# Patient Record
Sex: Female | Born: 1990 | Race: White | Hispanic: No | Marital: Single | Smoking: Former smoker
Health system: Southern US, Community
[De-identification: ages and names within clinical notes are randomized; demographics above are authoritative.]

## PROBLEM LIST (undated history)

## (undated) ENCOUNTER — Emergency Department (HOSPITAL_COMMUNITY): Admission: EM | Payer: Self-pay | Source: Home / Self Care

## (undated) DIAGNOSIS — N898 Other specified noninflammatory disorders of vagina: Principal | ICD-10-CM

## (undated) DIAGNOSIS — O26891 Other specified pregnancy related conditions, first trimester: Principal | ICD-10-CM

## (undated) DIAGNOSIS — Z309 Encounter for contraceptive management, unspecified: Secondary | ICD-10-CM

## (undated) DIAGNOSIS — N760 Acute vaginitis: Secondary | ICD-10-CM

## (undated) DIAGNOSIS — B9689 Other specified bacterial agents as the cause of diseases classified elsewhere: Secondary | ICD-10-CM

## (undated) DIAGNOSIS — R87629 Unspecified abnormal cytological findings in specimens from vagina: Secondary | ICD-10-CM

## (undated) DIAGNOSIS — O219 Vomiting of pregnancy, unspecified: Secondary | ICD-10-CM

## (undated) DIAGNOSIS — A749 Chlamydial infection, unspecified: Secondary | ICD-10-CM

## (undated) DIAGNOSIS — Z3490 Encounter for supervision of normal pregnancy, unspecified, unspecified trimester: Secondary | ICD-10-CM

## (undated) HISTORY — PX: NO PAST SURGERIES: SHX2092

## (undated) HISTORY — PX: CERVICAL BIOPSY  W/ LOOP ELECTRODE EXCISION: SUR135

## (undated) HISTORY — DX: Encounter for supervision of normal pregnancy, unspecified, unspecified trimester: Z34.90

## (undated) HISTORY — DX: Unspecified abnormal cytological findings in specimens from vagina: R87.629

## (undated) HISTORY — DX: Encounter for contraceptive management, unspecified: Z30.9

## (undated) HISTORY — DX: Other specified pregnancy related conditions, first trimester: O26.891

## (undated) HISTORY — DX: Other specified noninflammatory disorders of vagina: N89.8

## (undated) HISTORY — DX: Other specified bacterial agents as the cause of diseases classified elsewhere: N76.0

## (undated) HISTORY — DX: Vomiting of pregnancy, unspecified: O21.9

## (undated) HISTORY — DX: Other specified bacterial agents as the cause of diseases classified elsewhere: B96.89

---
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
## 2011-02-20 ENCOUNTER — Encounter (HOSPITAL_COMMUNITY): Payer: Self-pay | Admitting: *Deleted

## 2011-02-20 ENCOUNTER — Emergency Department (HOSPITAL_COMMUNITY)
Admission: EM | Admit: 2011-02-20 | Discharge: 2011-02-20 | Disposition: A | Discharge status: Home / Self Care | Payer: Medicaid Other | Attending: Emergency Medicine | Admitting: Emergency Medicine

## 2011-02-20 DIAGNOSIS — N760 Acute vaginitis: Secondary | ICD-10-CM | POA: Insufficient documentation

## 2011-02-20 DIAGNOSIS — A499 Bacterial infection, unspecified: Secondary | ICD-10-CM | POA: Insufficient documentation

## 2011-02-20 DIAGNOSIS — B9689 Other specified bacterial agents as the cause of diseases classified elsewhere: Secondary | ICD-10-CM | POA: Insufficient documentation

## 2011-02-20 DIAGNOSIS — J069 Acute upper respiratory infection, unspecified: Secondary | ICD-10-CM

## 2011-02-20 DIAGNOSIS — F17200 Nicotine dependence, unspecified, uncomplicated: Secondary | ICD-10-CM | POA: Insufficient documentation

## 2011-02-20 HISTORY — DX: Chlamydial infection, unspecified: A74.9

## 2011-02-20 LAB — POCT PREGNANCY, URINE: Preg Test, Ur: NEGATIVE

## 2011-02-20 LAB — URINALYSIS, ROUTINE W REFLEX MICROSCOPIC
Leukocytes, UA: NEGATIVE
Protein, ur: NEGATIVE mg/dL
Urobilinogen, UA: 0.2 mg/dL (ref 0.0–1.0)

## 2011-02-20 MED ORDER — GUAIFENESIN-CODEINE 100-10 MG/5ML PO SYRP: 10.0000 mL | ORAL_SOLUTION | Freq: Three times a day (TID) | ORAL | Status: AC | PRN

## 2011-02-20 MED ORDER — METRONIDAZOLE 500 MG PO TABS: 500.0000 mg | ORAL_TABLET | Freq: Two times a day (BID) | ORAL | Status: AC

## 2011-02-20 NOTE — ED Notes (Signed)
Pt states sore throat x 3 days. Migraine x 2 days, nausea at times but no vomiting. Bilateral ear pain. Pt states she has been treated for chlamydia recently and was unable to keep antibiotics down. Pt states she still has symptoms. NAD at this time.

## 2011-02-20 NOTE — ED Notes (Signed)
Discharge instructions given and reviewed with patient.  Prescriptions given for Flagyl and Robitussin AC; effects and use explained for each.  Patient verbalized understanding to complete all antibiotic and possible sedating effects of cough medication.  Patient ambulatory with steady gait at triage.

## 2011-02-20 NOTE — ED Provider Notes (Signed)
History     CSN: [id number] Arrival date & time: 02/20/2011  4:23 PM   First MD Initiated Contact with Patient 02/20/11 1658      Chief Complaint  Patient presents with  . Multiple complaints    (Consider location/radiation/quality/duration/timing/severity/associated sxs/prior treatment) HPI Comments: Patient has multiple complaints.  She c/o sore throat, intermittent headaches, and intermittent nausea for several days.  She also states that she thinks she has a persistent chlamydia infection.  Reports having a chronic vaginal discharge for "weeks".  States she was treated here several months ago for same but symptoms never improved.  She denies fever or vomiting or abd pain  Patient is a 20 y.o. female presenting with pharyngitis. The history is provided by the patient.  Sore Throat This is a new problem. The problem occurs 2 to 4 times per day. The problem has been unchanged. Associated symptoms include congestion, headaches, myalgias, nausea and a sore throat. Pertinent negatives include no abdominal pain, arthralgias, chest pain, chills, coughing, fatigue, fever, neck pain, numbness, rash, swollen glands, urinary symptoms, vomiting or weakness. The symptoms are aggravated by swallowing. She has tried nothing for the symptoms. The treatment provided no relief.    Past Medical History  Diagnosis Date  . Chlamydia     History reviewed. No pertinent past surgical history.  Family History  Problem Relation Age of Onset  . Hypertension Mother     History  Substance Use Topics  . Smoking status: Current Everyday Smoker  . Smokeless tobacco: Not on file  . Alcohol Use: Yes     Occ    OB History    Grav Para Term Preterm Abortions TAB SAB Ect Mult Living   1 1 1       1       Review of Systems  Constitutional: Negative for fever, chills, activity change, appetite change and fatigue.  HENT: Positive for ear pain, congestion and sore throat. Negative for trouble swallowing,  neck pain and neck stiffness.   Eyes: Negative for pain and visual disturbance.  Respiratory: Negative for cough, shortness of breath and wheezing.   Cardiovascular: Negative for chest pain and palpitations.  Gastrointestinal: Positive for nausea. Negative for vomiting, abdominal pain, diarrhea, constipation and blood in stool.  Genitourinary: Positive for vaginal discharge. Negative for dysuria, urgency, hematuria, flank pain, vaginal bleeding, difficulty urinating, genital sores and vaginal pain.  Musculoskeletal: Positive for myalgias. Negative for back pain and arthralgias.  Skin: Negative.  Negative for rash.  Neurological: Positive for headaches. Negative for dizziness, weakness and numbness.  Hematological: Negative for adenopathy. Does not bruise/bleed easily.  All other systems reviewed and are negative.    Allergies  Review of patient's allergies indicates no known allergies.  Home Medications   Current Outpatient Rx  Name Route Sig Dispense Refill  . IBUPROFEN 200 MG PO TABS Oral Take 200 mg by mouth every 6 (six) hours as needed. For pain       BP 104/63  Pulse 92  Temp(Src) 98.5 F (36.9 C) (Oral)  Resp 17  Ht 5\' 6"  (1.676 m)  Wt 120 lb (54.432 kg)  BMI 19.37 kg/m2  SpO2 100%  LMP 02/10/2011  Physical Exam  Nursing note and vitals reviewed. Constitutional: She is oriented to person, place, and time. She appears well-developed and well-nourished. No distress.  HENT:  Head: Normocephalic and atraumatic.  Mouth/Throat: Oropharynx is clear and moist.  Neck: Normal range of motion. Neck supple.  Cardiovascular: Normal rate, regular rhythm and  normal heart sounds.   Pulmonary/Chest: Effort normal and breath sounds normal. No respiratory distress. She exhibits no tenderness.  Abdominal: Soft. She exhibits no distension. There is no tenderness.  Genitourinary: Uterus normal. Cervix exhibits no motion tenderness, no discharge and no friability. Right adnexum displays  no mass and no tenderness. Left adnexum displays no mass and no tenderness. No erythema, tenderness or bleeding around the vagina. No foreign body around the vagina. Vaginal discharge found.       Cervical os is slosed.  No CMT, mild to moderate milky discharge in the vaginal vault.  No vaginal bleeding  Musculoskeletal: Normal range of motion. She exhibits no edema and no tenderness.  Lymphadenopathy:    She has no cervical adenopathy.  Neurological: She is alert and oriented to person, place, and time. No cranial nerve deficit. She exhibits normal muscle tone. Coordination normal.  Skin: Skin is warm and dry.  Psychiatric: She has a normal mood and affect.    ED Course  Procedures (including critical care time)   Results for orders placed during the hospital encounter of 02/20/11  URINALYSIS, ROUTINE W REFLEX MICROSCOPIC      Component Value Range   Color, Urine YELLOW  YELLOW    Appearance CLEAR  CLEAR    Specific Gravity, Urine >1.030 (*) 1.005 - 1.030    pH 6.0  5.0 - 8.0    Glucose, UA NEGATIVE  NEGATIVE (mg/dL)   Hgb urine dipstick NEGATIVE  NEGATIVE    Bilirubin Urine NEGATIVE  NEGATIVE    Ketones, ur NEGATIVE  NEGATIVE (mg/dL)   Protein, ur NEGATIVE  NEGATIVE (mg/dL)   Urobilinogen, UA 0.2  0.0 - 1.0 (mg/dL)   Nitrite NEGATIVE  NEGATIVE    Leukocytes, UA NEGATIVE  NEGATIVE   POCT PREGNANCY, URINE      Component Value Range   Preg Test, Ur NEGATIVE    WET PREP, GENITAL      Component Value Range   Yeast, Wet Prep NONE SEEN  NONE SEEN    Trich, Wet Prep NONE SEEN  NONE SEEN    Clue Cells, Wet Prep FEW (*) NONE SEEN    WBC, Wet Prep HPF POC FEW (*) NONE SEEN         MDM     7:10 PM patient is alert, non-toxic appearing.  Abd is soft, NT.  No peritoneal signs.  GC and chlamydia culture pending. I have reviewed patient's medical records and previous GC and chlaymdia report.  Report was negative.  She agrees to f/u with GYN.  I have advised her that she will be  contacted if today's culture reports are positive.  I will treat her for BV today.    Patient / Family / Caregiver understand and agree with initial ED impression and plan with expectations set for ED visit.   The patient appears reasonably screened and/or stabilized for discharge and I doubt any other medical condition or other [facility] requiring further screening, evaluation, or treatment in the ED at this time prior to discharge.         Cortnee Steinmiller L. [place], [place] 02/25/11 2130

## 2011-02-28 NOTE — ED Provider Notes (Signed)
Medical screening examination/treatment/procedure(s) were performed by non-physician practitioner and as supervising physician I was immediately available for consultation/collaboration.  Donnetta Hutching, MD 02/28/11 [phone]

## 2012-06-23 ENCOUNTER — Emergency Department (HOSPITAL_COMMUNITY)
Admission: EM | Admit: 2012-06-23 | Discharge: 2012-06-23 | Disposition: A | Discharge status: Home / Self Care | Payer: Self-pay | Attending: Emergency Medicine | Admitting: Emergency Medicine

## 2012-06-23 ENCOUNTER — Encounter (HOSPITAL_COMMUNITY): Payer: Self-pay | Admitting: Emergency Medicine

## 2012-06-23 DIAGNOSIS — F17200 Nicotine dependence, unspecified, uncomplicated: Secondary | ICD-10-CM | POA: Insufficient documentation

## 2012-06-23 DIAGNOSIS — S0181XA Laceration without foreign body of other part of head, initial encounter: Secondary | ICD-10-CM

## 2012-06-23 DIAGNOSIS — S0180XA Unspecified open wound of other part of head, initial encounter: Secondary | ICD-10-CM | POA: Insufficient documentation

## 2012-06-23 DIAGNOSIS — Z8619 Personal history of other infectious and parasitic diseases: Secondary | ICD-10-CM | POA: Insufficient documentation

## 2012-06-23 MED ORDER — LIDOCAINE-EPINEPHRINE-TETRACAINE (LET) SOLUTION
NASAL | Status: AC
  Administered 2012-06-23: 3 mL
  Filled 2012-06-23: qty 3

## 2012-06-23 NOTE — ED Notes (Signed)
dermabond to bedside for bryant pa to use on pt.
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
## 2012-06-23 NOTE — ED Notes (Signed)
Pt out to desk multiple times for discharge papers. Pa/md notified. Walked out un-aided after receiving d/c papers.

## 2012-06-25 NOTE — ED Provider Notes (Signed)
Medical screening examination/treatment/procedure(s) were conducted as a shared visit with non-physician practitioner(s) and myself.  I personally evaluated the patient during the encounter.  Laceration repair by physician's assistant.  Otherwise patient evaluated by self.  Donnetta Hutching, MD 06/25/12 [phone]

## 2012-11-29 ENCOUNTER — Other Ambulatory Visit: Payer: Self-pay | Admitting: Obstetrics & Gynecology

## 2012-11-29 DIAGNOSIS — O3680X0 Pregnancy with inconclusive fetal viability, not applicable or unspecified: Secondary | ICD-10-CM

## 2012-12-04 ENCOUNTER — Ambulatory Visit (INDEPENDENT_AMBULATORY_CARE_PROVIDER_SITE_OTHER): Payer: Medicaid Other | Admitting: Adult Health

## 2012-12-04 ENCOUNTER — Ambulatory Visit (INDEPENDENT_AMBULATORY_CARE_PROVIDER_SITE_OTHER): Payer: Medicaid Other

## 2012-12-04 ENCOUNTER — Encounter: Payer: Self-pay | Admitting: Adult Health

## 2012-12-04 VITALS — BP 110/60 | Ht 65.0 in | Wt 112.0 lb

## 2012-12-04 DIAGNOSIS — O26849 Uterine size-date discrepancy, unspecified trimester: Secondary | ICD-10-CM

## 2012-12-04 DIAGNOSIS — B9689 Other specified bacterial agents as the cause of diseases classified elsewhere: Secondary | ICD-10-CM

## 2012-12-04 DIAGNOSIS — Z3480 Encounter for supervision of other normal pregnancy, unspecified trimester: Secondary | ICD-10-CM | POA: Insufficient documentation

## 2012-12-04 DIAGNOSIS — O26891 Other specified pregnancy related conditions, first trimester: Secondary | ICD-10-CM | POA: Insufficient documentation

## 2012-12-04 DIAGNOSIS — N898 Other specified noninflammatory disorders of vagina: Secondary | ICD-10-CM | POA: Insufficient documentation

## 2012-12-04 DIAGNOSIS — O2390 Unspecified genitourinary tract infection in pregnancy, unspecified trimester: Secondary | ICD-10-CM

## 2012-12-04 DIAGNOSIS — Z3490 Encounter for supervision of normal pregnancy, unspecified, unspecified trimester: Secondary | ICD-10-CM

## 2012-12-04 DIAGNOSIS — O219 Vomiting of pregnancy, unspecified: Secondary | ICD-10-CM

## 2012-12-04 DIAGNOSIS — N760 Acute vaginitis: Secondary | ICD-10-CM

## 2012-12-04 DIAGNOSIS — O3680X0 Pregnancy with inconclusive fetal viability, not applicable or unspecified: Secondary | ICD-10-CM

## 2012-12-04 DIAGNOSIS — O210 Mild hyperemesis gravidarum: Secondary | ICD-10-CM

## 2012-12-04 HISTORY — DX: Vomiting of pregnancy, unspecified: O21.9

## 2012-12-04 LAB — POCT URINALYSIS DIPSTICK
Blood, UA: NEGATIVE
Ketones, UA: NEGATIVE
Protein, UA: NEGATIVE

## 2012-12-04 LAB — POCT WET PREP (WET MOUNT)
Trichomonas Wet Prep HPF POC: NEGATIVE
WBC, Wet Prep HPF POC: POSITIVE

## 2012-12-04 MED ORDER — PROMETHAZINE HCL 25 MG PO TABS: 25.0000 mg | ORAL_TABLET | Freq: Four times a day (QID) | ORAL | Status: DC | PRN

## 2012-12-04 NOTE — Progress Notes (Signed)
U/S(9+0wks)-single IUP with +FCA noted, CRL c/w 9+0 wks EDD 07/09/2013, cx long and closed, bilateral adnexa wnl

## 2012-12-04 NOTE — Patient Instructions (Addendum)
Nausea and Vomiting Nausea is a sick feeling that often comes before throwing up (vomiting). Vomiting is a reflex where stomach contents come out of your mouth. Vomiting can cause severe loss of body fluids (dehydration). Children and elderly adults can become dehydrated quickly, especially if they also have diarrhea. Nausea and vomiting are symptoms of a condition or disease. It is important to find the cause of your symptoms. CAUSES   Direct irritation of the stomach lining. This irritation can result from increased acid production (gastroesophageal reflux disease), infection, food poisoning, taking certain medicines (such as nonsteroidal anti-inflammatory drugs), alcohol use, or tobacco use.  Signals from the brain.These signals could be caused by a headache, heat exposure, an inner ear disturbance, increased pressure in the brain from injury, infection, a tumor, or a concussion, pain, emotional stimulus, or metabolic problems.  An obstruction in the gastrointestinal tract (bowel obstruction).  Illnesses such as diabetes, hepatitis, gallbladder problems, appendicitis, kidney problems, cancer, sepsis, atypical symptoms of a heart attack, or eating disorders.  Medical treatments such as chemotherapy and radiation.  Receiving medicine that makes you sleep (general anesthetic) during surgery. DIAGNOSIS Your caregiver may ask for tests to be done if the problems do not improve after a few days. Tests may also be done if symptoms are severe or if the reason for the nausea and vomiting is not clear. Tests may include:  Urine tests.  Blood tests.  Stool tests.  Cultures (to look for evidence of infection).  X-rays or other imaging studies. Test results can help your caregiver make decisions about treatment or the need for additional tests. TREATMENT You need to stay well hydrated. Drink frequently but in small amounts.You may wish to drink water, sports drinks, clear broth, or eat frozen  ice pops or gelatin dessert to help stay hydrated.When you eat, eating slowly may help prevent nausea.There are also some antinausea medicines that may help prevent nausea. HOME CARE INSTRUCTIONS   Take all medicine as directed by your caregiver.  If you do not have an appetite, do not force yourself to eat. However, you must continue to drink fluids.  If you have an appetite, eat a normal diet unless your caregiver tells you differently.  Eat a variety of complex carbohydrates (rice, wheat, potatoes, bread), lean meats, yogurt, fruits, and vegetables.  Avoid high-fat foods because they are more difficult to digest.  Drink enough water and fluids to keep your urine clear or pale yellow.  If you are dehydrated, ask your caregiver for specific rehydration instructions. Signs of dehydration may include:  Severe thirst.  Dry lips and mouth.  Dizziness.  Dark urine.  Decreasing urine frequency and amount.  Confusion.  Rapid breathing or pulse. SEEK IMMEDIATE MEDICAL CARE IF:   You have blood or brown flecks (like coffee grounds) in your vomit.  You have black or bloody stools.  You have a severe headache or stiff neck.  You are confused.  You have severe abdominal pain.  You have chest pain or trouble breathing.  You do not urinate at least once every 8 hours.  You develop cold or clammy skin.  You continue to vomit for longer than 24 to 48 hours.  You have a fever. MAKE SURE YOU:   Understand these instructions.  Will watch your condition.  Will get help right away if you are not doing well or get worse. Document Released: 04/17/2005 Document Revised: 07/10/2011 Document Reviewed: 09/14/2010 [facility] Patient Information 2014 [place], [place]. Bacterial Vaginosis Bacterial vaginosis (BV)  is a vaginal infection where the normal balance of bacteria in the vagina is disrupted. The normal balance is then replaced by an overgrowth of certain bacteria. There are  several different kinds of bacteria that can cause BV. BV is the most common vaginal infection in women of childbearing age. CAUSES   The cause of BV is not fully understood. BV develops when there is an increase or imbalance of harmful bacteria.  Some activities or behaviors can upset the normal balance of bacteria in the vagina and put women at increased risk including:  Having a new sex partner or multiple sex partners.  Douching.  Using an intrauterine device (IUD) for contraception.  It is not clear what role sexual activity plays in the development of BV. However, women that have never had sexual intercourse are rarely infected with BV. Women do not get BV from toilet seats, bedding, swimming pools or from touching objects around them.  SYMPTOMS   Grey vaginal discharge.  A fish-like odor with discharge, especially after sexual intercourse.  Itching or burning of the vagina and vulva.  Burning or pain with urination.  Some women have no signs or symptoms at all. DIAGNOSIS  Your caregiver must examine the vagina for signs of BV. Your caregiver will perform lab tests and look at the sample of vaginal fluid through a microscope. They will look for bacteria and abnormal cells (clue cells), a pH test higher than 4.5, and a positive amine test all associated with BV.  RISKS AND COMPLICATIONS   Pelvic inflammatory disease (PID).  Infections following gynecology surgery.  Developing HIV.  Developing herpes virus. TREATMENT  Sometimes BV will clear up without treatment. However, all women with symptoms of BV should be treated to avoid complications, especially if gynecology surgery is planned. Female partners generally do not need to be treated. However, BV may spread between female sex partners so treatment is helpful in preventing a recurrence of BV.   BV may be treated with antibiotics. The antibiotics come in either pill or vaginal cream forms. Either can be used with  nonpregnant or pregnant women, but the recommended dosages differ. These antibiotics are not harmful to the baby.  BV can recur after treatment. If this happens, a second round of antibiotics will often be prescribed.  Treatment is important for pregnant women. If not treated, BV can cause a premature delivery, especially for a pregnant woman who had a premature birth in the past. All pregnant women who have symptoms of BV should be checked and treated.  For chronic reoccurrence of BV, treatment with a type of prescribed gel vaginally twice a week is helpful. HOME CARE INSTRUCTIONS   Finish all medication as directed by your caregiver.  Do not have sex until treatment is completed.  Tell your sexual partner that you have a vaginal infection. They should see their caregiver and be treated if they have problems, such as a mild rash or itching.  Practice safe sex. Use condoms. Only have 1 sex partner. PREVENTION  Basic prevention steps can help reduce the risk of upsetting the natural balance of bacteria in the vagina and developing BV:  Do not have sexual intercourse (be abstinent).  Do not douche.  Use all of the medicine prescribed for treatment of BV, even if the signs and symptoms go away.  Tell your sex partner if you have BV. That way, they can be treated, if needed, to prevent reoccurrence. SEEK MEDICAL CARE IF:   Your symptoms are  not improving after 3 days of treatment.  You have increased discharge, pain, or fever. MAKE SURE YOU:   Understand these instructions.  Will watch your condition.  Will get help right away if you are not doing well or get worse. FOR MORE INFORMATION  Division of STD Prevention (DSTDP), Centers for Disease Control and Prevention: [employer] American Social Health Association (ASHA): www.ashastd.org  Document Released: 04/17/2005 Document Revised: 07/10/2011 Document Reviewed: 10/08/2008 [facility] Patient Information 2014 [place],  [place]. Pregnancy - First Trimester During sexual intercourse, millions of sperm go into the vagina. Only 1 sperm will penetrate and fertilize the female egg while it is in the Fallopian tube. One week later, the fertilized egg implants into the wall of the uterus. An embryo begins to develop into a baby. At 6 to 8 weeks, the eyes and face are formed and the heartbeat can be seen on ultrasound. At the end of 12 weeks (first trimester), all the baby's organs are formed. Now that you are pregnant, you will want to do everything you can to have a healthy baby. Two of the most important things are to get good prenatal care and follow your caregiver's instructions. Prenatal care is all the medical care you receive before the baby's birth. It is given to prevent, find, and treat problems during the pregnancy and childbirth. PRENATAL EXAMS  During prenatal visits, your weight, blood pressure, and urine are checked. This is done to make sure you are healthy and progressing normally during the pregnancy.  A pregnant woman should gain 25 to 35 pounds during the pregnancy. However, if you are overweight or underweight, your caregiver will advise you regarding your weight.  Your caregiver will ask and answer questions for you.  Blood work, cervical cultures, other necessary tests, and a Pap test are done during your prenatal exams. These tests are done to check on your health and the probable health of your baby. Tests are strongly recommended and done for HIV with your permission. This is the virus that causes AIDS. These tests are done because medicines can be given to help prevent your baby from being born with this infection should you have been infected without knowing it. Blood work is also used to find out your blood type, previous infections, and follow your blood levels (hemoglobin).  Low hemoglobin (anemia) is common during pregnancy. Iron and vitamins are given to help prevent this. Later in the pregnancy,  blood tests for diabetes will be done along with any other tests if any problems develop.  You may need other tests to make sure you and the baby are doing well. CHANGES DURING THE FIRST TRIMESTER  Your body goes through many changes during pregnancy. They vary from person to person. Talk to your caregiver about changes you notice and are concerned about. Changes can include:  Your menstrual period stops.  The egg and sperm carry the genes that determine what you look like. Genes from you and your partner are forming a baby. The female genes determine whether the baby is a boy or a girl.  Your body increases in girth and you may feel bloated.  Feeling sick to your stomach (nauseous) and throwing up (vomiting). If the vomiting is uncontrollable, call your caregiver.  Your breasts will begin to enlarge and become tender.  Your nipples may stick out more and become darker.  The need to urinate more. Painful urination may mean you have a bladder infection.  Tiring easily.  Loss of appetite.  Cravings  for certain kinds of food.  At first, you may gain or lose a couple of pounds.  You may have changes in your emotions from day to day (excited to be pregnant or concerned something may go wrong with the pregnancy and baby).  You may have more vivid and strange dreams. HOME CARE INSTRUCTIONS   It is very important to avoid all smoking, alcohol and non-prescribed drugs during your pregnancy. These affect the formation and growth of the baby. Avoid chemicals while pregnant to ensure the delivery of a healthy infant.  Start your prenatal visits by the 12th week of pregnancy. They are usually scheduled monthly at first, then more often in the last 2 months before delivery. Keep your caregiver's appointments. Follow your caregiver's instructions regarding medicine use, blood and lab tests, exercise, and diet.  During pregnancy, you are providing food for you and your baby. Eat regular,  well-balanced meals. Choose foods such as meat, fish, milk and other low fat dairy products, vegetables, fruits, and whole-grain breads and cereals. Your caregiver will tell you of the ideal weight gain.  You can help morning sickness by keeping soda crackers at the bedside. Eat a couple before arising in the morning. You may want to use the crackers without salt on them.  Eating 4 to 5 small meals rather than 3 large meals a day also may help the nausea and vomiting.  Drinking liquids between meals instead of during meals also seems to help nausea and vomiting.  A physical sexual relationship may be continued throughout pregnancy if there are no other problems. Problems may be early (premature) leaking of amniotic fluid from the membranes, vaginal bleeding, or belly (abdominal) pain.  Exercise regularly if there are no restrictions. Check with your caregiver or physical therapist if you are unsure of the safety of some of your exercises. Greater weight gain will occur in the last 2 trimesters of pregnancy. Exercising will help:  Control your weight.  Keep you in shape.  Prepare you for labor and delivery.  Help you lose your pregnancy weight after you deliver your baby.  Wear a good support or jogging bra for breast tenderness during pregnancy. This may help if worn during sleep too.  Ask when prenatal classes are available. Begin classes when they are offered.  Do not use hot tubs, steam rooms, or saunas.  Wear your seat belt when driving. This protects you and your baby if you are in an accident.  Avoid raw meat, uncooked cheese, cat litter boxes, and soil used by cats throughout the pregnancy. These carry germs that can cause birth defects in the baby.  The first trimester is a good time to visit your dentist for your dental health. Getting your teeth cleaned is okay. Use a softer toothbrush and brush gently during pregnancy.  Ask for help if you have financial, counseling, or  nutritional needs during pregnancy. Your caregiver will be able to offer counseling for these needs as well as refer you for other special needs.  Do not take any medicines or herbs unless told by your caregiver.  Inform your caregiver if there is any mental or physical domestic violence.  Make a list of emergency phone numbers of family, friends, hospital, and police and fire departments.  Write down your questions. Take them to your prenatal visit.  Do not douche.  Do not cross your legs.  If you have to stand for long periods of time, rotate you feet or take small steps in  a circle.  You may have more vaginal secretions that may require a sanitary pad. Do not use tampons or scented sanitary pads. MEDICINES AND DRUG USE IN PREGNANCY  Take prenatal vitamins as directed. The vitamin should contain 1 milligram of folic acid. Keep all vitamins out of reach of children. Only a couple vitamins or tablets containing iron may be fatal to a baby or young child when ingested.  Avoid use of all medicines, including herbs, over-the-counter medicines, not prescribed or suggested by your caregiver. Only take over-the-counter or prescription medicines for pain, discomfort, or fever as directed by your caregiver. Do not use aspirin, ibuprofen, or naproxen unless directed by your caregiver.  Let your caregiver also know about herbs you may be using.  Alcohol is related to a number of birth defects. This includes fetal alcohol syndrome. All alcohol, in any form, should be avoided completely. Smoking will cause low birth rate and premature babies.  Street or illegal drugs are very harmful to the baby. They are absolutely forbidden. A baby born to an addicted mother will be addicted at birth. The baby will go through the same withdrawal an adult does.  Let your caregiver know about any medicines that you have to take and for what reason you take them. SEEK MEDICAL CARE IF:  You have any concerns or  worries during your pregnancy. It is better to call with your questions if you feel they cannot wait, rather than worry about them. SEEK IMMEDIATE MEDICAL CARE IF:   An unexplained oral temperature above 102 F (38.9 C) develops, or as your caregiver suggests.  You have leaking of fluid from the vagina (birth canal). If leaking membranes are suspected, take your temperature and inform your caregiver of this when you call.  There is vaginal spotting or bleeding. Notify your caregiver of the amount and how many pads are used.  You develop a bad smelling vaginal discharge with a change in the color.  You continue to feel sick to your stomach (nauseated) and have no relief from remedies suggested. You vomit blood or coffee ground-like materials.  You lose more than 2 pounds of weight in 1 week.  You gain more than 2 pounds of weight in 1 week and you notice swelling of your face, hands, feet, or legs.  You gain 5 pounds or more in 1 week (even if you do not have swelling of your hands, face, legs, or feet).  You get exposed to [place] measles and have never had them.  You are exposed to fifth disease or chickenpox.  You develop belly (abdominal) pain. Round ligament discomfort is a common non-cancerous (benign) cause of abdominal pain in pregnancy. Your caregiver still must evaluate this.  You develop headache, fever, diarrhea, pain with urination, or shortness of breath.  You fall or are in a car accident or have any kind of trauma.  There is mental or physical violence in your home. Document Released: 04/11/2001 Document Revised: 01/10/2012 Document Reviewed: 10/13/2008 [facility] Patient Information 2014 [place], [place]. Eat often Try phenergan Return in 1 week for new oB

## 2012-12-04 NOTE — Progress Notes (Signed)
Subjective:     Patient ID: Jennifer [name], female   DOB: [birth year], 22 y.o.   MRN: [id number]  HPI Jennifer [name] is a 22 year old white female here for dating [place] and complains of vaginal discharge and N/V.She has a history of BV.The [place] showed her to be [redacted] weeks pregnant with EDD of 07/09/13.  Review of Systems Positives in HPI Reviewed past medical,surgical, social and family history. Reviewed medications and allergies.     Objective:   Physical Exam BP 110/60  Ht 5\' 5"  (1.651 m)  Wt 112 lb (50.803 kg)  BMI 18.64 kg/m2  LMP 10/20/2012   Urine dipstick negative, Skin warm and dry.Pelvic: external genitalia is normal in appearance, vagina: white frothy discharge with odor, cervix:smooth and bulbous, uterus: 9 week size,  non tender, adnexa: no masses or tenderness noted. Wet prep: + for clue cells and +WBCs. GC/CHL obtained.  Assessment:     Vaginal discharge in first trimester Nausea and vomiting  BV   Pregnant  Plan:      RX Phenergan 25 mg #30 1 every 6 hours prn N/V with 1 refill Return in 1 week for new ob visit Will treat BV after 12 weeks Review handouts on first trimester,BV and N/V

## 2012-12-16 ENCOUNTER — Encounter: Payer: Self-pay | Admitting: Adult Health

## 2012-12-16 ENCOUNTER — Ambulatory Visit (INDEPENDENT_AMBULATORY_CARE_PROVIDER_SITE_OTHER): Payer: Medicaid Other | Admitting: Adult Health

## 2012-12-16 VITALS — BP 96/60 | Wt 112.0 lb

## 2012-12-16 DIAGNOSIS — Z331 Pregnant state, incidental: Secondary | ICD-10-CM

## 2012-12-16 DIAGNOSIS — Z3481 Encounter for supervision of other normal pregnancy, first trimester: Secondary | ICD-10-CM

## 2012-12-16 DIAGNOSIS — Z3490 Encounter for supervision of normal pregnancy, unspecified, unspecified trimester: Secondary | ICD-10-CM

## 2012-12-16 DIAGNOSIS — Z3480 Encounter for supervision of other normal pregnancy, unspecified trimester: Secondary | ICD-10-CM

## 2012-12-16 DIAGNOSIS — Z1389 Encounter for screening for other disorder: Secondary | ICD-10-CM

## 2012-12-16 LAB — POCT URINALYSIS DIPSTICK
Blood, UA: NEGATIVE
Glucose, UA: NEGATIVE
Nitrite, UA: NEGATIVE

## 2012-12-16 LAB — CBC
MCHC: 34 g/dL (ref 30.0–36.0)
Platelets: 200 10*3/uL (ref 150–400)
RDW: 13.5 % (ref 11.5–15.5)

## 2012-12-16 NOTE — Progress Notes (Signed)
  Subjective:    Jennifer [name] is a 22 y.o. G34P1001 Caucasian female at [redacted]w[redacted]d by [place] being seen today for her first obstetrical visit.  Her obstetrical history is significant for BV. Will treat after 12 weeks. Pregnancy history fully reviewed.   Patient reports nausea.Has phenergan but using it less  Filed Vitals:   12/16/12 1438  BP: 96/60  Weight: 112 lb (50.803 kg)    HISTORY: OB History  Gravida Para Term Preterm AB SAB TAB Ectopic Multiple Living  2 1 1       1     # Outcome Date GA Lbr Len/2nd Weight Sex Delivery Anes PTL Lv  2 CUR           1 TRM 10/29/08 [redacted]w[redacted]d  8 lb 0.1 oz (3.632 kg) F SVD   Y     Past Medical History  Diagnosis Date  . Chlamydia   . BV (bacterial vaginosis)   . Nausea and vomiting in pregnancy prior to [redacted] weeks gestation 12/04/2012  . Vaginal discharge in pregnancy in first trimester 12/04/2012  . Pregnant 12/04/2012   History reviewed. No pertinent past surgical history. Family History  Problem Relation Age of Onset  . Hypertension Mother      Exam    Pelvic Exam:    Perineum: deferred   Vulva: deferred   Vagina:  deferred   Uterus   11 weeks     Cervix: normaldeferred   Adnexa: Not palpable   Urinary: deferred    System:     Skin: normal coloration and turgor, no rashes    Neurologic: oriented, normal mood   Extremities: normal strength, tone, and muscle mass   HEENT PERRLA   Mouth/Teeth mucous membranes moist   Cardiovascular: regular rate and rhythm   Respiratory:  appears well, vitals normal, no respiratory distress, acyanotic, normal RR   Abdomen: soft, non-tender       Assessment:    Pregnancy: G2P1001 Patient Active Problem List   Diagnosis Date Noted  . Nausea and vomiting in pregnancy prior to [redacted] weeks gestation 12/04/2012  . Vaginal discharge in pregnancy in first trimester 12/04/2012  . BV (bacterial vaginosis) 12/04/2012  . Pregnant 12/04/2012      [redacted]w[redacted]d G2P1001 New OB visit    Plan:     Initial labs  drawn Continue prenatal vitamins Problem list reviewed and updated Reviewed n/v relief measures and warning s/s to report Reviewed recommended weight gain based on pre-gravid BMI Encouraged well-balanced diet Genetic Screening discussed Integrated Screen: requested Cystic fibrosis screening discussed requested Ultrasound discussed; fetal survey: requested Follow up in 2 weeks fot IT/NT and see Kim and treat for BV then.  Jordanne Elsbury 12/16/2012 3:42 PM

## 2012-12-16 NOTE — Progress Notes (Signed)
Pt here today for New OB appointment. Pt states she is having pain in her lower left side/abdomin. Pt denies any other problems or concerns at this time. Pt given CCNC form and lab consents to read over and sign.

## 2012-12-16 NOTE — Patient Instructions (Addendum)
Pregnancy - Second Trimester The second trimester of pregnancy (3 to 6 months) is a period of rapid growth for you and your baby. At the end of the sixth month, your baby is about 9 inches long and weighs 1 1/2 pounds. You will begin to feel the baby move between 18 and 20 weeks of the pregnancy. This is called quickening. Weight gain is faster. A clear fluid (colostrum) may leak out of your breasts. You may feel small contractions of the womb (uterus). This is known as false labor or Braxton-Hicks contractions. This is like a practice for labor when the baby is ready to be born. Usually, the problems with morning sickness have usually passed by the end of your first trimester. Some women develop small dark blotches (called cholasma, mask of pregnancy) on their face that usually goes away after the baby is born. Exposure to the sun makes the blotches worse. Acne may also develop in some pregnant women and pregnant women who have acne, may find that it goes away. PRENATAL EXAMS  Blood work may continue to be done during prenatal exams. These tests are done to check on your health and the probable health of your baby. Blood work is used to follow your blood levels (hemoglobin). Anemia (low hemoglobin) is common during pregnancy. Iron and vitamins are given to help prevent this. You will also be checked for diabetes between 24 and 28 weeks of the pregnancy. Some of the previous blood tests may be repeated.  The size of the uterus is measured during each visit. This is to make sure that the baby is continuing to grow properly according to the dates of the pregnancy.  Your blood pressure is checked every prenatal visit. This is to make sure you are not getting toxemia.  Your urine is checked to make sure you do not have an infection, diabetes or protein in the urine.  Your weight is checked often to make sure gains are happening at the suggested rate. This is to ensure that both you and your baby are growing  normally.  Sometimes, an ultrasound is performed to confirm the proper growth and development of the baby. This is a test which bounces harmless sound waves off the baby so your caregiver can more accurately determine due dates. Sometimes, a test is done on the amniotic fluid surrounding the baby. This test is called an amniocentesis. The amniotic fluid is obtained by sticking a needle into the belly (abdomen). This is done to check the chromosomes in instances where there is a concern about possible genetic problems with the baby. It is also sometimes done near the end of pregnancy if an early delivery is required. In this case, it is done to help make sure the baby's lungs are mature enough for the baby to live outside of the womb. CHANGES OCCURING IN THE SECOND TRIMESTER OF PREGNANCY Your body goes through many changes during pregnancy. They vary from person to person. Talk to your caregiver about changes you notice that you are concerned about.  During the second trimester, you will likely have an increase in your appetite. It is normal to have cravings for certain foods. This varies from person to person and pregnancy to pregnancy.  Your lower abdomen will begin to bulge.  You may have to urinate more often because the uterus and baby are pressing on your bladder. It is also common to get more bladder infections during pregnancy. You can help this by drinking lots of fluids  and emptying your bladder before and after intercourse.  You may begin to get stretch marks on your hips, abdomen, and breasts. These are normal changes in the body during pregnancy. There are no exercises or medicines to take that prevent this change.  You may begin to develop swollen and bulging veins (varicose veins) in your legs. Wearing support hose, elevating your feet for 15 minutes, 3 to 4 times a day and limiting salt in your diet helps lessen the problem.  Heartburn may develop as the uterus grows and pushes up  against the stomach. Antacids recommended by your caregiver helps with this problem. Also, eating smaller meals 4 to 5 times a day helps.  Constipation can be treated with a stool softener or adding bulk to your diet. Drinking lots of fluids, and eating vegetables, fruits, and whole grains are helpful.  Exercising is also helpful. If you have been very active up until your pregnancy, most of these activities can be continued during your pregnancy. If you have been less active, it is helpful to start an exercise program such as walking.  Hemorrhoids may develop at the end of the second trimester. Warm sitz baths and hemorrhoid cream recommended by your caregiver helps hemorrhoid problems.  Backaches may develop during this time of your pregnancy. Avoid heavy lifting, wear low heal shoes, and practice good posture to help with backache problems.  Some pregnant women develop tingling and numbness of their hand and fingers because of swelling and tightening of ligaments in the wrist (carpel tunnel syndrome). This goes away after the baby is born.  As your breasts enlarge, you may have to get a bigger bra. Get a comfortable, cotton, support bra. Do not get a nursing bra until the last month of the pregnancy if you will be nursing the baby.  You may get a dark line from your belly button to the pubic area called the linea nigra.  You may develop rosy cheeks because of increase blood flow to the face.  You may develop spider looking lines of the face, neck, arms, and chest. These go away after the baby is born. HOME CARE INSTRUCTIONS   It is extremely important to avoid all smoking, herbs, alcohol, and unprescribed drugs during your pregnancy. These chemicals affect the formation and growth of the baby. Avoid these chemicals throughout the pregnancy to ensure the delivery of a healthy infant.  Most of your home care instructions are the same as suggested for the first trimester of your pregnancy.  Keep your caregiver's appointments. Follow your caregiver's instructions regarding medicine use, exercise, and diet.  During pregnancy, you are providing food for you and your baby. Continue to eat regular, well-balanced meals. Choose foods such as meat, fish, milk and other low fat dairy products, vegetables, fruits, and whole-grain breads and cereals. Your caregiver will tell you of the ideal weight gain.  A physical sexual relationship may be continued up until near the end of pregnancy if there are no other problems. Problems could include early (premature) leaking of amniotic fluid from the membranes, vaginal bleeding, abdominal pain, or other medical or pregnancy problems.  Exercise regularly if there are no restrictions. Check with your caregiver if you are unsure of the safety of some of your exercises. The greatest weight gain will occur in the last 2 trimesters of pregnancy. Exercise will help you:  Control your weight.  Get you in shape for labor and delivery.  Lose weight after you have the baby.  Wear  a good support or jogging bra for breast tenderness during pregnancy. This may help if worn during sleep. Pads or tissues may be used in the bra if you are leaking colostrum.  Do not use hot tubs, steam rooms or saunas throughout the pregnancy.  Wear your seat belt at all times when driving. This protects you and your baby if you are in an accident.  Avoid raw meat, uncooked cheese, cat litter boxes, and soil used by cats. These carry germs that can cause birth defects in the baby.  The second trimester is also a good time to visit your dentist for your dental health if this has not been done yet. Getting your teeth cleaned is okay. Use a soft toothbrush. Brush gently during pregnancy.  It is easier to leak urine during pregnancy. Tightening up and strengthening the pelvic muscles will help with this problem. Practice stopping your urination while you are going to the bathroom.  These are the same muscles you need to strengthen. It is also the muscles you would use as if you were trying to stop from passing gas. You can practice tightening these muscles up 10 times a set and repeating this about 3 times per day. Once you know what muscles to tighten up, do not perform these exercises during urination. It is more likely to contribute to an infection by backing up the urine.  Ask for help if you have financial, counseling, or nutritional needs during pregnancy. Your caregiver will be able to offer counseling for these needs as well as refer you for other special needs.  Your skin may become oily. If so, wash your face with mild soap, use non-greasy moisturizer and oil or cream based makeup. MEDICINES AND DRUG USE IN PREGNANCY  Take prenatal vitamins as directed. The vitamin should contain 1 milligram of folic acid. Keep all vitamins out of reach of children. Only a couple vitamins or tablets containing iron may be fatal to a baby or young child when ingested.  Avoid use of all medicines, including herbs, over-the-counter medicines, not prescribed or suggested by your caregiver. Only take over-the-counter or prescription medicines for pain, discomfort, or fever as directed by your caregiver. Do not use aspirin.  Let your caregiver also know about herbs you may be using.  Alcohol is related to a number of birth defects. This includes fetal alcohol syndrome. All alcohol, in any form, should be avoided completely. Smoking will cause low birth rate and premature babies.  Street or illegal drugs are very harmful to the baby. They are absolutely forbidden. A baby born to an addicted mother will be addicted at birth. The baby will go through the same withdrawal an adult does. SEEK MEDICAL CARE IF:  You have any concerns or worries during your pregnancy. It is better to call with your questions if you feel they cannot wait, rather than worry about them. SEEK IMMEDIATE MEDICAL CARE  IF:   An unexplained oral temperature above 102 F (38.9 C) develops, or as your caregiver suggests.  You have leaking of fluid from the vagina (birth canal). If leaking membranes are suspected, take your temperature and tell your caregiver of this when you call.  There is vaginal spotting, bleeding, or passing clots. Tell your caregiver of the amount and how many pads are used. Light spotting in pregnancy is common, especially following intercourse.  You develop a bad smelling vaginal discharge with a change in the color from clear to white.  You continue to feel  sick to your stomach (nauseated) and have no relief from remedies suggested. You vomit blood or coffee ground-like materials.  You lose more than 2 pounds of weight or gain more than 2 pounds of weight over 1 week, or as suggested by your caregiver.  You notice swelling of your face, hands, feet, or legs.  You get exposed to [place] measles and have never had them.  You are exposed to fifth disease or chickenpox.  You develop belly (abdominal) pain. Round ligament discomfort is a common non-cancerous (benign) cause of abdominal pain in pregnancy. Your caregiver still must evaluate you.  You develop a bad headache that does not go away.  You develop fever, diarrhea, pain with urination, or shortness of breath.  You develop visual problems, blurry, or double vision.  You fall or are in a car accident or any kind of trauma.  There is mental or physical violence at home. Document Released: 04/11/2001 Document Revised: 01/10/2012 Document Reviewed: 10/14/2008 [facility] Patient Information 2014 [place], [place]. Return in 2 weeks for IT/NT and see Selena Batten and will treat for BV then

## 2012-12-17 LAB — VARICELLA ZOSTER ANTIBODY, IGG: Varicella IgG: 1775 Index — ABNORMAL HIGH (ref <135.00)

## 2012-12-17 LAB — URINALYSIS
Glucose, UA: NEGATIVE mg/dL
Hgb urine dipstick: NEGATIVE
Leukocytes, UA: NEGATIVE
Nitrite: NEGATIVE
Protein, ur: NEGATIVE mg/dL
Urobilinogen, UA: 0.2 mg/dL (ref 0.0–1.0)

## 2012-12-17 LAB — GC/CHLAMYDIA PROBE AMP
CT Probe RNA: NEGATIVE
GC Probe RNA: NEGATIVE

## 2012-12-17 LAB — RUBELLA SCREEN: Rubella: 8.65 Index — ABNORMAL HIGH (ref <0.90)

## 2012-12-17 LAB — ANTIBODY SCREEN: Antibody Screen: NEGATIVE

## 2012-12-17 LAB — HIV ANTIBODY (ROUTINE TESTING W REFLEX): HIV: NONREACTIVE

## 2012-12-17 LAB — DRUG SCREEN, URINE, NO CONFIRMATION
Barbiturate Quant, Ur: NEGATIVE
Cocaine Metabolites: NEGATIVE
Creatinine,U: 131.9 mg/dL

## 2012-12-17 LAB — ABO AND RH: Rh Type: POSITIVE

## 2012-12-17 LAB — TSH: TSH: 0.775 u[IU]/mL (ref 0.350–4.500)

## 2012-12-18 LAB — CYSTIC FIBROSIS DIAGNOSTIC STUDY

## 2012-12-31 ENCOUNTER — Encounter: Payer: Medicaid Other | Admitting: Women's Health

## 2012-12-31 ENCOUNTER — Other Ambulatory Visit: Payer: Medicaid Other

## 2013-01-03 ENCOUNTER — Ambulatory Visit (INDEPENDENT_AMBULATORY_CARE_PROVIDER_SITE_OTHER): Payer: Medicaid Other

## 2013-01-03 ENCOUNTER — Ambulatory Visit (INDEPENDENT_AMBULATORY_CARE_PROVIDER_SITE_OTHER): Payer: Medicaid Other | Admitting: Women's Health

## 2013-01-03 ENCOUNTER — Encounter: Payer: Self-pay | Admitting: Women's Health

## 2013-01-03 ENCOUNTER — Other Ambulatory Visit: Payer: Self-pay | Admitting: Women's Health

## 2013-01-03 VITALS — BP 92/58 | Wt 113.0 lb

## 2013-01-03 DIAGNOSIS — Z3481 Encounter for supervision of other normal pregnancy, first trimester: Secondary | ICD-10-CM

## 2013-01-03 DIAGNOSIS — Z36 Encounter for antenatal screening of mother: Secondary | ICD-10-CM

## 2013-01-03 DIAGNOSIS — Z3490 Encounter for supervision of normal pregnancy, unspecified, unspecified trimester: Secondary | ICD-10-CM

## 2013-01-03 DIAGNOSIS — O2390 Unspecified genitourinary tract infection in pregnancy, unspecified trimester: Secondary | ICD-10-CM

## 2013-01-03 DIAGNOSIS — Z331 Pregnant state, incidental: Secondary | ICD-10-CM

## 2013-01-03 DIAGNOSIS — B9689 Other specified bacterial agents as the cause of diseases classified elsewhere: Secondary | ICD-10-CM

## 2013-01-03 DIAGNOSIS — Z1389 Encounter for screening for other disorder: Secondary | ICD-10-CM

## 2013-01-03 LAB — POCT URINALYSIS DIPSTICK: Blood, UA: NEGATIVE

## 2013-01-03 MED ORDER — METRONIDAZOLE 500 MG PO TABS: 500.0000 mg | ORAL_TABLET | Freq: Two times a day (BID) | ORAL | Status: DC

## 2013-01-03 NOTE — Patient Instructions (Signed)
Pregnancy - Second Trimester The second trimester of pregnancy (3 to 6 months) is a period of rapid growth for you and your baby. At the end of the sixth month, your baby is about 9 inches long and weighs 1 1/2 pounds. You will begin to feel the baby move between 18 and 20 weeks of the pregnancy. This is called quickening. Weight gain is faster. A clear fluid (colostrum) may leak out of your breasts. You may feel small contractions of the womb (uterus). This is known as false labor or Braxton-Hicks contractions. This is like a practice for labor when the baby is ready to be born. Usually, the problems with morning sickness have usually passed by the end of your first trimester. Some women develop small dark blotches (called cholasma, mask of pregnancy) on their face that usually goes away after the baby is born. Exposure to the sun makes the blotches worse. Acne may also develop in some pregnant women and pregnant women who have acne, may find that it goes away. PRENATAL EXAMS  Blood work may continue to be done during prenatal exams. These tests are done to check on your health and the probable health of your baby. Blood work is used to follow your blood levels (hemoglobin). Anemia (low hemoglobin) is common during pregnancy. Iron and vitamins are given to help prevent this. You will also be checked for diabetes between 24 and 28 weeks of the pregnancy. Some of the previous blood tests may be repeated.  The size of the uterus is measured during each visit. This is to make sure that the baby is continuing to grow properly according to the dates of the pregnancy.  Your blood pressure is checked every prenatal visit. This is to make sure you are not getting toxemia.  Your urine is checked to make sure you do not have an infection, diabetes or protein in the urine.  Your weight is checked often to make sure gains are happening at the suggested rate. This is to ensure that both you and your baby are  growing normally.  Sometimes, an ultrasound is performed to confirm the proper growth and development of the baby. This is a test which bounces harmless sound waves off the baby so your caregiver can more accurately determine due dates. Sometimes, a test is done on the amniotic fluid surrounding the baby. This test is called an amniocentesis. The amniotic fluid is obtained by sticking a needle into the belly (abdomen). This is done to check the chromosomes in instances where there is a concern about possible genetic problems with the baby. It is also sometimes done near the end of pregnancy if an early delivery is required. In this case, it is done to help make sure the baby's lungs are mature enough for the baby to live outside of the womb. CHANGES OCCURING IN THE SECOND TRIMESTER OF PREGNANCY Your body goes through many changes during pregnancy. They vary from person to person. Talk to your caregiver about changes you notice that you are concerned about.  During the second trimester, you will likely have an increase in your appetite. It is normal to have cravings for certain foods. This varies from person to person and pregnancy to pregnancy.  Your lower abdomen will begin to bulge.  You may have to urinate more often because the uterus and baby are pressing on your bladder. It is also common to get more bladder infections during pregnancy. You can help this by drinking lots of fluids   and emptying your bladder before and after intercourse.  You may begin to get stretch marks on your hips, abdomen, and breasts. These are normal changes in the body during pregnancy. There are no exercises or medicines to take that prevent this change.  You may begin to develop swollen and bulging veins (varicose veins) in your legs. Wearing support hose, elevating your feet for 15 minutes, 3 to 4 times a day and limiting salt in your diet helps lessen the problem.  Heartburn may develop as the uterus grows and  pushes up against the stomach. Antacids recommended by your caregiver helps with this problem. Also, eating smaller meals 4 to 5 times a day helps.  Constipation can be treated with a stool softener or adding bulk to your diet. Drinking lots of fluids, and eating vegetables, fruits, and whole grains are helpful.  Exercising is also helpful. If you have been very active up until your pregnancy, most of these activities can be continued during your pregnancy. If you have been less active, it is helpful to start an exercise program such as walking.  Hemorrhoids may develop at the end of the second trimester. Warm sitz baths and hemorrhoid cream recommended by your caregiver helps hemorrhoid problems.  Backaches may develop during this time of your pregnancy. Avoid heavy lifting, wear low heal shoes, and practice good posture to help with backache problems.  Some pregnant women develop tingling and numbness of their hand and fingers because of swelling and tightening of ligaments in the wrist (carpel tunnel syndrome). This goes away after the baby is born.  As your breasts enlarge, you may have to get a bigger bra. Get a comfortable, cotton, support bra. Do not get a nursing bra until the last month of the pregnancy if you will be nursing the baby.  You may get a dark line from your belly button to the pubic area called the linea nigra.  You may develop rosy cheeks because of increase blood flow to the face.  You may develop spider looking lines of the face, neck, arms, and chest. These go away after the baby is born. HOME CARE INSTRUCTIONS   It is extremely important to avoid all smoking, herbs, alcohol, and unprescribed drugs during your pregnancy. These chemicals affect the formation and growth of the baby. Avoid these chemicals throughout the pregnancy to ensure the delivery of a healthy infant.  Most of your home care instructions are the same as suggested for the first trimester of your  pregnancy. Keep your caregiver's appointments. Follow your caregiver's instructions regarding medicine use, exercise, and diet.  During pregnancy, you are providing food for you and your baby. Continue to eat regular, well-balanced meals. Choose foods such as meat, fish, milk and other low fat dairy products, vegetables, fruits, and whole-grain breads and cereals. Your caregiver will tell you of the ideal weight gain.  A physical sexual relationship may be continued up until near the end of pregnancy if there are no other problems. Problems could include early (premature) leaking of amniotic fluid from the membranes, vaginal bleeding, abdominal pain, or other medical or pregnancy problems.  Exercise regularly if there are no restrictions. Check with your caregiver if you are unsure of the safety of some of your exercises. The greatest weight gain will occur in the last 2 trimesters of pregnancy. Exercise will help you:  Control your weight.  Get you in shape for labor and delivery.  Lose weight after you have the baby.  Wear   a good support or jogging bra for breast tenderness during pregnancy. This may help if worn during sleep. Pads or tissues may be used in the bra if you are leaking colostrum.  Do not use hot tubs, steam rooms or saunas throughout the pregnancy.  Wear your seat belt at all times when driving. This protects you and your baby if you are in an accident.  Avoid raw meat, uncooked cheese, cat litter boxes, and soil used by cats. These carry germs that can cause birth defects in the baby.  The second trimester is also a good time to visit your dentist for your dental health if this has not been done yet. Getting your teeth cleaned is okay. Use a soft toothbrush. Brush gently during pregnancy.  It is easier to leak urine during pregnancy. Tightening up and strengthening the pelvic muscles will help with this problem. Practice stopping your urination while you are going to the  bathroom. These are the same muscles you need to strengthen. It is also the muscles you would use as if you were trying to stop from passing gas. You can practice tightening these muscles up 10 times a set and repeating this about 3 times per day. Once you know what muscles to tighten up, do not perform these exercises during urination. It is more likely to contribute to an infection by backing up the urine.  Ask for help if you have financial, counseling, or nutritional needs during pregnancy. Your caregiver will be able to offer counseling for these needs as well as refer you for other special needs.  Your skin may become oily. If so, wash your face with mild soap, use non-greasy moisturizer and oil or cream based makeup. MEDICINES AND DRUG USE IN PREGNANCY  Take prenatal vitamins as directed. The vitamin should contain 1 milligram of folic acid. Keep all vitamins out of reach of children. Only a couple vitamins or tablets containing iron may be fatal to a baby or young child when ingested.  Avoid use of all medicines, including herbs, over-the-counter medicines, not prescribed or suggested by your caregiver. Only take over-the-counter or prescription medicines for pain, discomfort, or fever as directed by your caregiver. Do not use aspirin.  Let your caregiver also know about herbs you may be using.  Alcohol is related to a number of birth defects. This includes fetal alcohol syndrome. All alcohol, in any form, should be avoided completely. Smoking will cause low birth rate and premature babies.  Street or illegal drugs are very harmful to the baby. They are absolutely forbidden. A baby born to an addicted mother will be addicted at birth. The baby will go through the same withdrawal an adult does. SEEK MEDICAL CARE IF:  You have any concerns or worries during your pregnancy. It is better to call with your questions if you feel they cannot wait, rather than worry about them. SEEK IMMEDIATE  MEDICAL CARE IF:   An unexplained oral temperature above 102 F (38.9 C) develops, or as your caregiver suggests.  You have leaking of fluid from the vagina (birth canal). If leaking membranes are suspected, take your temperature and tell your caregiver of this when you call.  There is vaginal spotting, bleeding, or passing clots. Tell your caregiver of the amount and how many pads are used. Light spotting in pregnancy is common, especially following intercourse.  You develop a bad smelling vaginal discharge with a change in the color from clear to white.  You continue to feel   sick to your stomach (nauseated) and have no relief from remedies suggested. You vomit blood or coffee ground-like materials.  You lose more than 2 pounds of weight or gain more than 2 pounds of weight over 1 week, or as suggested by your caregiver.  You notice swelling of your face, hands, feet, or legs.  You get exposed to [place] measles and have never had them.  You are exposed to fifth disease or chickenpox.  You develop belly (abdominal) pain. Round ligament discomfort is a common non-cancerous (benign) cause of abdominal pain in pregnancy. Your caregiver still must evaluate you.  You develop a bad headache that does not go away.  You develop fever, diarrhea, pain with urination, or shortness of breath.  You develop visual problems, blurry, or double vision.  You fall or are in a car accident or any kind of trauma.  There is mental or physical violence at home. Document Released: 04/11/2001 Document Revised: 01/10/2012 Document Reviewed: 10/14/2008 [employer] Patient Information 2014 [employer].  

## 2013-01-03 NOTE — Progress Notes (Signed)
Denies cramping, lof, vb, urinary frequency, urgency, hesitancy, or dysuria.  RLP, and still reports having BV and stated jennifer said we would treat her today. rx metronidazole. Reviewed warning s/s to report, u/s results. 1st NT/IT today.  All questions answered. F/U in 4wks for 2nd it and visit.

## 2013-01-03 NOTE — Progress Notes (Signed)
U/S(13+2wks)-active fetus, meas c/w dates, fluid wnl, NB present, NT=1.19mm, NB present, bilateral adnexa wnl, cx long and closed

## 2013-01-03 NOTE — Progress Notes (Signed)
Pain in left lower abd. 1st IT today.

## 2013-01-31 ENCOUNTER — Encounter: Payer: Medicaid Other | Admitting: Obstetrics & Gynecology

## 2013-02-03 ENCOUNTER — Encounter: Payer: Self-pay | Admitting: Obstetrics & Gynecology

## 2013-02-03 ENCOUNTER — Ambulatory Visit (INDEPENDENT_AMBULATORY_CARE_PROVIDER_SITE_OTHER): Payer: Medicaid Other | Admitting: Obstetrics & Gynecology

## 2013-02-03 ENCOUNTER — Other Ambulatory Visit: Payer: Self-pay | Admitting: Obstetrics & Gynecology

## 2013-02-03 VITALS — BP 80/60 | Wt 116.0 lb

## 2013-02-03 DIAGNOSIS — Z3480 Encounter for supervision of other normal pregnancy, unspecified trimester: Secondary | ICD-10-CM

## 2013-02-03 DIAGNOSIS — Z331 Pregnant state, incidental: Secondary | ICD-10-CM

## 2013-02-03 DIAGNOSIS — Z1389 Encounter for screening for other disorder: Secondary | ICD-10-CM

## 2013-02-03 DIAGNOSIS — Z3482 Encounter for supervision of other normal pregnancy, second trimester: Secondary | ICD-10-CM

## 2013-02-03 LAB — POCT URINALYSIS DIPSTICK
Blood, UA: NEGATIVE
Ketones, UA: NEGATIVE

## 2013-02-03 NOTE — Progress Notes (Signed)
BP weight and urine results all reviewed and noted. Patient reports good fetal movement, denies any bleeding and no rupture of membranes symptoms or regular contractions. Patient is without complaints. All questions were answered.  

## 2013-02-03 NOTE — Progress Notes (Signed)
FOR 2 IT TODAY.

## 2013-02-07 LAB — MATERNAL SCREEN, INTEGRATED #2
AFP, Serum: 38.5 ng/mL
Age risk Down Syndrome: 1:1100 {titer}
Calculated Gestational Age: 17.6
Estriol, Free: 1.29 ng/mL
Inhibin A MoM: 1.13
MSS Down Syndrome: <1:5000 {titer}
NT MoM: 1.01
Number of fetuses: 1
PAPP-A: 2350 ng/mL

## 2013-02-24 ENCOUNTER — Encounter: Payer: Self-pay | Admitting: Women's Health

## 2013-02-24 ENCOUNTER — Ambulatory Visit (INDEPENDENT_AMBULATORY_CARE_PROVIDER_SITE_OTHER): Payer: Medicaid Other

## 2013-02-24 ENCOUNTER — Ambulatory Visit (INDEPENDENT_AMBULATORY_CARE_PROVIDER_SITE_OTHER): Payer: Medicaid Other | Admitting: Women's Health

## 2013-02-24 VITALS — BP 102/60 | Wt 117.5 lb

## 2013-02-24 DIAGNOSIS — Z331 Pregnant state, incidental: Secondary | ICD-10-CM

## 2013-02-24 DIAGNOSIS — Z3482 Encounter for supervision of other normal pregnancy, second trimester: Secondary | ICD-10-CM

## 2013-02-24 DIAGNOSIS — Z1389 Encounter for screening for other disorder: Secondary | ICD-10-CM

## 2013-02-24 DIAGNOSIS — Z3480 Encounter for supervision of other normal pregnancy, unspecified trimester: Secondary | ICD-10-CM

## 2013-02-24 LAB — POCT URINALYSIS DIPSTICK
Ketones, UA: NEGATIVE
Leukocytes, UA: NEGATIVE
Protein, UA: NEGATIVE

## 2013-02-24 NOTE — Progress Notes (Signed)
Reports good fm. Denies uc's, lof, vb, urinary frequency, urgency, hesitancy, or dysuria.  Occ dizziness/lightheadedness, discussed likely causes of low bp of bs, and relief measures.  Reviewed today's u/s, ptl s/s, fm.  All questions answered. F/U in 4wks for visit.

## 2013-02-24 NOTE — Progress Notes (Signed)
U/S(20+5wks)-active fetus, meas c/w dates, fluid wnl, anterior Gr 0 plac, cx long and closed (3.6cm), bilateral adnexa wnl, no major abnl noted, female fetus

## 2013-03-24 ENCOUNTER — Encounter (INDEPENDENT_AMBULATORY_CARE_PROVIDER_SITE_OTHER): Payer: Self-pay

## 2013-03-24 ENCOUNTER — Ambulatory Visit (INDEPENDENT_AMBULATORY_CARE_PROVIDER_SITE_OTHER): Payer: Medicaid Other | Admitting: Obstetrics & Gynecology

## 2013-03-24 ENCOUNTER — Encounter: Payer: Self-pay | Admitting: Obstetrics & Gynecology

## 2013-03-24 VITALS — BP 100/60 | Wt 121.0 lb

## 2013-03-24 DIAGNOSIS — Z1389 Encounter for screening for other disorder: Secondary | ICD-10-CM

## 2013-03-24 DIAGNOSIS — Z3480 Encounter for supervision of other normal pregnancy, unspecified trimester: Secondary | ICD-10-CM

## 2013-03-24 DIAGNOSIS — Z331 Pregnant state, incidental: Secondary | ICD-10-CM

## 2013-03-24 LAB — POCT URINALYSIS DIPSTICK
Ketones, UA: NEGATIVE
Nitrite, UA: NEGATIVE

## 2013-03-24 MED ORDER — FLUCONAZOLE 150 MG PO TABS: 150.0000 mg | ORAL_TABLET | Freq: Once | ORAL | Status: DC

## 2013-03-24 NOTE — Progress Notes (Signed)
BP weight and urine results all reviewed and noted. Patient reports good fetal movement, denies any bleeding and no rupture of membranes symptoms or regular contractions. Patient is without complaints. All questions were answered.  

## 2013-04-21 ENCOUNTER — Other Ambulatory Visit: Payer: Medicaid Other

## 2013-04-21 ENCOUNTER — Encounter: Payer: Medicaid Other | Admitting: Adult Health

## 2013-04-28 ENCOUNTER — Other Ambulatory Visit: Payer: Medicaid Other

## 2013-04-28 ENCOUNTER — Encounter: Payer: Self-pay | Admitting: Adult Health

## 2013-04-28 ENCOUNTER — Ambulatory Visit (INDEPENDENT_AMBULATORY_CARE_PROVIDER_SITE_OTHER): Payer: Medicaid Other | Admitting: Adult Health

## 2013-04-28 VITALS — BP 110/66 | Wt 133.0 lb

## 2013-04-28 DIAGNOSIS — Z3490 Encounter for supervision of normal pregnancy, unspecified, unspecified trimester: Secondary | ICD-10-CM

## 2013-04-28 DIAGNOSIS — Z3483 Encounter for supervision of other normal pregnancy, third trimester: Secondary | ICD-10-CM

## 2013-04-28 DIAGNOSIS — O99019 Anemia complicating pregnancy, unspecified trimester: Secondary | ICD-10-CM

## 2013-04-28 DIAGNOSIS — Z3481 Encounter for supervision of other normal pregnancy, first trimester: Secondary | ICD-10-CM

## 2013-04-28 DIAGNOSIS — Z1389 Encounter for screening for other disorder: Secondary | ICD-10-CM

## 2013-04-28 DIAGNOSIS — Z331 Pregnant state, incidental: Secondary | ICD-10-CM

## 2013-04-28 DIAGNOSIS — O2390 Unspecified genitourinary tract infection in pregnancy, unspecified trimester: Secondary | ICD-10-CM

## 2013-04-28 DIAGNOSIS — N898 Other specified noninflammatory disorders of vagina: Secondary | ICD-10-CM

## 2013-04-28 LAB — POCT WET PREP (WET MOUNT): WBC, Wet Prep HPF POC: POSITIVE

## 2013-04-28 LAB — POCT URINALYSIS DIPSTICK
Blood, UA: NEGATIVE
Glucose, UA: NEGATIVE
Ketones, UA: NEGATIVE
Nitrite, UA: NEGATIVE

## 2013-04-28 LAB — CBC
HCT: 28.8 % — ABNORMAL LOW (ref 36.0–46.0)
Hemoglobin: 10.1 g/dL — ABNORMAL LOW (ref 12.0–15.0)
Platelets: 178 10*3/uL (ref 150–400)
RBC: 3.06 MIL/uL — ABNORMAL LOW (ref 3.87–5.11)
RDW: 13 % (ref 11.5–15.5)

## 2013-04-28 MED ORDER — PRENATAL 27-0.8 MG PO TABS: 1.0000 | ORAL_TABLET | Freq: Every day | ORAL | Status: DC

## 2013-04-28 NOTE — Progress Notes (Signed)
Has GFM FHR 147, has vaginal discharge,on exam has white discharge ,no odor, cervix closed,  on wet prep showed some WBCs no yeast or clue cells.NO cramping, here for PN2 today will follow up in 3 weeks or prn. Also says has milk coming from breast already.refilled prenatals.

## 2013-04-28 NOTE — Progress Notes (Signed)
Pt states that she is still having a discharge. Pt denies any other problems or concerns at this time.

## 2013-04-28 NOTE — Patient Instructions (Signed)
Third Trimester of Pregnancy The third trimester is from week 29 through week 42, months 7 through 9. The third trimester is a time when the fetus is growing rapidly. At the end of the ninth month, the fetus is about 20 inches in length and weighs 6 10 pounds.  BODY CHANGES Your body goes through many changes during pregnancy. The changes vary from woman to woman.   Your weight will continue to increase. You can expect to gain 25 35 pounds (11 16 kg) by the end of the pregnancy.  You may begin to get stretch marks on your hips, abdomen, and breasts.  You may urinate more often because the fetus is moving lower into your pelvis and pressing on your bladder.  You may develop or continue to have heartburn as a result of your pregnancy.  You may develop constipation because certain hormones are causing the muscles that push waste through your intestines to slow down.  You may develop hemorrhoids or swollen, bulging veins (varicose veins).  You may have pelvic pain because of the weight gain and pregnancy hormones relaxing your joints between the bones in your pelvis. Back aches may result from over exertion of the muscles supporting your posture.  Your breasts will continue to grow and be tender. A yellow discharge may leak from your breasts called colostrum.  Your belly button may stick out.  You may feel short of breath because of your expanding uterus.  You may notice the fetus "dropping," or moving lower in your abdomen.  You may have a bloody mucus discharge. This usually occurs a few days to a week before labor begins.  Your cervix becomes thin and soft (effaced) near your due date. WHAT TO EXPECT AT YOUR PRENATAL EXAMS  You will have prenatal exams every 2 weeks until week 36. Then, you will have weekly prenatal exams. During a routine prenatal visit:  You will be weighed to make sure you and the fetus are growing normally.  Your blood pressure is taken.  Your abdomen will be  measured to track your baby's growth.  The fetal heartbeat will be listened to.  Any test results from the previous visit will be discussed.  You may have a cervical check near your due date to see if you have effaced. At around 36 weeks, your caregiver will check your cervix. At the same time, your caregiver will also perform a test on the secretions of the vaginal tissue. This test is to determine if a type of bacteria, Group B streptococcus, is present. Your caregiver will explain this further. Your caregiver may ask you:  What your birth plan is.  How you are feeling.  If you are feeling the baby move.  If you have had any abnormal symptoms, such as leaking fluid, bleeding, severe headaches, or abdominal cramping.  If you have any questions. Other tests or screenings that may be performed during your third trimester include:  Blood tests that check for low iron levels (anemia).  Fetal testing to check the health, activity level, and growth of the fetus. Testing is done if you have certain medical conditions or if there are problems during the pregnancy. FALSE LABOR You may feel small, irregular contractions that eventually go away. These are called Braxton Hicks contractions, or false labor. Contractions may last for hours, days, or even weeks before true labor sets in. If contractions come at regular intervals, intensify, or become painful, it is best to be seen by your caregiver.  SIGNS OF LABOR   Menstrual-like cramps.  Contractions that are 5 minutes apart or less.  Contractions that start on the top of the uterus and spread down to the lower abdomen and back.  A sense of increased pelvic pressure or back pain.  A watery or bloody mucus discharge that comes from the vagina. If you have any of these signs before the 37th week of pregnancy, call your caregiver right away. You need to go to the hospital to get checked immediately. HOME CARE INSTRUCTIONS   Avoid all  smoking, herbs, alcohol, and unprescribed drugs. These chemicals affect the formation and growth of the baby.  Follow your caregiver's instructions regarding medicine use. There are medicines that are either safe or unsafe to take during pregnancy.  Exercise only as directed by your caregiver. Experiencing uterine cramps is a good sign to stop exercising.  Continue to eat regular, healthy meals.  Wear a good support bra for breast tenderness.  Do not use hot tubs, steam rooms, or saunas.  Wear your seat belt at all times when driving.  Avoid raw meat, uncooked cheese, cat litter boxes, and soil used by cats. These carry germs that can cause birth defects in the baby.  Take your prenatal vitamins.  Try taking a stool softener (if your caregiver approves) if you develop constipation. Eat more high-fiber foods, such as fresh vegetables or fruit and whole grains. Drink plenty of fluids to keep your urine clear or pale yellow.  Take warm sitz baths to soothe any pain or discomfort caused by hemorrhoids. Use hemorrhoid cream if your caregiver approves.  If you develop varicose veins, wear support hose. Elevate your feet for 15 minutes, 3 4 times a day. Limit salt in your diet.  Avoid heavy lifting, wear low heal shoes, and practice good posture.  Rest a lot with your legs elevated if you have leg cramps or low back pain.  Visit your dentist if you have not gone during your pregnancy. Use a soft toothbrush to brush your teeth and be gentle when you floss.  A sexual relationship may be continued unless your caregiver directs you otherwise.  Do not travel far distances unless it is absolutely necessary and only with the approval of your caregiver.  Take prenatal classes to understand, practice, and ask questions about the labor and delivery.  Make a trial run to the hospital.  Pack your hospital bag.  Prepare the baby's nursery.  Continue to go to all your prenatal visits as directed  by your caregiver. SEEK MEDICAL CARE IF:  You are unsure if you are in labor or if your water has broken.  You have dizziness.  You have mild pelvic cramps, pelvic pressure, or nagging pain in your abdominal area.  You have persistent nausea, vomiting, or diarrhea.  You have a bad smelling vaginal discharge.  You have pain with urination. SEEK IMMEDIATE MEDICAL CARE IF:   You have a fever.  You are leaking fluid from your vagina.  You have spotting or bleeding from your vagina.  You have severe abdominal cramping or pain.  You have rapid weight loss or gain.  You have shortness of breath with chest pain.  You notice sudden or extreme swelling of your face, hands, ankles, feet, or legs.  You have not felt your baby move in over an hour.  You have severe headaches that do not go away with medicine.  You have vision changes. Document Released: 04/11/2001 Document Revised: 12/18/2012 Document Reviewed:   06/18/2012 ExitCare Patient Information 2014 [place], [place]. Follow up in 3 weeks

## 2013-04-29 LAB — GLUCOSE TOLERANCE, 2 HOURS W/ 1HR: Glucose, Fasting: 76 mg/dL (ref 70–99)

## 2013-04-29 LAB — HIV ANTIBODY (ROUTINE TESTING W REFLEX): HIV: NONREACTIVE

## 2013-04-29 LAB — RPR

## 2013-05-01 NOTE — L&D Delivery Note (Signed)
Delivery Note At 7:28 AM a viable female was delivered via Vaginal, Spontaneous Delivery (Presentation: Middle Occiput Anterior).  APGAR: 9, 9; weight .   Placenta status: Intact, Spontaneous.  Cord: 3 vessels with the following complications: None.  Cord pH: Not sent Anesthesia: None  Episiotomy: None Lacerations: None Suture Repair: N/A Est. Blood Loss (mL): 500  Mom to postpartum.  Baby to Couplet care / Skin to Skin.  [name] presented with SOL and progressed to complete. She was actively pushing for almost 1 hour when Dr. Ike Benedom was able to reduce anterior cervical lip. Shortly after head presented. Loose nuchal cord reduced with SVD of crying female. Father cut cord. EBL at 500 mL, vigorous fundal massage with pitocin started after delivery and 800 mcg cytotec PR. Bleeding had ceased, uterus was firm and midline. No lacerations. Dr. Ike Benedom reduced cervical lip, massaged fundus, and supervised delivery. Counts correct x2. Continue routine post-partum care.  [name], [name] 07/16/2013, 7:57 AM   I was present for the entire delivery and agree with resident's note and plan of care.  Tawana ScaleMichael Ryan Markee Remlinger, MD OB Fellow 07/16/2013 1:24 PM

## 2013-05-19 ENCOUNTER — Encounter: Payer: Self-pay | Admitting: Obstetrics and Gynecology

## 2013-05-19 ENCOUNTER — Ambulatory Visit (INDEPENDENT_AMBULATORY_CARE_PROVIDER_SITE_OTHER): Payer: Medicaid Other | Admitting: Obstetrics and Gynecology

## 2013-05-19 VITALS — BP 100/52 | Wt 129.6 lb

## 2013-05-19 DIAGNOSIS — Z3480 Encounter for supervision of other normal pregnancy, unspecified trimester: Secondary | ICD-10-CM

## 2013-05-19 DIAGNOSIS — Z1389 Encounter for screening for other disorder: Secondary | ICD-10-CM

## 2013-05-19 DIAGNOSIS — Z331 Pregnant state, incidental: Secondary | ICD-10-CM

## 2013-05-19 DIAGNOSIS — O99019 Anemia complicating pregnancy, unspecified trimester: Secondary | ICD-10-CM

## 2013-05-19 LAB — POCT URINALYSIS DIPSTICK
GLUCOSE UA: NEGATIVE
Ketones, UA: NEGATIVE
NITRITE UA: NEGATIVE
PROTEIN UA: NEGATIVE

## 2013-05-19 MED ORDER — FUSION PLUS PO CAPS: 1.0000 | ORAL_CAPSULE | Freq: Every day | ORAL | Status: DC

## 2013-05-19 NOTE — Progress Notes (Signed)
Labs reviewed Hgb Hct 38->28, pt not on PNV or iron, will start. Gtt normal, 70's, Good fm, no concerns.

## 2013-05-19 NOTE — Patient Instructions (Addendum)
Fetal Movement Counts Patient Name: __________________________________________________ Patient Due Date: ____________________ Performing a fetal movement count is highly recommended in high-risk pregnancies, but it is good for every pregnant woman to do. Your caregiver may ask you to start counting fetal movements at 28 weeks of the pregnancy. Fetal movements often increase:  After eating a full meal.  After physical activity.  After eating or drinking something sweet or cold.  At rest. Pay attention to when you feel the baby is most active. This will help you notice a pattern of your baby's sleep and wake cycles and what factors contribute to an increase in fetal movement. It is important to perform a fetal movement count at the same time each day when your baby is normally most active.  HOW TO COUNT FETAL MOVEMENTS 1. Find a quiet and comfortable area to sit or lie down on your left side. Lying on your left side provides the best blood and oxygen circulation to your baby. 2. Write down the day and time on a sheet of paper or in a journal. 3. Start counting kicks, flutters, swishes, rolls, or jabs in a 2 hour period. You should feel at least 10 movements within 2 hours. 4. If you do not feel 10 movements in 2 hours, wait 2 3 hours and count again. Look for a change in the pattern or not enough counts in 2 hours. SEEK MEDICAL CARE IF:  You feel less than 10 counts in 2 hours, tried twice.  There is no movement in over an hour.  The pattern is changing or taking longer each day to reach 10 counts in 2 hours.  You feel the baby is not moving as he or she usually does. Date: ____________ Movements: ____________ Start time: ____________ Finish time: ____________  Date: ____________ Movements: ____________ Start time: ____________ Finish time: ____________ Date: ____________ Movements: ____________ Start time: ____________ Finish time: ____________ Date: ____________ Movements: ____________  Start time: ____________ Finish time: ____________ Date: ____________ Movements: ____________ Start time: ____________ Finish time: ____________ Date: ____________ Movements: ____________ Start time: ____________ Finish time: ____________ Date: ____________ Movements: ____________ Start time: ____________ Finish time: ____________ Date: ____________ Movements: ____________ Start time: ____________ Finish time: ____________  Date: ____________ Movements: ____________ Start time: ____________ Finish time: ____________ Date: ____________ Movements: ____________ Start time: ____________ Finish time: ____________ Date: ____________ Movements: ____________ Start time: ____________ Finish time: ____________ Date: ____________ Movements: ____________ Start time: ____________ Finish time: ____________ Date: ____________ Movements: ____________ Start time: ____________ Finish time: ____________ Date: ____________ Movements: ____________ Start time: ____________ Finish time: ____________ Date: ____________ Movements: ____________ Start time: ____________ Finish time: ____________  Date: ____________ Movements: ____________ Start time: ____________ Finish time: ____________ Date: ____________ Movements: ____________ Start time: ____________ Finish time: ____________ Date: ____________ Movements: ____________ Start time: ____________ Finish time: ____________ Date: ____________ Movements: ____________ Start time: ____________ Finish time: ____________ Date: ____________ Movements: ____________ Start time: ____________ Finish time: ____________ Date: ____________ Movements: ____________ Start time: ____________ Finish time: ____________ Date: ____________ Movements: ____________ Start time: ____________ Finish time: ____________  Date: ____________ Movements: ____________ Start time: ____________ Finish time: ____________ Date: ____________ Movements: ____________ Start time: ____________ Finish time:  ____________ Date: ____________ Movements: ____________ Start time: ____________ Finish time: ____________ Date: ____________ Movements: ____________ Start time: ____________ Finish time: ____________ Date: ____________ Movements: ____________ Start time: ____________ Finish time: ____________ Date: ____________ Movements: ____________ Start time: ____________ Finish time: ____________ Date: ____________ Movements: ____________ Start time: ____________ Finish time: ____________  Date: ____________ Movements: ____________ Start time: ____________ Finish   time: ____________ Date: ____________ Movements: ____________ Start time: ____________ Doreatha MartinFinish time: ____________ Date: ____________ Movements: ____________ Start time: ____________ Doreatha MartinFinish time: ____________ Date: ____________ Movements: ____________ Start time: ____________ Doreatha MartinFinish time: ____________ Date: ____________ Movements: ____________ Start time: ____________ Doreatha MartinFinish time: ____________ Date: ____________ Movements: ____________ Start time: ____________ Doreatha MartinFinish time: ____________ Date: ____________ Movements: ____________ Start time: ____________ Doreatha MartinFinish time: ____________  Date: ____________ Movements: ____________ Start time: ____________ Doreatha MartinFinish time: ____________ Date: ____________ Movements: ____________ Start time: ____________ Doreatha MartinFinish time: ____________ Date: ____________ Movements: ____________ Start time: ____________ Doreatha MartinFinish time: ____________ Date: ____________ Movements: ____________ Start time: ____________ Doreatha MartinFinish time: ____________ Date: ____________ Movements: ____________ Start time: ____________ Doreatha MartinFinish time: ____________ Date: ____________ Movements: ____________ Start time: ____________ Doreatha MartinFinish time: ____________ Date: ____________ Movements: ____________ Start time: ____________ Doreatha MartinFinish time: ____________  Date: ____________ Movements: ____________ Start time: ____________ Doreatha MartinFinish time: ____________ Date: ____________ Movements:  ____________ Start time: ____________ Doreatha MartinFinish time: ____________ Date: ____________ Movements: ____________ Start time: ____________ Doreatha MartinFinish time: ____________ Date: ____________ Movements: ____________ Start time: ____________ Doreatha MartinFinish time: ____________ Date: ____________ Movements: ____________ Start time: ____________ Doreatha MartinFinish time: ____________ Date: ____________ Movements: ____________ Start time: ____________ Doreatha MartinFinish time: ____________ Date: ____________ Movements: ____________ Start time: ____________ Doreatha MartinFinish time: ____________  Date: ____________ Movements: ____________ Start time: ____________ Doreatha MartinFinish time: ____________ Date: ____________ Movements: ____________ Start time: ____________ Doreatha MartinFinish time: ____________ Date: ____________ Movements: ____________ Start time: ____________ Doreatha MartinFinish time: ____________ Date: ____________ Movements: ____________ Start time: ____________ Doreatha MartinFinish time: ____________ Date: ____________ Movements: ____________ Start time: ____________ Doreatha MartinFinish time: ____________ Date: ____________ Movements: ____________ Start time: ____________ Doreatha MartinFinish time: ____________ Document Released: 05/17/2006 Document Revised: 04/03/2012 Document Reviewed: 02/12/2012 [employer] Patient Information 2014 [employer].  Pregnancy ++-most popular app currently Free up until 13 weeks. $2.99 for entire pregnancy. Has informative videos, pictures to track fetal development, pregnancy calender, pregnancy journal [facility] Pregnancy- Free app that allows you to ask questions, talk with other pregnant women, has informative videos and pictures of growing babies, due date calculator, pregnancy tracker [facility] Baby- Free app for after the birth, gives you a place to discuss products, take pictures of baby, allows for interactions with other moms and professionals.

## 2013-06-02 ENCOUNTER — Encounter: Payer: Self-pay | Admitting: Women's Health

## 2013-06-02 ENCOUNTER — Ambulatory Visit (INDEPENDENT_AMBULATORY_CARE_PROVIDER_SITE_OTHER): Payer: Medicaid Other | Admitting: Women's Health

## 2013-06-02 VITALS — BP 120/58 | Wt 135.0 lb

## 2013-06-02 DIAGNOSIS — O2390 Unspecified genitourinary tract infection in pregnancy, unspecified trimester: Secondary | ICD-10-CM

## 2013-06-02 DIAGNOSIS — Z3480 Encounter for supervision of other normal pregnancy, unspecified trimester: Secondary | ICD-10-CM

## 2013-06-02 DIAGNOSIS — O99019 Anemia complicating pregnancy, unspecified trimester: Secondary | ICD-10-CM

## 2013-06-02 DIAGNOSIS — Z331 Pregnant state, incidental: Secondary | ICD-10-CM

## 2013-06-02 DIAGNOSIS — O2340 Unspecified infection of urinary tract in pregnancy, unspecified trimester: Secondary | ICD-10-CM

## 2013-06-02 DIAGNOSIS — Z1389 Encounter for screening for other disorder: Secondary | ICD-10-CM

## 2013-06-02 DIAGNOSIS — R82998 Other abnormal findings in urine: Secondary | ICD-10-CM

## 2013-06-02 LAB — POCT URINALYSIS DIPSTICK
Glucose, UA: NEGATIVE
KETONES UA: NEGATIVE
Nitrite, UA: NEGATIVE

## 2013-06-02 MED ORDER — NITROFURANTOIN MONOHYD MACRO 100 MG PO CAPS: 100.0000 mg | ORAL_CAPSULE | Freq: Two times a day (BID) | ORAL | Status: DC

## 2013-06-02 NOTE — Patient Instructions (Signed)
Preterm Labor Information °Preterm labor is when labor starts at less than 37 weeks of pregnancy. The normal length of a pregnancy is 39 to 41 weeks. °CAUSES °Often, there is no identifiable underlying cause as to why a woman goes into preterm labor. One of the most common known causes of preterm labor is infection. Infections of the uterus, cervix, vagina, amniotic sac, bladder, kidney, or even the lungs (pneumonia) can cause labor to start. Other suspected causes of preterm labor include:  °· Urogenital infections, such as yeast infections and bacterial vaginosis.   °· Uterine abnormalities (uterine shape, uterine septum, fibroids, or bleeding from the placenta).   °· A cervix that has been operated on (it may fail to stay closed).   °· Malformations in the fetus.   °· Multiple gestations (twins, triplets, and so on).   °· Breakage of the amniotic sac.   °RISK FACTORS °· Having a previous history of preterm labor.   °· Having premature rupture of membranes (PROM).   °· Having a placenta that covers the opening of the cervix (placenta previa).   °· Having a placenta that separates from the uterus (placental abruption).   °· Having a cervix that is too weak to hold the fetus in the uterus (incompetent cervix).   °· Having too much fluid in the amniotic sac (polyhydramnios).   °· Taking illegal drugs or smoking while pregnant.   °· Not gaining enough weight while pregnant.   °· Being younger than 18 and older than 22 years old.   °· Having a low socioeconomic status.   °· Being African American. °SYMPTOMS °Signs and symptoms of preterm labor include:  °· Menstrual-like cramps, abdominal pain, or back pain. °· Uterine contractions that are regular, as frequent as six in an hour, regardless of their intensity (may be mild or painful). °· Contractions that start on the top of the uterus and spread down to the lower abdomen and back.   °· A sense of increased pelvic pressure.   °· A watery or bloody mucus discharge that  comes from the vagina.   °TREATMENT °Depending on the length of the pregnancy and other circumstances, your health care provider may suggest bed rest. If necessary, there are medicines that can be given to stop contractions and to mature the fetal lungs. If labor happens before 34 weeks of pregnancy, a prolonged hospital stay may be recommended. Treatment depends on the condition of both you and the fetus.  °WHAT SHOULD YOU DO IF YOU THINK YOU ARE IN PRETERM LABOR? °Call your health care provider right away. You will need to go to the hospital to get checked immediately. °HOW CAN YOU PREVENT PRETERM LABOR IN FUTURE PREGNANCIES? °You should:  °· Stop smoking if you smoke.  °· Maintain healthy weight gain and avoid chemicals and drugs that are not necessary. °· Be watchful for any type of infection. °· Inform your health care provider if you have a known history of preterm labor. °Document Released: 07/08/2003 Document Revised: 12/18/2012 Document Reviewed: 05/20/2012 °ExitCare® Patient Information ©2014 [employer]. ° °Pregnancy and Urinary Tract Infection °A urinary tract infection (UTI) is a bacterial infection of the urinary tract. Infection of the urinary tract can include the ureters, kidneys (pyelonephritis), bladder (cystitis), and urethra (urethritis). All pregnant women should be screened for bacteria in the urinary tract. Identifying and treating a UTI will decrease the risk of preterm labor and developing more serious infections in both the mother and baby. °CAUSES °Bacteria germs cause almost all UTIs.  °RISK FACTORS °Many factors can increase your chances of getting a UTI during pregnancy. These include: °·   include:  Having a short urethra.  Poor toilet and hygiene habits.  Sexual intercourse.  Blockage of urine along the urinary tract.  Problems with the pelvic muscles or nerves.  Diabetes.  Obesity.  Bladder problems after having several children.  Previous history of UTI. SIGNS AND SYMPTOMS    Pain, burning, or a stinging feeling when urinating.  Suddenly feeling the need to urinate right away (urgency).  Loss of bladder control (urinary incontinence).  Frequent urination, more than is common with pregnancy.  Lower abdominal or back discomfort.  Cloudy urine.  Blood in the urine (hematuria).  Fever. When the kidneys are infected, the symptoms may be:  Back pain.  Flank pain on the right side more so than the left.  Fever.  Chills.  Nausea.  Vomiting. DIAGNOSIS  A urinary tract infection is usually diagnosed through urine tests. Additional tests and procedures are sometimes done. These may include:  Ultrasound exam of the kidneys, ureters, bladder, and urethra.  Looking in the bladder with a lighted tube (cystoscopy). TREATMENT Typically, UTIs can be treated with antibiotic medicines.  HOME CARE INSTRUCTIONS   Only take over-the-counter or prescription medicines as directed by your health care provider. If you were prescribed antibiotics, take them as directed. Finish them even if you start to feel better.  Drink enough fluids to keep your urine clear or pale yellow.  Do not have sexual intercourse until the infection is gone and your health care provider says it is okay.  Make sure you are tested for UTIs throughout your pregnancy. These infections often come back. Preventing a UTI in the Future  Practice good toilet habits. Always wipe from front to back. Use the tissue only once.  Do not hold your urine. Empty your bladder as soon as possible when the urge comes.  Do not douche or use deodorant sprays.  Wash with soap and warm water around the genital area and the anus.  Empty your bladder before and after sexual intercourse.  Wear underwear with a cotton crotch.  Avoid caffeine and carbonated drinks. They can irritate the bladder.  Drink cranberry juice or take cranberry pills. This may decrease the risk of getting a UTI.  Do not  drink alcohol.  Keep all your appointments and tests as scheduled. SEEK MEDICAL CARE IF:   Your symptoms get worse.  You are still having fevers 2 or more days after treatment begins.  You have a rash.  You feel that you are having problems with medicines prescribed.  You have abnormal vaginal discharge. SEEK IMMEDIATE MEDICAL CARE IF:   You have back or flank pain.  You have chills.  You have blood in your urine.  You have nausea and vomiting.  You have contractions of your uterus.  You have a gush of fluid from the vagina. MAKE SURE YOU:  Understand these instructions.   Will watch your condition.   Will get help right away if you are not doing well or get worse.  Document Released: 08/12/2010 Document Revised: 02/05/2013 Document Reviewed: 11/14/2012 [facility] Patient Information 2014 [place].

## 2013-06-02 NOTE — Progress Notes (Signed)
Reports good fm. Denies uc's, lof, vb. Urinary frequency x 1 month, dysuria. Strong odor to urine x .  Will treat for uti and send cx. Rx Macrobid.  Reviewed ptl s/s, fkc.  All questions answered. F/U in 2wks for visit.

## 2013-06-04 LAB — URINE CULTURE
Colony Count: NO GROWTH
Organism ID, Bacteria: NO GROWTH

## 2013-06-16 ENCOUNTER — Encounter: Payer: Self-pay | Admitting: Women's Health

## 2013-06-16 ENCOUNTER — Ambulatory Visit (INDEPENDENT_AMBULATORY_CARE_PROVIDER_SITE_OTHER): Payer: Medicaid Other | Admitting: Women's Health

## 2013-06-16 VITALS — BP 100/52 | Wt 139.0 lb

## 2013-06-16 DIAGNOSIS — O99019 Anemia complicating pregnancy, unspecified trimester: Secondary | ICD-10-CM

## 2013-06-16 DIAGNOSIS — Z3480 Encounter for supervision of other normal pregnancy, unspecified trimester: Secondary | ICD-10-CM

## 2013-06-16 DIAGNOSIS — Z331 Pregnant state, incidental: Secondary | ICD-10-CM

## 2013-06-16 DIAGNOSIS — Z1389 Encounter for screening for other disorder: Secondary | ICD-10-CM

## 2013-06-16 LAB — OB RESULTS CONSOLE GC/CHLAMYDIA
CHLAMYDIA, DNA PROBE: NEGATIVE
GC PROBE AMP, GENITAL: NEGATIVE

## 2013-06-16 LAB — POCT URINALYSIS DIPSTICK
Glucose, UA: NEGATIVE
Ketones, UA: NEGATIVE
Leukocytes, UA: NEGATIVE
Nitrite, UA: NEGATIVE
Protein, UA: NEGATIVE

## 2013-06-16 NOTE — Progress Notes (Signed)
Reports good fm. Denies regular/painful uc's, lof, vb, uti s/s.  No complaints.  Requests SVE. Reviewed ptl s/s, fkc.  All questions answered. F/U in 1wk for visit. GBS today.

## 2013-06-16 NOTE — Patient Instructions (Signed)
Call the office or go to [facility] if:  You begin to have strong, frequent contractions  Your water breaks.  Sometimes it is a big gush of fluid, sometimes it is just a trickle that keeps getting your panties wet or running down your legs  You have vaginal bleeding.  It is normal to have a small amount of spotting if your cervix was checked.   You don't feel your baby moving like normal.  If you don't, get you something to eat and drink and lay down and focus on feeling your baby move.  You should feel at least 10 movements in 2 hours.  If you don't, you should call the office or go to [facility].   

## 2013-06-17 LAB — GC/CHLAMYDIA PROBE AMP
CT PROBE, AMP APTIMA: NEGATIVE
GC PROBE AMP APTIMA: NEGATIVE

## 2013-06-18 ENCOUNTER — Encounter: Payer: Self-pay | Admitting: Women's Health

## 2013-06-18 LAB — STREP B DNA PROBE: GBSP: NEGATIVE

## 2013-06-24 ENCOUNTER — Encounter: Payer: Medicaid Other | Admitting: Advanced Practice Midwife

## 2013-06-25 ENCOUNTER — Encounter: Payer: Self-pay | Admitting: Women's Health

## 2013-06-25 ENCOUNTER — Ambulatory Visit (INDEPENDENT_AMBULATORY_CARE_PROVIDER_SITE_OTHER): Payer: Medicaid Other | Admitting: Women's Health

## 2013-06-25 VITALS — BP 120/62 | Wt 140.5 lb

## 2013-06-25 DIAGNOSIS — O26849 Uterine size-date discrepancy, unspecified trimester: Secondary | ICD-10-CM

## 2013-06-25 DIAGNOSIS — Z1389 Encounter for screening for other disorder: Secondary | ICD-10-CM

## 2013-06-25 DIAGNOSIS — O99019 Anemia complicating pregnancy, unspecified trimester: Secondary | ICD-10-CM

## 2013-06-25 DIAGNOSIS — Z3480 Encounter for supervision of other normal pregnancy, unspecified trimester: Secondary | ICD-10-CM

## 2013-06-25 DIAGNOSIS — Z331 Pregnant state, incidental: Secondary | ICD-10-CM

## 2013-06-25 LAB — POCT URINALYSIS DIPSTICK
GLUCOSE UA: NEGATIVE
Ketones, UA: NEGATIVE
NITRITE UA: NEGATIVE
Protein, UA: NEGATIVE

## 2013-06-25 NOTE — Patient Instructions (Signed)
Call the office or go to [facility] if:  You begin to have strong, frequent contractions  Your water breaks.  Sometimes it is a big gush of fluid, sometimes it is just a trickle that keeps getting your panties wet or running down your legs  You have vaginal bleeding.  It is normal to have a small amount of spotting if your cervix was checked.   You don't feel your baby moving like normal.  If you don't, get you something to eat and drink and lay down and focus on feeling your baby move.  You should feel at least 10 movements in 2 hours.  If you don't, you should call the office or go to [facility].   Braxton Hicks Contractions Pregnancy is commonly associated with contractions of the uterus throughout the pregnancy. Towards the end of pregnancy (32 to 34 weeks), these contractions (Braxton Hicks) can develop more often and may become more forceful. This is not true labor because these contractions do not result in opening (dilatation) and thinning of the cervix. They are sometimes difficult to tell apart from true labor because these contractions can be forceful and people have different pain tolerances. You should not feel embarrassed if you go to the hospital with false labor. Sometimes, the only way to tell if you are in true labor is for your caregiver to follow the changes in the cervix. How to tell the difference between true and false labor:  False labor.  The contractions of false labor are usually shorter, irregular and not as hard as those of true labor.  They are often felt in the front of the lower abdomen and in the groin.  They may leave with walking around or changing positions while lying down.  They get weaker and are shorter lasting as time goes on.  These contractions are usually irregular.  They do not usually become progressively stronger, regular and closer together as with true labor.  True labor.  Contractions in true labor last 30 to 70 seconds,  become very regular, usually become more intense, and increase in frequency.  They do not go away with walking.  The discomfort is usually felt in the top of the uterus and spreads to the lower abdomen and low back.  True labor can be determined by your caregiver with an exam. This will show that the cervix is dilating and getting thinner. If there are no prenatal problems or other health problems associated with the pregnancy, it is completely safe to be sent home with false labor and await the onset of true labor. HOME CARE INSTRUCTIONS   Keep up with your usual exercises and instructions.  Take medications as directed.  Keep your regular prenatal appointment.  Eat and drink lightly if you think you are going into labor.  If BH contractions are making you uncomfortable:  Change your activity position from lying down or resting to walking/walking to resting.  Sit and rest in a tub of warm water.  Drink 2 to 3 glasses of water. Dehydration may cause B-H contractions.  Do slow and deep breathing several times an hour. SEEK IMMEDIATE MEDICAL CARE IF:   Your contractions continue to become stronger, more regular, and closer together.  You have a gushing, burst or leaking of fluid from the vagina.  An oral temperature above 102 F (38.9 C) develops.  You have passage of blood-tinged mucus.  You develop vaginal bleeding.  You develop continuous belly (abdominal) pain.  You have   low back pain that you never had before.  You feel the baby's head pushing down causing pelvic pressure.  The baby is not moving as much as it used to. Document Released: 04/17/2005 Document Revised: 07/10/2011 Document Reviewed: 01/27/2013 [employer] Patient Information 2014 [employer].  

## 2013-06-25 NOTE — Progress Notes (Signed)
FH 33cm. 1st baby weighed 8lbs. Not sure if baby has dropped. Reports good fm. Denies uc's, lof, vb, uti s/s.  No complaints.  Requests SVE. Reviewed labor s/s, fkc, gbs neg.  All questions answered. F/U asap for growth/afi u/s for s<d, 1 wk for visit.

## 2013-06-27 ENCOUNTER — Encounter: Payer: Self-pay | Admitting: Obstetrics and Gynecology

## 2013-06-27 NOTE — Progress Notes (Unsigned)
Patient ID: Jennifer [name], female   DOB: 31-Jan-1991, 23 y.o.   MRN: [id number] PT CALLED STATING SHE IS [redacted] WEEKS PREGNANT, HAVING SYMPTOMS OF FLU, VOMITING, DIARRHEA, FEVER, DECREASED FETAL MOVEMENT.  SPOKE WITH RN CHRYSTAL IN OUR OFFICE AND SHE ADVISED FOR ME TO TELL PT TO GO TO [facility] TO BE CHECKED TO MAYBE GET FLUIDS AND CHECK DECREASED FETAL MOVEMENT.  PATIENT WAS ADVISED AND IS GOING TO [facility]  Jennifer [name]

## 2013-07-02 ENCOUNTER — Ambulatory Visit (INDEPENDENT_AMBULATORY_CARE_PROVIDER_SITE_OTHER): Payer: Medicaid Other

## 2013-07-02 ENCOUNTER — Encounter: Payer: Self-pay | Admitting: Obstetrics & Gynecology

## 2013-07-02 ENCOUNTER — Ambulatory Visit (INDEPENDENT_AMBULATORY_CARE_PROVIDER_SITE_OTHER): Payer: Medicaid Other | Admitting: Obstetrics & Gynecology

## 2013-07-02 VITALS — BP 120/70 | Wt 138.0 lb

## 2013-07-02 DIAGNOSIS — O99019 Anemia complicating pregnancy, unspecified trimester: Secondary | ICD-10-CM

## 2013-07-02 DIAGNOSIS — O26849 Uterine size-date discrepancy, unspecified trimester: Secondary | ICD-10-CM

## 2013-07-02 DIAGNOSIS — Z331 Pregnant state, incidental: Secondary | ICD-10-CM

## 2013-07-02 DIAGNOSIS — Z1389 Encounter for screening for other disorder: Secondary | ICD-10-CM

## 2013-07-02 DIAGNOSIS — Z3480 Encounter for supervision of other normal pregnancy, unspecified trimester: Secondary | ICD-10-CM

## 2013-07-02 LAB — POCT URINALYSIS DIPSTICK
Blood, UA: NEGATIVE
GLUCOSE UA: NEGATIVE
Ketones, UA: NEGATIVE
LEUKOCYTES UA: NEGATIVE
Nitrite, UA: NEGATIVE

## 2013-07-02 NOTE — Addendum Note (Signed)
Addended by: Colen DarlingYOUNG, JANET S on: 07/02/2013 02:56 PM   Modules accepted: Orders

## 2013-07-02 NOTE — Progress Notes (Signed)
sono good growth  BP weight and urine results all reviewed and noted. Patient reports good fetal movement, denies any bleeding and no rupture of membranes symptoms or regular contractions. Patient is without complaints. All questions were answered.

## 2013-07-02 NOTE — Progress Notes (Signed)
U/S(39+0wks)-vtx active fetus, EFW 7 lb 12 oz (62nd%tile), fluid wnl SDP-4.8cm, FHR-148 bpm, female fetus, anterior Gr 2 placenta

## 2013-07-05 ENCOUNTER — Inpatient Hospital Stay (HOSPITAL_COMMUNITY)
Admission: AD | Admit: 2013-07-05 | Discharge: 2013-07-06 | Disposition: A | Discharge status: Home / Self Care | Payer: Medicaid Other | Attending: Obstetrics & Gynecology | Admitting: Obstetrics & Gynecology

## 2013-07-05 ENCOUNTER — Encounter (HOSPITAL_COMMUNITY): Payer: Self-pay | Admitting: Emergency Medicine

## 2013-07-05 DIAGNOSIS — O479 False labor, unspecified: Secondary | ICD-10-CM

## 2013-07-05 DIAGNOSIS — Z3490 Encounter for supervision of normal pregnancy, unspecified, unspecified trimester: Secondary | ICD-10-CM

## 2013-07-05 DIAGNOSIS — Z87891 Personal history of nicotine dependence: Secondary | ICD-10-CM | POA: Insufficient documentation

## 2013-07-05 LAB — CBC WITH DIFFERENTIAL/PLATELET
BASOS ABS: 0 10*3/uL (ref 0.0–0.1)
BASOS PCT: 0 % (ref 0–1)
EOS PCT: 1 % (ref 0–5)
Eosinophils Absolute: 0.1 10*3/uL (ref 0.0–0.7)
HEMATOCRIT: 27.4 % — AB (ref 36.0–46.0)
Hemoglobin: 9.5 g/dL — ABNORMAL LOW (ref 12.0–15.0)
Lymphocytes Relative: 21 % (ref 12–46)
Lymphs Abs: 1.9 10*3/uL (ref 0.7–4.0)
MCH: 32.6 pg (ref 26.0–34.0)
MCHC: 34.7 g/dL (ref 30.0–36.0)
MCV: 94.2 fL (ref 78.0–100.0)
MONO ABS: 0.9 10*3/uL (ref 0.1–1.0)
Monocytes Relative: 10 % (ref 3–12)
Neutro Abs: 6.3 10*3/uL (ref 1.7–7.7)
Neutrophils Relative %: 69 % (ref 43–77)
Platelets: 182 10*3/uL (ref 150–400)
RBC: 2.91 MIL/uL — ABNORMAL LOW (ref 3.87–5.11)
RDW: 13.3 % (ref 11.5–15.5)
WBC: 9.2 10*3/uL (ref 4.0–10.5)

## 2013-07-05 LAB — COMPREHENSIVE METABOLIC PANEL
ALBUMIN: 2.7 g/dL — AB (ref 3.5–5.2)
ALT: 16 U/L (ref 0–35)
AST: 30 U/L (ref 0–37)
Alkaline Phosphatase: 385 U/L — ABNORMAL HIGH (ref 39–117)
BUN: 9 mg/dL (ref 6–23)
CALCIUM: 8.7 mg/dL (ref 8.4–10.5)
CO2: 25 mEq/L (ref 19–32)
CREATININE: 0.7 mg/dL (ref 0.50–1.10)
Chloride: 102 mEq/L (ref 96–112)
GFR calc Af Amer: >90 mL/min (ref >90)
Glucose, Bld: 81 mg/dL (ref 70–99)
Potassium: 3.6 mEq/L — ABNORMAL LOW (ref 3.7–5.3)
Sodium: 138 mEq/L (ref 137–147)
Total Bilirubin: 0.2 mg/dL — ABNORMAL LOW (ref 0.3–1.2)
Total Protein: 6.5 g/dL (ref 6.0–8.3)

## 2013-07-05 MED ORDER — SODIUM CHLORIDE 0.9 % IV SOLN
INTRAVENOUS | Status: DC
  Administered 2013-07-05: 21:00:00 via INTRAVENOUS

## 2013-07-05 NOTE — ED Provider Notes (Signed)
CSN: [id number]     Arrival date & time 07/05/13  2038 History  This chart was scribed for Jennifer HornJohn M Taygen Newsome, MD by Dorothey Basemania Sutton, ED Scribe. This patient was seen in room APA01/[facility]01 and the patient's care was started at 9:10 PM.   Chief Complaint  Patient presents with  . possible labor    . Labor Eval   The history is provided by the patient. No language interpreter was used.   HPI Comments: Jennifer [name] is a 23 y.o. Female who is currently about [redacted] weeks pregnant (due date in 4 days), G#2P#1A#0  who presents to the Emergency Department complaining of possible, suprapubic contractions onset around 9 hours ago that have been progressively increasing in frequency over the last hour from about every 20 minutes to every 5-6 minutes. She reports some associated, intermittent, mild pain to the lower back. Patient denies any gush of fluid or water-breaking. She also reports some associated swelling to the left labia majora with a mild amount of vaginal discharge onset a few days ago. Patient states that she has been applying an anti-fungal cream to the area at home with mild, temporary relief of these symptoms. Patient reports that she last followed up with her OB (Dr. Emelda FearFerguson) 4 days ago and showed 2 cm dilation without effacement, but denies any problems with her current or last pregnancy. She denies fever, confusion, headache, emesis, shortness of breath. She reports that her blood type os O positive. Patient has no other pertinent medical history.   Past Medical History  Diagnosis Date  . Chlamydia   . BV (bacterial vaginosis)   . Nausea and vomiting in pregnancy prior to [redacted] weeks gestation 12/04/2012  . Vaginal discharge in pregnancy in first trimester 12/04/2012  . Pregnant 12/04/2012   History reviewed. No pertinent past surgical history. Family History  Problem Relation Age of Onset  . Hypertension Mother    History  Substance Use Topics  . Smoking status: Former Smoker    Types: Cigarettes   . Smokeless tobacco: Never Used  . Alcohol Use: No   OB History   Grav Para Term Preterm Abortions TAB SAB Ect Mult Living   2 1 1       1      Review of Systems  A complete 10 system review of systems was obtained and all systems are negative except as noted in the HPI and PMH.    Allergies  Review of patient's allergies indicates no known allergies.  Home Medications   Current Outpatient Rx  Name  Route  Sig  Dispense  Refill  . Iron-FA-B Cmp-C-Biot-Probiotic (FUSION PLUS) CAPS   Oral   Take 1 capsule by mouth daily.   1 capsule   prn   . Prenatal Vit-Fe Fumarate-FA (MULTIVITAMIN-PRENATAL) 27-0.8 MG TABS tablet   Oral   Take 1 tablet by mouth daily at 12 noon.   30 each   6    Triage Vitals: BP 120/66  Pulse 102  Temp(Src) 98 F (36.7 C) (Oral)  Resp 18  Ht 5\' 6"  (1.676 m)  Wt 140 lb (63.504 kg)  BMI 22.61 kg/m2  SpO2 96%  LMP 10/20/2012  Physical Exam  Nursing note and vitals reviewed. Constitutional:  Awake, alert, nontoxic appearance.  HENT:  Head: Atraumatic.  Eyes: Right eye exhibits no discharge. Left eye exhibits no discharge.  Neck: Neck supple.  Cardiovascular: Normal rate, regular rhythm and normal heart sounds.   Pulmonary/Chest: Effort normal and breath sounds normal.  She exhibits no tenderness.  Abdominal: Soft. There is no tenderness. There is no rebound.  Abdomen is gravid, soft, and non-tender.   Genitourinary:  1-2 cm dilated cervix with 50% effacement. Fetal heart tone is 120.   Exam was performed with pt's permission. Chaperone (scribe) was present. The exam was performed with no discomfort or complications.    Musculoskeletal: She exhibits edema. She exhibits no tenderness.  Baseline ROM, no obvious new focal weakness. Trace edema to bilateral lower extremities. No edema to the face or arms.   Neurological:  Mental status and motor strength appears baseline for patient and situation.  Skin: No rash noted.  Psychiatric: She has a  normal mood and affect.    ED Course  Procedures (including critical care time) Accepted for transfer at [facility] 2130 DIAGNOSTIC STUDIES: Oxygen Saturation is 96% on room air, normal by my interpretation.    COORDINATION OF CARE: 9:16 PM- Will consult with [facility] to have the [facility]. Discussed treatment plan with patient at bedside and patient verbalized agreement.     Labs Review Labs Reviewed  CBC WITH DIFFERENTIAL - Abnormal; Notable for the following:    RBC 2.91 (*)    Hemoglobin 9.5 (*)    HCT 27.4 (*)    All other components within normal limits  COMPREHENSIVE METABOLIC PANEL - Abnormal; Notable for the following:    Potassium 3.6 (*)    Albumin 2.7 (*)    Alkaline Phosphatase 385 (*)    Total Bilirubin 0.2 (*)    All other components within normal limits     MDM   Final diagnoses:  Threatened labor  Pregnancy    The patient appears reasonably stabilized for transfer considering the current resources, flow, and capabilities available in the ED at this time, and I doubt any other [facility] requiring further screening and/or treatment in the ED prior to transfer.  I personally performed the services described in this documentation, which was scribed in my presence. The recorded information has been reviewed and is accurate.     Jennifer Horn, MD 07/06/13 [phone]

## 2013-07-05 NOTE — MAU Note (Signed)
Patient transferred from [facility] for labor eval. Patient states she started contracting around 12 this afternoon. Contractions are now every 5-6 minutes. Denies leaking of fluid and vaginal bleeding. Reports good fetal movement.

## 2013-07-05 NOTE — Progress Notes (Signed)
[facility] RN told [facility] that sve=1-2/50/..says pt seems comfortable...ED physician will call OB attending.  [facility] gave [facility] RN the number to get in touch with attending.

## 2013-07-05 NOTE — ED Notes (Addendum)
Began having tightness, ? Contractions at 1200 today.  Has had consistent 20 min. Contractions over last hour.  Last OB visit on Wednesday showed 2 cm dilated w/out effacement.

## 2013-07-05 NOTE — Progress Notes (Signed)
This note also relates to the following rows which could not be included: Pulse Rate - Cannot attach notes to unvalidated device data SpO2 - Cannot attach notes to unvalidated device data   [facility] RN called [facility] and told of pt in to ER with c/o contractions; no vaginal bleeding or leaking of fluid, rn is starting iv, RROB asked for pt's cervix to be checked ASAP and to please call me back with results

## 2013-07-05 NOTE — Progress Notes (Signed)
ED physician spoke with Dr Debroah LoopArnold, plan to transfer pt to [facility] for labor evaluation. RROB spoke with Dr Debroah LoopArnold about plan of care and gave ED RN mau number to give report

## 2013-07-06 NOTE — Discharge Instructions (Signed)

## 2013-07-09 ENCOUNTER — Encounter: Payer: Self-pay | Admitting: Women's Health

## 2013-07-09 ENCOUNTER — Ambulatory Visit (INDEPENDENT_AMBULATORY_CARE_PROVIDER_SITE_OTHER): Payer: Medicaid Other | Admitting: Women's Health

## 2013-07-09 VITALS — BP 128/72 | Wt 145.0 lb

## 2013-07-09 DIAGNOSIS — Z1389 Encounter for screening for other disorder: Secondary | ICD-10-CM

## 2013-07-09 DIAGNOSIS — Z331 Pregnant state, incidental: Secondary | ICD-10-CM

## 2013-07-09 DIAGNOSIS — O26849 Uterine size-date discrepancy, unspecified trimester: Secondary | ICD-10-CM

## 2013-07-09 DIAGNOSIS — O99019 Anemia complicating pregnancy, unspecified trimester: Secondary | ICD-10-CM

## 2013-07-09 DIAGNOSIS — Z3480 Encounter for supervision of other normal pregnancy, unspecified trimester: Secondary | ICD-10-CM

## 2013-07-09 LAB — POCT URINALYSIS DIPSTICK
Glucose, UA: NEGATIVE
Ketones, UA: NEGATIVE
LEUKOCYTES UA: NEGATIVE
NITRITE UA: NEGATIVE
Protein, UA: NEGATIVE

## 2013-07-09 NOTE — Progress Notes (Signed)
Reports good fm. Denies lof, vb, uti s/s.  Regular uc's x 1hr, ~ 10-5120mins. Requests SVE: tight 3/50/-2, vtx, soft. Declines membrane sweeping. Discussed recommendation for IOL at 41wks d/t increased r/f IUFD @42wks , states she does not want to be induced and would rather wait until much closer to 42wks. S<D. FH 36cm today, had growth/afi scan 3/4 which revealed normal fluid- SDP 4.8 and efw 64%.  Reviewed labor s/s, fkc.  All questions answered. Will check fluid again/bpp on Friday.

## 2013-07-09 NOTE — Patient Instructions (Signed)
Call the office or go to [facility] if:  You begin to have strong, frequent contractions  Your water breaks.  Sometimes it is a big gush of fluid, sometimes it is just a trickle that keeps getting your panties wet or running down your legs  You have vaginal bleeding.  It is normal to have a small amount of spotting if your cervix was checked.   You don't feel your baby moving like normal.  If you don't, get you something to eat and drink and lay down and focus on feeling your baby move.  You should feel at least 10 movements in 2 hours.  If you don't, you should call the office or go to [facility].   Braxton Hicks Contractions Pregnancy is commonly associated with contractions of the uterus throughout the pregnancy. Towards the end of pregnancy (32 to 34 weeks), these contractions (Braxton Hicks) can develop more often and may become more forceful. This is not true labor because these contractions do not result in opening (dilatation) and thinning of the cervix. They are sometimes difficult to tell apart from true labor because these contractions can be forceful and people have different pain tolerances. You should not feel embarrassed if you go to the hospital with false labor. Sometimes, the only way to tell if you are in true labor is for your caregiver to follow the changes in the cervix. How to tell the difference between true and false labor:  False labor.  The contractions of false labor are usually shorter, irregular and not as hard as those of true labor.  They are often felt in the front of the lower abdomen and in the groin.  They may leave with walking around or changing positions while lying down.  They get weaker and are shorter lasting as time goes on.  These contractions are usually irregular.  They do not usually become progressively stronger, regular and closer together as with true labor.  True labor.  Contractions in true labor last 30 to 70 seconds,  become very regular, usually become more intense, and increase in frequency.  They do not go away with walking.  The discomfort is usually felt in the top of the uterus and spreads to the lower abdomen and low back.  True labor can be determined by your caregiver with an exam. This will show that the cervix is dilating and getting thinner. If there are no prenatal problems or other health problems associated with the pregnancy, it is completely safe to be sent home with false labor and await the onset of true labor. HOME CARE INSTRUCTIONS   Keep up with your usual exercises and instructions.  Take medications as directed.  Keep your regular prenatal appointment.  Eat and drink lightly if you think you are going into labor.  If BH contractions are making you uncomfortable:  Change your activity position from lying down or resting to walking/walking to resting.  Sit and rest in a tub of warm water.  Drink 2 to 3 glasses of water. Dehydration may cause B-H contractions.  Do slow and deep breathing several times an hour. SEEK IMMEDIATE MEDICAL CARE IF:   Your contractions continue to become stronger, more regular, and closer together.  You have a gushing, burst or leaking of fluid from the vagina.  An oral temperature above 102 F (38.9 C) develops.  You have passage of blood-tinged mucus.  You develop vaginal bleeding.  You develop continuous belly (abdominal) pain.  You have   low back pain that you never had before.  You feel the baby's head pushing down causing pelvic pressure.  The baby is not moving as much as it used to. Document Released: 04/17/2005 Document Revised: 07/10/2011 Document Reviewed: 01/27/2013 [employer] Patient Information 2014 [employer].  

## 2013-07-11 ENCOUNTER — Ambulatory Visit (INDEPENDENT_AMBULATORY_CARE_PROVIDER_SITE_OTHER): Payer: Medicaid Other | Admitting: Obstetrics & Gynecology

## 2013-07-11 ENCOUNTER — Other Ambulatory Visit: Payer: Self-pay | Admitting: Obstetrics & Gynecology

## 2013-07-11 ENCOUNTER — Ambulatory Visit (INDEPENDENT_AMBULATORY_CARE_PROVIDER_SITE_OTHER): Payer: Medicaid Other

## 2013-07-11 ENCOUNTER — Encounter: Payer: Self-pay | Admitting: Obstetrics & Gynecology

## 2013-07-11 VITALS — BP 108/64 | Wt 146.0 lb

## 2013-07-11 DIAGNOSIS — O480 Post-term pregnancy: Secondary | ICD-10-CM

## 2013-07-11 DIAGNOSIS — O99019 Anemia complicating pregnancy, unspecified trimester: Secondary | ICD-10-CM

## 2013-07-11 DIAGNOSIS — O368190 Decreased fetal movements, unspecified trimester, not applicable or unspecified: Secondary | ICD-10-CM

## 2013-07-11 DIAGNOSIS — Z3480 Encounter for supervision of other normal pregnancy, unspecified trimester: Secondary | ICD-10-CM

## 2013-07-11 NOTE — Progress Notes (Signed)
U/S(40+2wks)-vtx active fetus, BPP 8/8, fluid wnl AFI-10.6cm (single Deepest Pocket-7.0cm), FHR- 121 bpm, Female fetus, EFW 8 lb 4 oz (70th%tile), UA Doppler RI-0.60 & 0.50, anterior Gr 2 placenta

## 2013-07-11 NOTE — Progress Notes (Signed)
Sonogram is reviewed, report done. Fetal status is reassuring  BP weight and urine results all reviewed and noted. Patient reports good fetal movement, denies any bleeding and no rupture of membranes symptoms or regular contractions. Patient is without complaints. All questions were answered.  Pt considered stripping membranes today but really her cervix is too thick so I withdrew the offer  NST Tuesday for post dates

## 2013-07-12 ENCOUNTER — Encounter: Payer: Self-pay | Admitting: Women's Health

## 2013-07-15 ENCOUNTER — Ambulatory Visit (INDEPENDENT_AMBULATORY_CARE_PROVIDER_SITE_OTHER): Payer: Medicaid Other | Admitting: Advanced Practice Midwife

## 2013-07-15 ENCOUNTER — Telehealth (HOSPITAL_COMMUNITY): Payer: Self-pay | Admitting: *Deleted

## 2013-07-15 ENCOUNTER — Encounter (HOSPITAL_COMMUNITY): Payer: Self-pay | Admitting: *Deleted

## 2013-07-15 ENCOUNTER — Inpatient Hospital Stay (HOSPITAL_COMMUNITY)
Admission: AD | Admit: 2013-07-15 | Discharge: 2013-07-17 | DRG: 775 | Disposition: A | Discharge status: Home / Self Care | Payer: Medicaid Other | Source: Ambulatory Visit | Attending: Family Medicine | Admitting: Family Medicine

## 2013-07-15 ENCOUNTER — Encounter: Payer: Self-pay | Admitting: Advanced Practice Midwife

## 2013-07-15 VITALS — BP 120/56 | Wt 147.0 lb

## 2013-07-15 DIAGNOSIS — O480 Post-term pregnancy: Secondary | ICD-10-CM

## 2013-07-15 DIAGNOSIS — Z1389 Encounter for screening for other disorder: Secondary | ICD-10-CM

## 2013-07-15 DIAGNOSIS — Z87891 Personal history of nicotine dependence: Secondary | ICD-10-CM

## 2013-07-15 DIAGNOSIS — Z331 Pregnant state, incidental: Secondary | ICD-10-CM

## 2013-07-15 DIAGNOSIS — Z3480 Encounter for supervision of other normal pregnancy, unspecified trimester: Secondary | ICD-10-CM

## 2013-07-15 DIAGNOSIS — O99019 Anemia complicating pregnancy, unspecified trimester: Secondary | ICD-10-CM

## 2013-07-15 DIAGNOSIS — O2390 Unspecified genitourinary tract infection in pregnancy, unspecified trimester: Secondary | ICD-10-CM

## 2013-07-15 DIAGNOSIS — O26849 Uterine size-date discrepancy, unspecified trimester: Secondary | ICD-10-CM

## 2013-07-15 LAB — POCT URINALYSIS DIPSTICK
GLUCOSE UA: NEGATIVE
Ketones, UA: NEGATIVE
NITRITE UA: NEGATIVE
Protein, UA: NEGATIVE
RBC UA: NEGATIVE

## 2013-07-15 MED ORDER — METRONIDAZOLE 500 MG PO TABS: 500.0000 mg | ORAL_TABLET | Freq: Two times a day (BID) | ORAL | Status: DC

## 2013-07-15 NOTE — Progress Notes (Signed)
NST for postdates reactive.  C/O vaginal discharge with odor.  SSE: yellow frothy, wet prep + clue, negative trich, wbc, yeast.   IOL for 3/21 0700.  Will f/u Friday for AFI/BPP and Monday for NST/repeat GBS Monday (need 2 days for culture).

## 2013-07-15 NOTE — Telephone Encounter (Signed)
Preadmission screen  

## 2013-07-15 NOTE — Patient Instructions (Signed)
If you are still pregnant on 3/21 at 7 am, come to [facility] Admissions Unit ([address] [place], [place]) to start your induction!  Eat a light meal before you come.  Daisey MustGood Luck!!

## 2013-07-15 NOTE — MAU Note (Signed)
Pt presents by EMS with contractions. Pt denies LOF

## 2013-07-16 ENCOUNTER — Encounter (HOSPITAL_COMMUNITY): Payer: Self-pay | Admitting: *Deleted

## 2013-07-16 DIAGNOSIS — Z87891 Personal history of nicotine dependence: Secondary | ICD-10-CM

## 2013-07-16 LAB — CBC
HCT: 29.8 % — ABNORMAL LOW (ref 36.0–46.0)
Hemoglobin: 10.1 g/dL — ABNORMAL LOW (ref 12.0–15.0)
MCH: 31.5 pg (ref 26.0–34.0)
MCHC: 33.9 g/dL (ref 30.0–36.0)
MCV: 92.8 fL (ref 78.0–100.0)
PLATELETS: 180 10*3/uL (ref 150–400)
RBC: 3.21 MIL/uL — AB (ref 3.87–5.11)
RDW: 13.2 % (ref 11.5–15.5)
WBC: 15.3 10*3/uL — ABNORMAL HIGH (ref 4.0–10.5)

## 2013-07-16 LAB — RPR: RPR: NONREACTIVE

## 2013-07-16 MED ORDER — OXYCODONE-ACETAMINOPHEN 5-325 MG PO TABS: 1.0000 | ORAL_TABLET | ORAL | Status: DC | PRN

## 2013-07-16 MED ORDER — TETANUS-DIPHTH-ACELL PERTUSSIS 5-2.5-18.5 LF-MCG/0.5 IM SUSP: 0.5000 mL | Freq: Once | INTRAMUSCULAR | Status: DC

## 2013-07-16 MED ORDER — ONDANSETRON HCL 4 MG/2ML IJ SOLN: 4.0000 mg | Freq: Four times a day (QID) | INTRAMUSCULAR | Status: DC | PRN

## 2013-07-16 MED ORDER — MISOPROSTOL 200 MCG PO TABS
800.0000 ug | ORAL_TABLET | Freq: Once | ORAL | Status: AC
  Administered 2013-07-16: 800 ug via VAGINAL

## 2013-07-16 MED ORDER — METRONIDAZOLE 500 MG PO TABS
500.0000 mg | ORAL_TABLET | Freq: Two times a day (BID) | ORAL | Status: DC
  Filled 2013-07-16 (×2): qty 1

## 2013-07-16 MED ORDER — WITCH HAZEL-GLYCERIN EX PADS: 1.0000 "application " | MEDICATED_PAD | CUTANEOUS | Status: DC | PRN

## 2013-07-16 MED ORDER — BENZOCAINE-MENTHOL 20-0.5 % EX AERO
1.0000 "application " | INHALATION_SPRAY | CUTANEOUS | Status: DC | PRN
  Administered 2013-07-16: 1 via TOPICAL
  Filled 2013-07-16: qty 56

## 2013-07-16 MED ORDER — PRENATAL MULTIVITAMIN CH
1.0000 | ORAL_TABLET | Freq: Every day | ORAL | Status: DC
  Administered 2013-07-17: 1 via ORAL
  Filled 2013-07-16: qty 1

## 2013-07-16 MED ORDER — LANOLIN HYDROUS EX OINT: TOPICAL_OINTMENT | CUTANEOUS | Status: DC | PRN

## 2013-07-16 MED ORDER — MISOPROSTOL 200 MCG PO TABS
ORAL_TABLET | ORAL | Status: AC
  Filled 2013-07-16: qty 4

## 2013-07-16 MED ORDER — IBUPROFEN 600 MG PO TABS
600.0000 mg | ORAL_TABLET | Freq: Four times a day (QID) | ORAL | Status: DC
  Administered 2013-07-16 – 2013-07-17 (×4): 600 mg via ORAL
  Filled 2013-07-16 (×4): qty 1

## 2013-07-16 MED ORDER — ONDANSETRON HCL 4 MG/2ML IJ SOLN
4.0000 mg | INTRAMUSCULAR | Status: DC | PRN
Start: 2013-07-16 — End: 2013-07-17

## 2013-07-16 MED ORDER — OXYTOCIN BOLUS FROM INFUSION
500.0000 mL | INTRAVENOUS | Status: DC
  Administered 2013-07-16: 500 mL via INTRAVENOUS

## 2013-07-16 MED ORDER — MISOPROSTOL 200 MCG PO TABS: 800.0000 ug | ORAL_TABLET | Freq: Once | ORAL | Status: DC

## 2013-07-16 MED ORDER — IBUPROFEN 600 MG PO TABS
600.0000 mg | ORAL_TABLET | Freq: Four times a day (QID) | ORAL | Status: DC | PRN
  Administered 2013-07-16: 600 mg via ORAL
  Filled 2013-07-16: qty 1

## 2013-07-16 MED ORDER — LIDOCAINE HCL (PF) 1 % IJ SOLN
30.0000 mL | INTRAMUSCULAR | Status: DC | PRN
  Filled 2013-07-16: qty 30

## 2013-07-16 MED ORDER — SIMETHICONE 80 MG PO CHEW: 80.0000 mg | CHEWABLE_TABLET | ORAL | Status: DC | PRN

## 2013-07-16 MED ORDER — LACTATED RINGERS IV SOLN: 500.0000 mL | INTRAVENOUS | Status: DC | PRN

## 2013-07-16 MED ORDER — LACTATED RINGERS IV SOLN
INTRAVENOUS | Status: DC
  Administered 2013-07-16: via INTRAVENOUS

## 2013-07-16 MED ORDER — PRENATAL 27-0.8 MG PO TABS
1.0000 | ORAL_TABLET | Freq: Every day | ORAL | Status: DC
  Filled 2013-07-16: qty 1

## 2013-07-16 MED ORDER — DIBUCAINE 1 % RE OINT: 1.0000 "application " | TOPICAL_OINTMENT | RECTAL | Status: DC | PRN

## 2013-07-16 MED ORDER — ONDANSETRON HCL 4 MG PO TABS: 4.0000 mg | ORAL_TABLET | ORAL | Status: DC | PRN

## 2013-07-16 MED ORDER — OXYTOCIN 40 UNITS IN LACTATED RINGERS INFUSION - SIMPLE MED
62.5000 mL/h | INTRAVENOUS | Status: DC
  Filled 2013-07-16: qty 1000

## 2013-07-16 MED ORDER — SENNOSIDES-DOCUSATE SODIUM 8.6-50 MG PO TABS
2.0000 | ORAL_TABLET | ORAL | Status: DC
  Administered 2013-07-16: 2 via ORAL
  Filled 2013-07-16: qty 2

## 2013-07-16 MED ORDER — ACETAMINOPHEN 325 MG PO TABS: 650.0000 mg | ORAL_TABLET | ORAL | Status: DC | PRN

## 2013-07-16 MED ORDER — CITRIC ACID-SODIUM CITRATE 334-500 MG/5ML PO SOLN: 30.0000 mL | ORAL | Status: DC | PRN

## 2013-07-16 MED ORDER — METHYLERGONOVINE MALEATE 0.2 MG/ML IJ SOLN
INTRAMUSCULAR | Status: AC
  Filled 2013-07-16: qty 1

## 2013-07-16 MED ORDER — PRENATAL MULTIVITAMIN CH: 1.0000 | ORAL_TABLET | Freq: Every day | ORAL | Status: DC

## 2013-07-16 MED ORDER — DIPHENHYDRAMINE HCL 25 MG PO CAPS: 25.0000 mg | ORAL_CAPSULE | Freq: Four times a day (QID) | ORAL | Status: DC | PRN

## 2013-07-16 MED ORDER — ZOLPIDEM TARTRATE 5 MG PO TABS: 5.0000 mg | ORAL_TABLET | Freq: Every evening | ORAL | Status: DC | PRN

## 2013-07-16 NOTE — H&P (Signed)
Attestation of Attending Supervision of Advanced Practitioner (PA/CNM/NP): Evaluation and management procedures were performed by the Advanced Practitioner under my supervision and collaboration.  I have reviewed the Advanced Practitioner's note and chart, and I agree with the management and plan.  Ahmet Schank S, MD [facility] Attending 07/16/2013 6:49 AM   

## 2013-07-16 NOTE — Progress Notes (Signed)
Jennifer [name] is a 23 y.o. G2P1001 at 382w0d admitted for active labor  Subjective: Pt breathing with contractions, coping well.    Objective: BP 153/61  Pulse 108  Temp(Src) 97.4 F (36.3 C) (Oral)  Resp 18  Ht 5\' 6"  (1.676 m)  Wt 66.679 kg (147 lb)  BMI 23.74 kg/m2  LMP 10/20/2012     Blood pressure retake 130s/60s per RN--will enter in Epic  FHT:  FHR: 135 bpm, variability: moderate,  accelerations:  Present,  decelerations:  Absent UC:   regular, every 2-3 minutes SVE:   Dilation: 7.5 Effacement (%): 100 Station: +1 Exam by:: Lucas MallowKlashley, RN MHR tracing while pt sitting up, pulse ox applied, EFM readjusted for improved FHR tracing  Labs: Lab Results  Component Value Date   WBC 15.3* 07/16/2013   HGB 10.1* 07/16/2013   HCT 29.8* 07/16/2013   MCV 92.8 07/16/2013   PLT 180 07/16/2013    Assessment / Plan: Spontaneous labor, progressing normally  Labor: Progressing normally Preeclampsia:  n/a Fetal Wellbeing:  Category I Pain Control:  Labor support without medications I/D:  n/a Anticipated MOD:  NSVD  Jennifer [name], Jennifer [name] 07/16/2013, 4:06 AM

## 2013-07-16 NOTE — H&P (Signed)
Jennifer [name] is a 23 y.o. female G2P1001 @ [redacted]w[redacted]d of FT presenting for spontaneous onset of labor. She began having contractions at 8PM. Cervix measured 3 cm in MAU at arrival and 4 cm 1 hour later. No VB, LOF. +FM. No other concerns. Had been prescribed Flagyl for BV today, but had not started taking.   PNC at FT, routine except 1st trimester n/v. Prior SVD at term 8lb 1oz w/o complications. EFW 8.4oz. EDD - 3.11.15  History OB History   Grav Para Term Preterm Abortions TAB SAB Ect Mult Living   2 1 1       1      Past Medical History  Diagnosis Date  . Chlamydia   . BV (bacterial vaginosis)   . Nausea and vomiting in pregnancy prior to [redacted] weeks gestation 12/04/2012  . Vaginal discharge in pregnancy in first trimester 12/04/2012  . Pregnant 12/04/2012   Past Surgical History  Procedure Laterality Date  . No past surgeries     Family History: family history includes Hypertension in her mother. Social History:  reports that she quit smoking about 3 years ago. Her smoking use included Cigarettes. She smoked 0.00 packs per day. She has never used smokeless tobacco. She reports that she does not drink alcohol or use illicit drugs.   Prenatal Transfer Tool  Maternal Diabetes: No Genetic Screening: Normal Maternal Ultrasounds/Referrals: Normal Fetal Ultrasounds or other Referrals:  None Maternal Substance Abuse:  No Significant Maternal Medications:  None Significant Maternal Lab Results:  Lab values include: Group B Strep negative Other Comments:  None  Review of Systems  Constitutional: Negative for fever and chills.  Respiratory: Negative for shortness of breath.   Cardiovascular: Negative for chest pain.  Neurological: Negative for headaches.    Dilation: 4 Effacement (%): 80 Station: -2 Exam by:: B Mosca Blood pressure 118/75, pulse 114, temperature 98.6 F (37 C), temperature source Oral, resp. rate 18, last menstrual period 10/20/2012. Maternal Exam:  Uterine  Assessment: Contraction strength is moderate.  Contraction duration is 90 seconds. Contraction frequency is regular.   Cervix: Cervix evaluated by digital exam.     Fetal Exam Fetal Monitor Review: Mode: ultrasound.   Baseline rate: 140.  Variability: moderate (6-25 bpm).   Pattern: accelerations present and no decelerations.    Fetal State Assessment: Category I - tracings are normal.     Physical Exam  Constitutional: She is oriented to person, place, and time. She appears well-developed and well-nourished. No distress.  HENT:  Head: Normocephalic and atraumatic.  Cardiovascular: Normal rate and regular rhythm.   Respiratory: Effort normal and breath sounds normal. No respiratory distress.  Neurological: She is alert and oriented to person, place, and time.  Psychiatric: She has a normal mood and affect. Her behavior is normal.    Prenatal labs: ABO, Rh: O/POS/-- (08/18 1545) Antibody: NEG (12/29 0910) Rubella: 8.65 (08/18 1545) RPR: NON REAC (12/29 0910)  HBsAg: NEGATIVE (08/18 1545)  HIV: NON REACTIVE (12/29 0910)  GBS: NEGATIVE (02/16 1033)   Assessment/Plan: 23 y.o. G2P1001 @ [redacted]w[redacted]d presents for active labor.   #Labor: 1st stage active phase. Regular contractions. Expectant management.  Expect SVD of EFW 8lb4oz.  #Pain: No meds #FWB: Category 1 reassuring #ID:  GBS neg #Feeding: Breast #MOC: OCP, discussed LARC, not interested # Support in room, Tevin (FOB) and Aggie Cosier (Mother-in-law)      Michaelene Song 07/16/2013, 12:04 AM  I have seen this patient and agree with the above resident's note.  LEFTWICH-KIRBY, Rahkim Rabalais Certified Nurse-Midwife

## 2013-07-16 NOTE — Progress Notes (Signed)
Jennifer [name] is a 23 y.o. G2P1001 at 8721w0d admitted for active labor  Subjective: Pt breathing and moving with contractions, which are regular  Objective: BP 136/62  Pulse 108  Temp(Src) 97.9 F (36.6 C) (Oral)  Resp 20  Ht 5\' 6"  (1.676 m)  Wt 66.679 kg (147 lb)  BMI 23.74 kg/m2  LMP 10/20/2012     FHT:  FHR: 125 bpm, variability: moderate,  accelerations:  Present,  decelerations:  Present early UC:   regular, every 2 minutes SVE:   Dilation: 8 Effacement (%): 100 Station: +2 Exam by:: Dr. Hazle CocaHall/ Lisa cmn  MHR tracing while pt sitting up, pulse ox applied, EFM readjusted for improved FHR tracing  Labs: Lab Results  Component Value Date   WBC 15.3* 07/16/2013   HGB 10.1* 07/16/2013   HCT 29.8* 07/16/2013   MCV 92.8 07/16/2013   PLT 180 07/16/2013    Assessment / Plan: Spontaneous labor, progressing normally  Labor: Progressing normally Preeclampsia:  n/a Fetal Wellbeing:  Category II; reassuring, good variability Pain Control:  Labor support without medications I/D:  n/a Anticipated MOD:  NSVD  [name], Eldene Plocher C 07/16/2013, 5:45 AM

## 2013-07-17 MED ORDER — IBUPROFEN 600 MG PO TABS: 600.0000 mg | ORAL_TABLET | Freq: Four times a day (QID) | ORAL | Status: DC

## 2013-07-17 NOTE — Discharge Summary (Signed)
Obstetric Discharge Summary Reason for Admission: onset of labor Prenatal Procedures: ultrasound Intrapartum Procedures: spontaneous vaginal delivery Postpartum Procedures: none Complications-Operative and Postpartum: none Hemoglobin  Date Value Ref Range Status  07/16/2013 10.1* 12.0 - 15.0 g/dL Final     HCT  Date Value Ref Range Status  07/16/2013 29.8* 36.0 - 46.0 % Final    Physical Exam:  General: alert, cooperative and no distress Lochia: appropriate Uterine Fundus: firm Incision: N/A  DVT Evaluation: No evidence of DVT seen on physical exam.  Discharge Diagnoses: Term Pregnancy-delivered  Discharge Information: Date: 07/17/2013 Activity: pelvic rest Diet: routine Medications: Ibuprofen Condition: stable Instructions: refer to practice specific booklet Discharge to: home Follow-up Information   Follow up with [facility] In 6 weeks.   Contact information:   [address] [place] [name] [id number] [phone]     Delivery Note At 7:28 AM a viable female was delivered via Vaginal, Spontaneous Delivery (Presentation: Middle Occiput Anterior).  APGAR: 9, 9; weight .   Placenta status: Intact, Spontaneous.  Cord: 3 vessels with the following complications: None.  Cord pH: Not sent Anesthesia: None  Episiotomy: None Lacerations: None Suture Repair: N/A Est. Blood Loss (mL): 500  Mom to postpartum.  Baby to Couplet care / Skin to Skin.  Jennifer [name] presented with SOL and progressed to complete. She was actively pushing for almost 1 hour when Dr. Ike [name] was able to reduce anterior cervical lip. Shortly after head presented. Loose nuchal cord reduced with SVD of crying female. Father cut cord. EBL at 500 mL, vigorous fundal massage with pitocin started after delivery and 800 mcg cytotec PR. Bleeding had ceased, uterus was firm and midline. No lacerations. Dr. Ike [name] reduced cervical lip, massaged fundus, and supervised delivery. Counts correct x2. Continue  routine post-partum care.  Jennifer [name], Jennifer [name] 07/16/2013, 7:57 AM   I was present for the entire delivery and agree with resident's note and plan of care.  Jennifer ScaleMichael Ryan Odom, MD OB Fellow 07/16/2013 1:24 PM  Newborn Data: Live born female  Birth Weight: 8 lb 1.8 oz (3680 g) APGAR: 9, 9  Home with mother.  Brief Hospital Course: Jennifer [name] is a [id number] who underwent  SVD without complications.  For further details, please refer to the delivery/operative note. Uncomplicated postpartum course. At time of discharge, pain was controlled on oral pain medications; she had appropriate lochia and was ambulating, voiding without difficulty, tolerating regular diet and passing flatus. She was deemed stable for discharge to home.   Jennifer [name], Jennifer [name] 07/17/2013, 7:55 AM  I have seen and examined this patient and I agree with the above. Jennifer [name] 9:03 AM 07/17/2013

## 2013-07-17 NOTE — Discharge Instructions (Signed)

## 2013-07-17 NOTE — Lactation Note (Signed)
This note was copied from the chart of Jennifer [name] Kral. Lactation Consultation Note Follow up consult:  Baby Jennifer 5327 hours old. Mother nipples are sore, provided and comfort gels and reviewed use. Denies any other problems. Encouraged mother to call if further assistance is needed. Patient Name: Jennifer [name] Karaffa ZOXWR'UToday's Date: 07/17/2013     Maternal Data    Feeding    LATCH Score/Interventions                      Lactation Tools Discussed/Used     Consult Status Consult Status: Complete    Hardie PulleyBerkelhammer, Ruth Boschen 07/17/2013, 11:27 AM

## 2013-07-18 ENCOUNTER — Telehealth: Payer: Self-pay | Admitting: Obstetrics & Gynecology

## 2013-07-18 ENCOUNTER — Other Ambulatory Visit: Payer: Medicaid Other

## 2013-07-18 ENCOUNTER — Encounter: Payer: Medicaid Other | Admitting: Obstetrics & Gynecology

## 2013-07-19 ENCOUNTER — Inpatient Hospital Stay (HOSPITAL_COMMUNITY): Admission: RE | Admit: 2013-07-19 | Payer: Medicaid Other | Source: Ambulatory Visit

## 2013-07-23 ENCOUNTER — Inpatient Hospital Stay (HOSPITAL_COMMUNITY): Payer: Medicaid Other

## 2013-08-08 NOTE — Telephone Encounter (Signed)
close

## 2013-08-25 ENCOUNTER — Ambulatory Visit (INDEPENDENT_AMBULATORY_CARE_PROVIDER_SITE_OTHER): Payer: Medicaid Other | Admitting: Adult Health

## 2013-08-25 ENCOUNTER — Encounter: Payer: Self-pay | Admitting: Adult Health

## 2013-08-25 DIAGNOSIS — Z3202 Encounter for pregnancy test, result negative: Secondary | ICD-10-CM

## 2013-08-25 DIAGNOSIS — Z3200 Encounter for pregnancy test, result unknown: Secondary | ICD-10-CM

## 2013-08-25 DIAGNOSIS — Z309 Encounter for contraceptive management, unspecified: Secondary | ICD-10-CM

## 2013-08-25 LAB — POCT URINE PREGNANCY: Preg Test, Ur: NEGATIVE

## 2013-08-25 MED ORDER — NORGESTIMATE-ETH ESTRADIOL 0.25-35 MG-MCG PO TABS: 1.0000 | ORAL_TABLET | Freq: Every day | ORAL | Status: DC

## 2013-08-25 NOTE — Progress Notes (Signed)
Patient ID: Jennifer [name], female   DOB: 07-21-1990, 23 y.o.   MRN: [id number] Jennifer [name] is a 23 year old white female in for postpartum visit.  Delivery Date:07/16/13 baby girl 8 lbs, 2 ozs THEA Method of Delivery:vaginal delivery   Sexual Activity since delivery: Yes with condoms  Method of Feeding: Tried breast now, bottle feeding  Number of weeks bleeding post delivery: 2.5 weeks  Review of Systems: Patient denies any headaches, blurred vision, shortness of breath, chest pain, abdominal pain, problems with bowel movements, urination, or intercourse. No joint pain or swelling,no mood swings.  Depression Score: 0 BP 110/66  Ht 5\' 5"  (1.651 m)  Wt 119 lb (53.978 kg)  BMI 19.80 kg/m2  Breastfeeding? No Pelvic Exam:   External genitalia is normal in appearance.  The vagina is normal in appearance. The cervix is bulbous.  Uterus is felt to be normal size, shape, and contour, well involuted.  No adnexal masses or tenderness noted. UPT negative.  Impression:  Status post delivery, post partum check, depression screening, contraceptive management.   Plan:   Rx sprintec disp 1 pack take 1 daily refill x 1 year, can start today Return in 6 months for pap and physical. Use condoms x 1 pack of pills

## 2013-08-25 NOTE — Patient Instructions (Signed)
Oral Contraception Use Oral contraceptive pills (OCPs) are medicines taken to prevent pregnancy. OCPs work by preventing the ovaries from releasing eggs. The hormones in OCPs also cause the cervical mucus to thicken, preventing the sperm from entering the uterus. The hormones also cause the uterine lining to become thin, not allowing a fertilized egg to attach to the inside of the uterus. OCPs are highly effective when taken exactly as prescribed. However, OCPs do not prevent sexually transmitted diseases (STDs). Safe sex practices, such as using condoms along with an OCP, can help prevent STDs. Before taking OCPs, you may have a physical exam and Pap test. Your health care provider may also order blood tests if necessary. Your health care provider will make sure you are a good candidate for oral contraception. Discuss with your health care provider the possible side effects of the OCP you may be prescribed. When starting an OCP, it can take 2 to 3 months for the body to adjust to the changes in hormone levels in your body.  HOW TO TAKE ORAL CONTRACEPTIVE PILLS Your health care provider may advise you on how to start taking the first cycle of OCPs. Otherwise, you can:   Start on day 1 of your menstrual period. You will not need any backup contraceptive protection with this start time.   Start on the first Sunday after your menstrual period or the day you get your prescription. In these cases, you will need to use backup contraceptive protection for the first week.   Start the pill at any time of your cycle. If you take the pill within 5 days of the start of your period, you are protected against pregnancy right away. In this case, you will not need a backup form of birth control. If you start at any other time of your menstrual cycle, you will need to use another form of birth control for 7 days. If your OCP is the type called a minipill, it will protect you from pregnancy after taking it for 2 days (48  hours). After you have started taking OCPs:   If you forget to take 1 pill, take it as soon as you remember. Take the next pill at the regular time.   If you miss 2 or more pills, call your health care provider because different pills have different instructions for missed doses. Use backup birth control until your next menstrual period starts.   If you use a 28-day pack that contains inactive pills and you miss 1 of the last 7 pills (pills with no hormones), it will not matter. Throw away the rest of the nonhormone pills and start a new pill pack.  No matter which day you start the OCP, you will always start a new pack on that same day of the week. Have an extra pack of OCPs and a backup contraceptive method available in case you miss some pills or lose your OCP pack.  HOME CARE INSTRUCTIONS   Do not smoke.   Always use a condom to protect against STDs. OCPs do not protect against STDs.   Use a calendar to mark your menstrual period days.   Read the information and directions that came with your OCP. Talk to your health care provider if you have questions.  SEEK MEDICAL CARE IF:   You develop nausea and vomiting.   You have abnormal vaginal discharge or bleeding.   You develop a rash.   You miss your menstrual period.   You are losing   your hair.   You need treatment for mood swings or depression.   You get dizzy when taking the OCP.   You develop acne from taking the OCP.   You become pregnant.  SEEK IMMEDIATE MEDICAL CARE IF:   You develop chest pain.   You develop shortness of breath.   You have an uncontrolled or severe headache.   You develop numbness or slurred speech.   You develop visual problems.   You develop pain, redness, and swelling in the legs.  Document Released: 04/06/2011 Document Revised: 12/18/2012 Document Reviewed: 10/06/2012 [facility] Patient Information 2014 [place], [place]. Start today with oCs Follow up in  6 months  for pap

## 2013-12-06 ENCOUNTER — Encounter (HOSPITAL_COMMUNITY): Payer: Self-pay | Admitting: Emergency Medicine

## 2013-12-06 ENCOUNTER — Emergency Department (HOSPITAL_COMMUNITY)
Admission: EM | Admit: 2013-12-06 | Discharge: 2013-12-06 | Disposition: A | Discharge status: Home / Self Care | Payer: Medicaid Other | Attending: Emergency Medicine | Admitting: Emergency Medicine

## 2013-12-06 DIAGNOSIS — Z87891 Personal history of nicotine dependence: Secondary | ICD-10-CM | POA: Insufficient documentation

## 2013-12-06 DIAGNOSIS — Z8619 Personal history of other infectious and parasitic diseases: Secondary | ICD-10-CM | POA: Insufficient documentation

## 2013-12-06 DIAGNOSIS — S01501A Unspecified open wound of lip, initial encounter: Secondary | ICD-10-CM | POA: Insufficient documentation

## 2013-12-06 DIAGNOSIS — Z8742 Personal history of other diseases of the female genital tract: Secondary | ICD-10-CM | POA: Insufficient documentation

## 2013-12-06 DIAGNOSIS — S01511A Laceration without foreign body of lip, initial encounter: Secondary | ICD-10-CM

## 2013-12-06 DIAGNOSIS — Z79899 Other long term (current) drug therapy: Secondary | ICD-10-CM | POA: Insufficient documentation

## 2013-12-06 DIAGNOSIS — Z792 Long term (current) use of antibiotics: Secondary | ICD-10-CM | POA: Insufficient documentation

## 2013-12-06 NOTE — Discharge Instructions (Signed)
Return to the emergency department for increased redness, purulent drainage, or increasing pain.   Facial Laceration  A facial laceration is a cut on the face. These injuries can be painful and cause bleeding. Lacerations usually heal quickly, but they need special care to reduce scarring. DIAGNOSIS  Your health care provider will take a medical history, ask for details about how the injury occurred, and examine the wound to determine how deep the cut is. TREATMENT  Some facial lacerations may not require closure. Others may not be able to be closed because of an increased risk of infection. The risk of infection and the chance for successful closure will depend on various factors, including the amount of time since the injury occurred. The wound may be cleaned to help prevent infection. If closure is appropriate, pain medicines may be given if needed. Your health care provider will use stitches (sutures), wound glue (adhesive), or skin adhesive strips to repair the laceration. These tools bring the skin edges together to allow for faster healing and a better cosmetic outcome. If needed, you may also be given a tetanus shot. HOME CARE INSTRUCTIONS  Only take over-the-counter or prescription medicines as directed by your health care provider.  Follow your health care provider's instructions for wound care. These instructions will vary depending on the technique used for closing the wound. For Sutures:  Keep the wound clean and dry.   If you were given a bandage (dressing), you should change it at least once a day. Also change the dressing if it becomes wet or dirty, or as directed by your health care provider.   Wash the wound with soap and water 2 times a day. Rinse the wound off with water to remove all soap. Pat the wound dry with a clean towel.   After cleaning, apply a thin layer of the antibiotic ointment recommended by your health care provider. This will help prevent infection and  keep the dressing from sticking.   You may shower as usual after the first 24 hours. Do not soak the wound in water until the sutures are removed.   Get your sutures removed as directed by your health care provider. With facial lacerations, sutures should usually be taken out after 4-5 days to avoid stitch marks.   Wait a few days after your sutures are removed before applying any makeup. For Skin Adhesive Strips:  Keep the wound clean and dry.   Do not get the skin adhesive strips wet. You may bathe carefully, using caution to keep the wound dry.   If the wound gets wet, pat it dry with a clean towel.   Skin adhesive strips will fall off on their own. You may trim the strips as the wound heals. Do not remove skin adhesive strips that are still stuck to the wound. They will fall off in time.  For Wound Adhesive:  You may briefly wet your wound in the shower or bath. Do not soak or scrub the wound. Do not swim. Avoid periods of heavy sweating until the skin adhesive has fallen off on its own. After showering or bathing, gently pat the wound dry with a clean towel.   Do not apply liquid medicine, cream medicine, ointment medicine, or makeup to your wound while the skin adhesive is in place. This may loosen the film before your wound is healed.   If a dressing is placed over the wound, be careful not to apply tape directly over the skin adhesive. This may  cause the adhesive to be pulled off before the wound is healed.   Avoid prolonged exposure to sunlight or tanning lamps while the skin adhesive is in place.  The skin adhesive will usually remain in place for 5-10 days, then naturally fall off the skin. Do not pick at the adhesive film.  After Healing: Once the wound has healed, cover the wound with sunscreen during the day for 1 full year. This can help minimize scarring. Exposure to ultraviolet light in the first year will darken the scar. It can take 1-2 years for the scar to  lose its redness and to heal completely.  SEEK IMMEDIATE MEDICAL CARE IF:  You have redness, pain, or swelling around the wound.   You see ayellowish-white fluid (pus) coming from the wound.   You have chills or a fever.  MAKE SURE YOU:  Understand these instructions.  Will watch your condition.  Will get help right away if you are not doing well or get worse. Document Released: 05/25/2004 Document Revised: 02/05/2013 Document Reviewed: 11/28/2012 [facility] Patient Information 2015 [place], [place]. This information is not intended to replace advice given to you by your health care provider. Make sure you discuss any questions you have with your health care provider.
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
## 2013-12-08 ENCOUNTER — Telehealth: Payer: Self-pay | Admitting: *Deleted
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
## 2014-03-02 ENCOUNTER — Encounter (HOSPITAL_COMMUNITY): Payer: Self-pay | Admitting: Emergency Medicine

## 2014-05-27 ENCOUNTER — Emergency Department (HOSPITAL_COMMUNITY)
Admission: EM | Admit: 2014-05-27 | Discharge: 2014-05-27 | Disposition: A | Discharge status: Home / Self Care | Payer: Medicaid Other | Attending: Emergency Medicine | Admitting: Emergency Medicine

## 2014-05-27 ENCOUNTER — Encounter (HOSPITAL_COMMUNITY): Payer: Self-pay | Admitting: Cardiology

## 2014-05-27 DIAGNOSIS — K0889 Other specified disorders of teeth and supporting structures: Secondary | ICD-10-CM

## 2014-05-27 DIAGNOSIS — Z87891 Personal history of nicotine dependence: Secondary | ICD-10-CM | POA: Insufficient documentation

## 2014-05-27 DIAGNOSIS — Z79899 Other long term (current) drug therapy: Secondary | ICD-10-CM | POA: Insufficient documentation

## 2014-05-27 DIAGNOSIS — Z8619 Personal history of other infectious and parasitic diseases: Secondary | ICD-10-CM | POA: Insufficient documentation

## 2014-05-27 DIAGNOSIS — R599 Enlarged lymph nodes, unspecified: Secondary | ICD-10-CM | POA: Insufficient documentation

## 2014-05-27 DIAGNOSIS — Z8742 Personal history of other diseases of the female genital tract: Secondary | ICD-10-CM | POA: Insufficient documentation

## 2014-05-27 DIAGNOSIS — K0520 Aggressive periodontitis, unspecified: Secondary | ICD-10-CM | POA: Insufficient documentation

## 2014-05-27 DIAGNOSIS — K088 Other specified disorders of teeth and supporting structures: Secondary | ICD-10-CM | POA: Insufficient documentation

## 2014-05-27 MED ORDER — AMOXICILLIN 500 MG PO CAPS: 500.0000 mg | ORAL_CAPSULE | Freq: Three times a day (TID) | ORAL | Status: DC

## 2014-05-27 MED ORDER — TRAMADOL HCL 50 MG PO TABS: 50.0000 mg | ORAL_TABLET | Freq: Four times a day (QID) | ORAL | Status: DC | PRN

## 2014-05-27 NOTE — ED Notes (Signed)
Dental pain times 3 days.   

## 2014-05-27 NOTE — ED Provider Notes (Signed)
CSN: [id number]     Arrival date & time 05/27/14  1533 History   First MD Initiated Contact with Patient 05/27/14 1544     Chief Complaint  Patient presents with  . Dental Pain     (Consider location/radiation/quality/duration/timing/severity/associated sxs/prior Treatment) Patient is a 24 y.o. female presenting with tooth pain. The history is provided by the patient.  Dental Pain Location:  Upper and lower Upper teeth location:  15/LU 2nd molar Lower teeth location:  18/LL 2nd molar, 29/RL 2nd bicuspid and 32/RL 3rd molar Quality:  Throbbing Severity:  Severe Duration:  3 days Timing:  Constant Progression:  Worsening Chronicity:  New Relieved by:  Nothing Worsened by:  Cold food/drink Associated symptoms: facial pain and gum swelling     Past Medical History  Diagnosis Date  . Chlamydia   . BV (bacterial vaginosis)   . Nausea and vomiting in pregnancy prior to [redacted] weeks gestation 12/04/2012  . Vaginal discharge in pregnancy in first trimester 12/04/2012  . Pregnant 12/04/2012  . Medical history non-contributory   . Contraceptive management 08/25/2013   Past Surgical History  Procedure Laterality Date  . No past surgeries     Family History  Problem Relation Age of Onset  . Hypertension Mother    History  Substance Use Topics  . Smoking status: Former Smoker    Types: Cigarettes    Quit date: 07/16/2010  . Smokeless tobacco: Never Used  . Alcohol Use: No   OB History    Gravida Para Term Preterm AB TAB SAB Ectopic Multiple Living   Review of Systems Negative except as stated in HPI   Allergies  Review of patient's allergies indicates no known allergies.  Home Medications   Prior to Admission medications   Medication Sig Start Date End Date Taking? Authorizing Provider  amoxicillin (AMOXIL) 500 MG capsule Take 1 capsule (500 mg total) by mouth 3 (three) times daily. 05/27/14   Dwan Fennel Orlene Och, NP  norgestimate-ethinyl estradiol  (ORTHO-CYCLEN,SPRINTEC,PREVIFEM) 0.25-35 MG-MCG tablet Take 1 tablet by mouth daily. 08/25/13   Adline Potter, NP  traMADol (ULTRAM) 50 MG tablet Take 1 tablet (50 mg total) by mouth every 6 (six) hours as needed. 05/27/14   Rome Echavarria Orlene Och, NP   BP 122/74 mmHg  Pulse 96  Temp(Src) 98.2 F (36.8 C) (Oral)  Resp 18  Ht  (1.676 m)  Wt 120 lb (54.432 kg)  BMI 19.38 kg/m2  SpO2 100%  LMP 05/06/2014 Physical Exam  Constitutional: She is oriented to person, place, and time. She appears well-developed and well-nourished.  HENT:  Head: Normocephalic.  Mouth/Throat: Uvula is midline, oropharynx is clear and moist and mucous membranes are normal.    Eyes: Conjunctivae and EOM are normal.  Neck: Neck supple.  Cardiovascular: Normal rate and regular rhythm.   Pulmonary/Chest: Effort normal and breath sounds normal.  Abdominal: Soft. There is no tenderness.  Musculoskeletal: Normal range of motion.  Lymphadenopathy:    She has cervical adenopathy.  Neurological: She is alert and oriented to person, place, and time. No cranial nerve deficit.  Skin: Skin is warm and dry.  Psychiatric: She has a normal mood and affect. Her behavior is normal.  Nursing note and vitals reviewed.   ED Course  Procedures (including critical care time) Labs Review  MDM  24 y.o. female with dental pain. Will treat for infection and pain and she will follow up  with a dentist as soon as possible. She will return here as needed. Discussed with the patient and all questioned fully answered.   Final diagnoses:  Acute pericoronitis  Pain, dental       Janne NapoleonHope M Caylee Vlachos, NP 05/28/14 2127

## 2014-05-27 NOTE — Discharge Instructions (Signed)
Continue to take ibuprofen in addition to the medications we give you. Follow up with a dentist as soon as possible.  Dental Pain Toothache is pain in or around a tooth. It may get worse with chewing or with cold or heat.  HOME CARE  Your dentist may use a numbing medicine during treatment. If so, you may need to avoid eating until the medicine wears off. Ask your dentist about this.  Only take medicine as told by your dentist or doctor.  Avoid chewing food near the painful tooth until after all treatment is done. Ask your dentist about this. GET HELP RIGHT AWAY IF:   The problem gets worse or new problems appear.  You have a fever.  There is redness and puffiness (swelling) of the face, jaw, or neck.  You cannot open your mouth.  There is pain in the jaw.  There is very bad pain that is not helped by medicine. MAKE SURE YOU:   Understand these instructions.  Will watch your condition.  Will get help right away if you are not doing well or get worse. Document Released: 10/04/2007 Document Revised: 07/10/2011 Document Reviewed: 10/04/2007 [facility] Patient Information 2015 [place], [place]. This information is not intended to replace advice given to you by your health care provider. Make sure you discuss any questions you have with your health care provider.  Dental Care and Dentist Visits Dental care supports good overall health. Regular dental visits can also help you avoid dental pain, bleeding, infection, and other more serious health problems in the future. It is important to keep the mouth healthy because diseases in the teeth, gums, and other oral tissues can spread to other areas of the body. Some problems, such as diabetes, heart disease, and pre-term labor have been associated with poor oral health.  See your dentist every 6 months. If you experience emergency problems such as a toothache or broken tooth, go to the dentist right away. If you see your dentist regularly, you  may catch problems early. It is easier to be treated for problems in the early stages.  WHAT TO EXPECT AT A DENTIST VISIT  Your dentist will look for many common oral health problems and recommend proper treatment. At your regular dental visit, you can expect:  Gentle cleaning of the teeth and gums. This includes scraping and polishing. This helps to remove the sticky substance around the teeth and gums (plaque). Plaque forms in the mouth shortly after eating. Over time, plaque hardens on the teeth as tartar. If tartar is not removed regularly, it can cause problems. Cleaning also helps remove stains.  Periodic X-rays. These pictures of the teeth and supporting bone will help your dentist assess the health of your teeth.  Periodic fluoride treatments. Fluoride is a natural mineral shown to help strengthen teeth. Fluoride treatmentinvolves applying a fluoride gel or varnish to the teeth. It is most commonly done in children.  Examination of the mouth, tongue, jaws, teeth, and gums to look for any oral health problems, such as:  Cavities (dental caries). This is decay on the tooth caused by plaque, sugar, and acid in the mouth. It is best to catch a cavity when it is small.  Inflammation of the gums caused by plaque buildup (gingivitis).  Problems with the mouth or malformed or misaligned teeth.  Oral cancer or other diseases of the soft tissues or jaws. KEEP YOUR TEETH AND GUMS HEALTHY For healthy teeth and gums, follow these general guidelines as well as  your dentist's specific advice:  Have your teeth professionally cleaned at the dentist every 6 months.  Brush twice daily with a fluoride toothpaste.  Floss your teeth daily.  Ask your dentist if you need fluoride supplements, treatments, or fluoride toothpaste.  Eat a healthy diet. Reduce foods and drinks with added sugar.  Avoid smoking. TREATMENT FOR ORAL HEALTH PROBLEMS If you have oral health problems, treatment varies  depending on the conditions present in your teeth and gums.  Your caregiver will most likely recommend good oral hygiene at each visit.  For cavities, gingivitis, or other oral health disease, your caregiver will perform a procedure to treat the problem. This is typically done at a separate appointment. Sometimes your caregiver will refer you to another dental specialist for specific tooth problems or for surgery. SEEK IMMEDIATE DENTAL CARE IF:  You have pain, bleeding, or soreness in the gum, tooth, jaw, or mouth area.  A permanent tooth becomes loose or separated from the gum socket.  You experience a blow or injury to the mouth or jaw area. Document Released: 12/28/2010 Document Revised: 07/10/2011 Document Reviewed: 12/28/2010 [facility] Patient Information 2015 [place]. This information is not intended to replace advice given to you by your health care provider. Make sure you discuss any questions you have with your health care provider.

## 2014-07-18 ENCOUNTER — Emergency Department (HOSPITAL_COMMUNITY)
Admission: EM | Admit: 2014-07-18 | Discharge: 2014-07-18 | Disposition: A | Discharge status: Home / Self Care | Payer: Medicaid Other | Attending: Emergency Medicine | Admitting: Emergency Medicine

## 2014-07-18 ENCOUNTER — Encounter (HOSPITAL_COMMUNITY): Payer: Self-pay | Admitting: Emergency Medicine

## 2014-07-18 DIAGNOSIS — Z8619 Personal history of other infectious and parasitic diseases: Secondary | ICD-10-CM | POA: Insufficient documentation

## 2014-07-18 DIAGNOSIS — T192XXA Foreign body in vulva and vagina, initial encounter: Secondary | ICD-10-CM | POA: Insufficient documentation

## 2014-07-18 DIAGNOSIS — Z3202 Encounter for pregnancy test, result negative: Secondary | ICD-10-CM | POA: Insufficient documentation

## 2014-07-18 DIAGNOSIS — Y9289 Other specified places as the place of occurrence of the external cause: Secondary | ICD-10-CM | POA: Insufficient documentation

## 2014-07-18 DIAGNOSIS — Y998 Other external cause status: Secondary | ICD-10-CM | POA: Insufficient documentation

## 2014-07-18 DIAGNOSIS — Y9389 Activity, other specified: Secondary | ICD-10-CM | POA: Insufficient documentation

## 2014-07-18 DIAGNOSIS — Z87891 Personal history of nicotine dependence: Secondary | ICD-10-CM | POA: Insufficient documentation

## 2014-07-18 DIAGNOSIS — X58XXXA Exposure to other specified factors, initial encounter: Secondary | ICD-10-CM | POA: Insufficient documentation

## 2014-07-18 DIAGNOSIS — Z79899 Other long term (current) drug therapy: Secondary | ICD-10-CM | POA: Insufficient documentation

## 2014-07-18 DIAGNOSIS — Z792 Long term (current) use of antibiotics: Secondary | ICD-10-CM | POA: Insufficient documentation

## 2014-07-18 LAB — URINE MICROSCOPIC-ADD ON

## 2014-07-18 LAB — URINALYSIS, ROUTINE W REFLEX MICROSCOPIC
Bilirubin Urine: NEGATIVE
Glucose, UA: NEGATIVE mg/dL
Ketones, ur: NEGATIVE mg/dL
Nitrite: NEGATIVE
PH: 7 (ref 5.0–8.0)
Protein, ur: NEGATIVE mg/dL
Specific Gravity, Urine: 1.02 (ref 1.005–1.030)
Urobilinogen, UA: 1 mg/dL (ref 0.0–1.0)

## 2014-07-18 LAB — WET PREP, GENITAL
TRICH WET PREP: NONE SEEN
Yeast Wet Prep HPF POC: NONE SEEN

## 2014-07-18 LAB — PREGNANCY, URINE: Preg Test, Ur: NEGATIVE

## 2014-07-18 MED ORDER — AMOXICILLIN-POT CLAVULANATE 875-125 MG PO TABS
1.0000 | ORAL_TABLET | Freq: Once | ORAL | Status: AC
  Administered 2014-07-18: 1 via ORAL
  Filled 2014-07-18: qty 1

## 2014-07-18 MED ORDER — NAPROXEN 500 MG PO TABS: 500.0000 mg | ORAL_TABLET | Freq: Two times a day (BID) | ORAL | Status: DC

## 2014-07-18 MED ORDER — AMOXICILLIN-POT CLAVULANATE 875-125 MG PO TABS: 1.0000 | ORAL_TABLET | Freq: Two times a day (BID) | ORAL | Status: DC

## 2014-07-18 NOTE — ED Provider Notes (Signed)
CSN: [id number]     Arrival date & time 07/18/14  1922 History   First MD Initiated Contact with Patient 07/18/14 2120     Chief Complaint  Patient presents with  . Vaginal Bleeding     (Consider location/radiation/quality/duration/timing/severity/associated sxs/prior Treatment) HPI Comments: The patient is a 24 year old female, she has a history of dark vaginal discharge and lower abdominal pain. Last used a tampon approximately 11 days ago, symptoms are persistent, gradually worsening, no associated fevers or diarrhea.  Patient is a 24 y.o. female presenting with vaginal bleeding. The history is provided by the patient.  Vaginal Bleeding   Past Medical History  Diagnosis Date  . Chlamydia   . BV (bacterial vaginosis)   . Nausea and vomiting in pregnancy prior to [redacted] weeks gestation 12/04/2012  . Vaginal discharge in pregnancy in first trimester 12/04/2012  . Pregnant 12/04/2012  . Medical history non-contributory   . Contraceptive management 08/25/2013   Past Surgical History  Procedure Laterality Date  . No past surgeries     Family History  Problem Relation Age of Onset  . Hypertension Mother    History  Substance Use Topics  . Smoking status: Former Smoker    Types: Cigarettes    Quit date: 07/16/2010  . Smokeless tobacco: Never Used  . Alcohol Use: No   OB History    Gravida Para Term Preterm AB TAB SAB Ectopic Multiple Living   Review of Systems  Genitourinary: Positive for vaginal bleeding.  All other systems reviewed and are negative.     Allergies  Review of patient's allergies indicates no known allergies.  Home Medications   Prior to Admission medications   Medication Sig Start Date End Date Taking? Authorizing Provider  norgestimate-ethinyl estradiol (ORTHO-CYCLEN,SPRINTEC,PREVIFEM) 0.25-35 MG-MCG tablet Take 1 tablet by mouth daily. 08/25/13  Yes Adline Potter, NP  amoxicillin (AMOXIL) 500 MG capsule Take 1 capsule (500 mg  total) by mouth 3 (three) times daily. Patient not taking: Reported on 07/18/2014 05/27/14   Janne Napoleon, NP  amoxicillin-clavulanate (AUGMENTIN) 875-125 MG per tablet Take 1 tablet by mouth every 12 (twelve) hours. 07/18/14   Eber Hong, MD  naproxen (NAPROSYN) 500 MG tablet Take 1 tablet (500 mg total) by mouth 2 (two) times daily with a meal. 07/18/14   Eber Hong, MD  traMADol (ULTRAM) 50 MG tablet Take 1 tablet (50 mg total) by mouth every 6 (six) hours as needed. Patient not taking: Reported on 07/18/2014 05/27/14   Janne Napoleon, NP   BP 115/80 mmHg  Pulse 74  Temp(Src) 97.8 F (36.6 C) (Oral)  Resp 20  Ht 5' 5.5" (1.664 m)  Wt 118 lb (53.524 kg)  BMI 19.33 kg/m2  SpO2 99%  LMP 07/01/2014 Physical Exam  Constitutional: She appears well-developed and well-nourished. No distress.  HENT:  Head: Normocephalic and atraumatic.  Mouth/Throat: Oropharynx is clear and moist. No oropharyngeal exudate.  Eyes: Conjunctivae and EOM are normal. Pupils are equal, round, and reactive to light. Right eye exhibits no discharge. Left eye exhibits no discharge. No scleral icterus.  Neck: Normal range of motion. Neck supple. No JVD present. No thyromegaly present.  Cardiovascular: Normal rate, regular rhythm, normal heart sounds and intact distal pulses.  Exam reveals no gallop and no friction rub.   No murmur heard. Pulmonary/Chest: Effort normal and breath sounds normal. No respiratory distress. She has no wheezes. She has no rales.  Abdominal: Soft. Bowel sounds are normal. She exhibits no distension and no mass. There is no tenderness.  Genitourinary:  She had any bimanual tendernessSmall amount of vaginal discharge, large retained foreign body in the vaginal vault, tampon removed with ring forceps, see procedure note.  No CMT, no adnexal ttp  Chaperone present for vaginal exam  Musculoskeletal: Normal range of motion. She exhibits no edema or tenderness.  Lymphadenopathy:    She has no  cervical adenopathy.  Neurological: She is alert. Coordination normal.  Skin: Skin is warm and dry. No rash noted. No erythema.  Psychiatric: She has a normal mood and affect. Her behavior is normal.  Nursing note and vitals reviewed.   ED Course  FOREIGN BODY REMOVAL Date/Time: 07/18/2014 9:44 PM Performed by: Eber HongMILLER, Adalina Dopson Authorized by: Eber HongMILLER, Keelin Neville Consent: Verbal consent obtained. Risks and benefits: risks, benefits and alternatives were discussed Consent given by: patient Required items: required blood products, implants, devices, and special equipment available Patient identity confirmed: verbally with patient Time out: Immediately prior to procedure a "time out" was called to verify the correct patient, procedure, equipment, support staff and site/side marked as required. Body area: vagina Patient sedated: no Patient cooperative: yes Localization method: speculum Removal mechanism: ring forceps Complexity: simple 1 objects recovered. Objects recovered: tampon Post-procedure assessment: foreign body removed Patient tolerance: Patient tolerated the procedure well with no immediate complications   (including critical care time) Labs Review Labs Reviewed  WET PREP, GENITAL - Abnormal; Notable for the following:    Clue Cells Wet Prep HPF POC FEW (*)    WBC, Wet Prep HPF POC FEW (*)    All other components within normal limits  URINALYSIS, ROUTINE W REFLEX MICROSCOPIC - Abnormal; Notable for the following:    Hgb urine dipstick LARGE (*)    Leukocytes, UA TRACE (*)    All other components within normal limits  URINE MICROSCOPIC-ADD ON - Abnormal; Notable for the following:    Squamous Epithelial / LPF FEW (*)    Bacteria, UA FEW (*)    All other components within normal limits  PREGNANCY, URINE  GC/CHLAMYDIA PROBE AMP ([place])    Imaging Review No results found.    MDM   Final diagnoses:  Vaginal foreign body, initial encounter    The patient is  otherwise well-appearing, likely has mild toxic shock, vital signs totally normal, no fever or tachycardia or hypotension, G with Augmentin, GC chlamydia cultures obtained, patient informed of the results may be delayed, stable for discharge.  Meds given in ED:  Medications  amoxicillin-clavulanate (AUGMENTIN) 875-125 MG per tablet 1 tablet (1 tablet Oral Given 07/18/14 2217)    New Prescriptions   AMOXICILLIN-CLAVULANATE (AUGMENTIN) 875-125 MG PER TABLET    Take 1 tablet by mouth every 12 (twelve) hours.   NAPROXEN (NAPROSYN) 500 MG TABLET    Take 1 tablet (500 mg total) by mouth 2 (two) times daily with a meal.        Eber HongBrian Samrat Hayward, MD 07/18/14 2324

## 2014-07-18 NOTE — ED Notes (Signed)
Patient complaining of spotting/vaginal bleeding. States has had dark brown vaginal discharge that has an odor to it for about 4-5 days. Had menstrual period approximately 11 days ago. Also reports pelvic pain/lower abdominal pain.

## 2014-07-18 NOTE — ED Notes (Signed)
Pt alert & oriented x4, stable gait. Patient given discharge instructions, paperwork & prescription(s). Patient  instructed to stop at the registration desk to finish any additional paperwork. Patient verbalized understanding. Pt left department w/ no further questions. 

## 2014-07-18 NOTE — Discharge Instructions (Signed)
Please call your doctor for a followup appointment within 24-48 hours. When you talk to your doctor please let them know that you were seen in the emergency department and have them acquire all of your records so that they can discuss the findings with you and formulate a treatment plan to fully care for your new and ongoing problems. ° °

## 2014-07-20 LAB — GC/CHLAMYDIA PROBE AMP (~~LOC~~) NOT AT ARMC
CHLAMYDIA, DNA PROBE: POSITIVE — AB
Neisseria Gonorrhea: NEGATIVE

## 2014-07-21 ENCOUNTER — Telehealth (HOSPITAL_BASED_OUTPATIENT_CLINIC_OR_DEPARTMENT_OTHER): Payer: Self-pay | Admitting: Emergency Medicine

## 2014-07-21 NOTE — Telephone Encounter (Signed)
+   chlamydia, chart handoff to EDP for treatment plan 

## 2014-07-22 ENCOUNTER — Telehealth (HOSPITAL_BASED_OUTPATIENT_CLINIC_OR_DEPARTMENT_OTHER): Payer: Self-pay | Admitting: Emergency Medicine

## 2014-07-23 ENCOUNTER — Telehealth (HOSPITAL_BASED_OUTPATIENT_CLINIC_OR_DEPARTMENT_OTHER): Payer: Self-pay | Admitting: *Deleted

## 2014-08-07 ENCOUNTER — Other Ambulatory Visit: Payer: Self-pay | Admitting: Adult Health

## 2015-02-18 ENCOUNTER — Emergency Department (HOSPITAL_COMMUNITY)
Admission: EM | Admit: 2015-02-18 | Discharge: 2015-02-18 | Disposition: A | Discharge status: Home / Self Care | Payer: Medicaid Other | Attending: Emergency Medicine | Admitting: Emergency Medicine

## 2015-02-18 ENCOUNTER — Encounter (HOSPITAL_COMMUNITY): Payer: Self-pay

## 2015-02-18 DIAGNOSIS — Z3202 Encounter for pregnancy test, result negative: Secondary | ICD-10-CM | POA: Insufficient documentation

## 2015-02-18 DIAGNOSIS — Z87891 Personal history of nicotine dependence: Secondary | ICD-10-CM | POA: Insufficient documentation

## 2015-02-18 DIAGNOSIS — Z793 Long term (current) use of hormonal contraceptives: Secondary | ICD-10-CM | POA: Insufficient documentation

## 2015-02-18 DIAGNOSIS — Z8619 Personal history of other infectious and parasitic diseases: Secondary | ICD-10-CM | POA: Insufficient documentation

## 2015-02-18 DIAGNOSIS — Z202 Contact with and (suspected) exposure to infections with a predominantly sexual mode of transmission: Secondary | ICD-10-CM | POA: Insufficient documentation

## 2015-02-18 DIAGNOSIS — B9689 Other specified bacterial agents as the cause of diseases classified elsewhere: Secondary | ICD-10-CM

## 2015-02-18 DIAGNOSIS — N760 Acute vaginitis: Secondary | ICD-10-CM | POA: Insufficient documentation

## 2015-02-18 LAB — URINALYSIS, ROUTINE W REFLEX MICROSCOPIC
BILIRUBIN URINE: NEGATIVE
Glucose, UA: NEGATIVE mg/dL
KETONES UR: NEGATIVE mg/dL
NITRITE: NEGATIVE
PROTEIN: NEGATIVE mg/dL
SPECIFIC GRAVITY, URINE: 1.025 (ref 1.005–1.030)
UROBILINOGEN UA: 0.2 mg/dL (ref 0.0–1.0)
pH: 6 (ref 5.0–8.0)

## 2015-02-18 LAB — WET PREP, GENITAL
Trich, Wet Prep: NONE SEEN
Yeast Wet Prep HPF POC: NONE SEEN

## 2015-02-18 LAB — URINE MICROSCOPIC-ADD ON

## 2015-02-18 LAB — POC URINE PREG, ED: PREG TEST UR: NEGATIVE

## 2015-02-18 MED ORDER — CEFTRIAXONE SODIUM 250 MG IJ SOLR
250.0000 mg | Freq: Once | INTRAMUSCULAR | Status: AC
  Administered 2015-02-18: 250 mg via INTRAMUSCULAR
  Filled 2015-02-18: qty 250

## 2015-02-18 MED ORDER — AZITHROMYCIN 250 MG PO TABS
1000.0000 mg | ORAL_TABLET | Freq: Once | ORAL | Status: AC
  Administered 2015-02-18: 1000 mg via ORAL
  Filled 2015-02-18: qty 4

## 2015-02-18 MED ORDER — METRONIDAZOLE 500 MG PO TABS: 500.0000 mg | ORAL_TABLET | Freq: Two times a day (BID) | ORAL | Status: DC

## 2015-02-18 MED ORDER — LIDOCAINE HCL (PF) 1 % IJ SOLN
INTRAMUSCULAR | Status: AC
  Administered 2015-02-18: 5 mL
  Filled 2015-02-18: qty 5

## 2015-02-18 NOTE — ED Provider Notes (Signed)
CSN: [id number]     Arrival date & time 02/18/15  1946 History   First MD Initiated Contact with Patient 02/18/15 1958     Chief Complaint  Patient presents with  . SEXUALLY TRANSMITTED DISEASE     (Consider location/radiation/quality/duration/timing/severity/associated sxs/prior Treatment) Patient is a 24 y.o. female presenting with STD exposure. The history is provided by the patient. No language interpreter was used.  Exposure to STD This is a new problem. The current episode started in the past 7 days.   Jennifer [name] is a 24 y.o. female who presents to the ED with dysuria. She reports being exposed to Chlamydia about a week ago. She complains of vaginal d/c in addition to the burning. She denies any other problems. She has had Chlamydia in the past.   Past Medical History  Diagnosis Date  . Chlamydia   . BV (bacterial vaginosis)   . Nausea and vomiting in pregnancy prior to [redacted] weeks gestation 12/04/2012  . Vaginal discharge in pregnancy in first trimester 12/04/2012  . Pregnant 12/04/2012  . Medical history non-contributory   . Contraceptive management 08/25/2013   Past Surgical History  Procedure Laterality Date  . No past surgeries     Family History  Problem Relation Age of Onset  . Hypertension Mother    Social History  Substance Use Topics  . Smoking status: Former Smoker    Types: Cigarettes    Quit date: 07/16/2010  . Smokeless tobacco: Never Used  . Alcohol Use: No   OB History    Gravida Para Term Preterm AB TAB SAB Ectopic Multiple Living   Review of Systems Negative except as stated in HPI   Allergies  Review of patient's allergies indicates no known allergies.  Home Medications   Prior to Admission medications   Medication Sig Start Date End Date Taking? Authorizing Provider  Norgestimate-Ethinyl Estradiol Triphasic (ORTHO TRI-CYCLEN, 28,) 0.18/0.215/0.25 MG-35 MCG tablet Take 1 tablet by mouth daily.   Yes Historical Provider,  MD  metroNIDAZOLE (FLAGYL) 500 MG tablet Take 1 tablet (500 mg total) by mouth 2 (two) times daily. 02/18/15   Hope Orlene Och, NP   BP 109/70 mmHg  Pulse 83  Temp(Src) 97.9 F (36.6 C) (Oral)  Resp 14  Ht 5' 0.5" (1.537 m)  Wt 116 lb (52.617 kg)  BMI 22.27 kg/m2  SpO2 100%  LMP 02/15/2015 Physical Exam  Constitutional: She is oriented to person, place, and time. She appears well-developed and well-nourished.  HENT:  Head: Normocephalic.  Eyes: EOM are normal.  Neck: Neck supple.  Cardiovascular: Normal rate.   Pulmonary/Chest: Effort normal.  Abdominal: Soft. There is no tenderness.  Genitourinary:  External genitalia without lesions, blood tinged frothy d/c vaginal vault. Cervix inflamed, no CMT, no adnexal tenderness. Uterus without palpable enlargement.   Musculoskeletal: Normal range of motion.  Neurological: She is alert and oriented to person, place, and time. No cranial nerve deficit.  Skin: Skin is warm and dry.  Psychiatric: She has a normal mood and affect. Her behavior is normal.  Nursing note and vitals reviewed.   ED Course  Procedures (including critical care time) Labs Review Results for orders placed or performed during the hospital encounter of 02/18/15 (from the past 24 hour(s))  POC urine preg, ED (not at [facility])     Status: None   Collection Time: 02/18/15  8:14 PM  Result Value Ref Range  Preg Test, Ur NEGATIVE NEGATIVE  Wet prep, genital     Status: Abnormal   Collection Time: 02/18/15  8:25 PM  Result Value Ref Range   Yeast Wet Prep HPF POC NONE SEEN NONE SEEN   Trich, Wet Prep NONE SEEN NONE SEEN   Clue Cells Wet Prep HPF POC MODERATE (A) NONE SEEN   WBC, Wet Prep HPF POC MANY (A) NONE SEEN     MDM  24 y.o. female with exposure to Chlamydia last week here tonight for testing and treatment. Rocephin 250 mg IM, Zithromax 1 gram PO. Rx for Flagyl. Follow up with Dr. Rayna SextonFerguson's office. Discussed with the patient and all questioned fully answered.    Final diagnoses:  Exposure to STD  Bacterial vaginosis       Janne NapoleonHope M Neese, NP 02/18/15 2118  Samuel JesterKathleen McManus, DO 02/23/15 1232

## 2015-02-18 NOTE — ED Notes (Signed)
Patient states she was exposed to an STD one week ago. Patient states burning with urination and vaginal odor.

## 2015-02-20 LAB — HIV ANTIBODY (ROUTINE TESTING W REFLEX): HIV SCREEN 4TH GENERATION: NONREACTIVE

## 2015-02-20 LAB — RPR: RPR: NONREACTIVE

## 2015-02-22 LAB — GC/CHLAMYDIA PROBE AMP (~~LOC~~) NOT AT ARMC
Chlamydia: NEGATIVE
NEISSERIA GONORRHEA: POSITIVE — AB

## 2015-02-23 ENCOUNTER — Telehealth (HOSPITAL_COMMUNITY): Payer: Self-pay

## 2015-02-23 NOTE — Telephone Encounter (Signed)
Spoke with pt. Verified ID. Informed of labs. Treated per protocol. DHHS form faxed. Pt informed to abstain from sexual activity x 10 days and to notify partner for testing and treatment.  

## 2015-04-19 ENCOUNTER — Emergency Department (HOSPITAL_COMMUNITY)
Admission: EM | Admit: 2015-04-19 | Discharge: 2015-04-19 | Disposition: A | Discharge status: Home / Self Care | Payer: BLUE CROSS/BLUE SHIELD | Attending: Emergency Medicine | Admitting: Emergency Medicine

## 2015-04-19 ENCOUNTER — Encounter (HOSPITAL_COMMUNITY): Payer: Self-pay | Admitting: Emergency Medicine

## 2015-04-19 DIAGNOSIS — Y9289 Other specified places as the place of occurrence of the external cause: Secondary | ICD-10-CM | POA: Diagnosis not present

## 2015-04-19 DIAGNOSIS — Z8742 Personal history of other diseases of the female genital tract: Secondary | ICD-10-CM | POA: Insufficient documentation

## 2015-04-19 DIAGNOSIS — Z87891 Personal history of nicotine dependence: Secondary | ICD-10-CM | POA: Insufficient documentation

## 2015-04-19 DIAGNOSIS — Y998 Other external cause status: Secondary | ICD-10-CM | POA: Diagnosis not present

## 2015-04-19 DIAGNOSIS — M545 Low back pain: Secondary | ICD-10-CM | POA: Diagnosis present

## 2015-04-19 DIAGNOSIS — X58XXXA Exposure to other specified factors, initial encounter: Secondary | ICD-10-CM | POA: Diagnosis not present

## 2015-04-19 DIAGNOSIS — Z8619 Personal history of other infectious and parasitic diseases: Secondary | ICD-10-CM | POA: Diagnosis not present

## 2015-04-19 DIAGNOSIS — S39012A Strain of muscle, fascia and tendon of lower back, initial encounter: Secondary | ICD-10-CM | POA: Diagnosis not present

## 2015-04-19 DIAGNOSIS — Y9389 Activity, other specified: Secondary | ICD-10-CM | POA: Diagnosis not present

## 2015-04-19 DIAGNOSIS — Z3202 Encounter for pregnancy test, result negative: Secondary | ICD-10-CM | POA: Diagnosis not present

## 2015-04-19 LAB — URINALYSIS, ROUTINE W REFLEX MICROSCOPIC
BILIRUBIN URINE: NEGATIVE
Glucose, UA: NEGATIVE mg/dL
Ketones, ur: NEGATIVE mg/dL
NITRITE: NEGATIVE
Protein, ur: NEGATIVE mg/dL
SPECIFIC GRAVITY, URINE: 1.02 (ref 1.005–1.030)
pH: 6.5 (ref 5.0–8.0)

## 2015-04-19 LAB — URINE MICROSCOPIC-ADD ON

## 2015-04-19 LAB — POC URINE PREG, ED: PREG TEST UR: NEGATIVE

## 2015-04-19 MED ORDER — HYDROCODONE-ACETAMINOPHEN 5-325 MG PO TABS: ORAL_TABLET | ORAL | Status: DC

## 2015-04-19 MED ORDER — HYDROCODONE-ACETAMINOPHEN 5-325 MG PO TABS
1.0000 | ORAL_TABLET | Freq: Once | ORAL | Status: AC
  Administered 2015-04-19: 1 via ORAL
  Filled 2015-04-19: qty 1

## 2015-04-19 MED ORDER — METHOCARBAMOL 500 MG PO TABS: 500.0000 mg | ORAL_TABLET | Freq: Three times a day (TID) | ORAL | Status: DC

## 2015-04-19 MED ORDER — METHOCARBAMOL 500 MG PO TABS
500.0000 mg | ORAL_TABLET | Freq: Once | ORAL | Status: AC
  Administered 2015-04-19: 500 mg via ORAL
  Filled 2015-04-19: qty 1

## 2015-04-19 NOTE — ED Provider Notes (Signed)
CSN: [id number]     Arrival date & time 04/19/15  1525 History  By signing my name below, I, Jarvis Morganaylor Ferguson, attest that this documentation has been prepared under the direction and in the presence of Pauline Ausammy Clearence Vitug, PA-C Electronically Signed: Jarvis Morganaylor Ferguson, ED Scribe. 04/19/2015. 4:18 PM.    Chief Complaint  Patient presents with  . Back Pain     The history is provided by the patient. No language interpreter was used.    HPI Comments: Jennifer [name] is a 24 y.o. female who presents to the Emergency Department complaining constant, moderate, low back pain onset this morning; she notes this is a worsening of her chronic, mild low back pain that seems to flare up about 1x a month. She states the pain began this morning around 0400, 12 hours ago, when she was picking up a heavy package and began to have the pain. She notes she has had a kidney infection in the past with associated severe back pain. Pt reports the pain is worse with movement. She denies any alleviating factors. Pt took a Tylenol prior to arrival with no significant relief. She denies any prior trauma or injury to her leg. She is ambulatory without dificulty. Pt denies any numbness or weakness in her lower extremities, saddles anesthesia, urinary/bowel incontinence, urinary symptoms or other associated symptoms    Past Medical History  Diagnosis Date  . Chlamydia   . BV (bacterial vaginosis)   . Nausea and vomiting in pregnancy prior to [redacted] weeks gestation 12/04/2012  . Vaginal discharge in pregnancy in first trimester 12/04/2012  . Pregnant 12/04/2012  . Medical history non-contributory   . Contraceptive management 08/25/2013   Past Surgical History  Procedure Laterality Date  . No past surgeries     Family History  Problem Relation Age of Onset  . Hypertension Mother    Social History  Substance Use Topics  . Smoking status: Former Smoker    Types: Cigarettes    Quit date: 07/16/2010  . Smokeless tobacco: Never  Used  . Alcohol Use: No   OB History    Gravida Para Term Preterm AB TAB SAB Ectopic Multiple Living   2 2 2       2      Review of Systems  Musculoskeletal: Positive for back pain.  All other systems reviewed and are negative.     Allergies  Review of patient's allergies indicates no known allergies.  Home Medications   Prior to Admission medications   Medication Sig Start Date End Date Taking? Authorizing Provider  acetaminophen (TYLENOL) 500 MG tablet Take 500 mg by mouth every 6 (six) hours as needed for mild pain.   Yes Historical Provider, MD  metroNIDAZOLE (FLAGYL) 500 MG tablet Take 1 tablet (500 mg total) by mouth 2 (two) times daily. Patient not taking: Reported on 04/19/2015 02/18/15   Janne NapoleonHope M Neese, NP   Triage Vitals: BP 114/71 mmHg  Pulse 75  Temp(Src) 98 F (36.7 C) (Oral)  Resp 14  Ht 5\' 5"  (1.651 m)  Wt 120 lb (54.432 kg)  BMI 19.97 kg/m2  SpO2 100%  LMP 03/16/2015  Physical Exam  Constitutional: She is oriented to person, place, and time. She appears well-developed and well-nourished. No distress.  HENT:  Head: Normocephalic and atraumatic.  Eyes: Conjunctivae and EOM are normal.  Neck: Neck supple. No tracheal deviation present.  Cardiovascular: Normal rate, regular rhythm and intact distal pulses.   Pulmonary/Chest: Effort normal. No respiratory distress.  Musculoskeletal:  Normal range of motion.  TTP of the b/l lumbar paraspinal muscles. Left > right.  5/5 strength against resistance of bilateral LE's  Neurological: She is alert and oriented to person, place, and time.  Skin: Skin is warm and dry.  Psychiatric: She has a normal mood and affect. Her behavior is normal.  Nursing note and vitals reviewed.   ED Course  Procedures (including critical care time)  DIAGNOSTIC STUDIES: Oxygen Saturation is 100% on RA, normal by my interpretation.    COORDINATION OF CARE: 4:21 PM- Will order UA. Pt advised of plan for treatment and pt  agrees.   Labs Review Labs Reviewed  URINALYSIS, ROUTINE W REFLEX MICROSCOPIC (NOT AT [facility]) - Abnormal; Notable for the following:    Hgb urine dipstick SMALL (*)    Leukocytes, UA TRACE (*)    All other components within normal limits  URINE MICROSCOPIC-ADD ON - Abnormal; Notable for the following:    Squamous Epithelial / LPF 6-30 (*)    Bacteria, UA MANY (*)    All other components within normal limits  URINE CULTURE  POC URINE PREG, ED    Imaging Review No results found. I have personally reviewed and evaluated these images and lab results as part of my medical decision-making.  Urine culture pending  MDM   Final diagnoses:  Lumbar strain, initial encounter    Pt is well appearing, ambulates with a steady gait.  No focal neuro deficits.  No concerning sx's for emergent neurological process.  Likely musculoskeletal.  No reported trauma or indication for imaging at this time. Pt agrees to symptomatic tx and close PMD f/u.  Appears stable for d/c  I personally performed the services described in this documentation, which was scribed in my presence. The recorded information has been reviewed and is accurate.     Pauline Aus, PA-C 04/19/15 1716  Dione Booze, MD 04/20/15 [phone]

## 2015-04-19 NOTE — ED Notes (Signed)
PT stated she picking up a package at work this am around 0400 and starting having lower back pain. PT denies any urinary symptoms.

## 2015-04-19 NOTE — Discharge Instructions (Signed)

## 2015-04-21 LAB — URINE CULTURE

## 2015-05-06 ENCOUNTER — Emergency Department (HOSPITAL_COMMUNITY)
Admission: EM | Admit: 2015-05-06 | Discharge: 2015-05-06 | Disposition: A | Discharge status: Home / Self Care | Payer: BLUE CROSS/BLUE SHIELD | Attending: Emergency Medicine | Admitting: Emergency Medicine

## 2015-05-06 ENCOUNTER — Encounter (HOSPITAL_COMMUNITY): Payer: Self-pay | Admitting: *Deleted

## 2015-05-06 DIAGNOSIS — Z79899 Other long term (current) drug therapy: Secondary | ICD-10-CM | POA: Diagnosis not present

## 2015-05-06 DIAGNOSIS — Z3202 Encounter for pregnancy test, result negative: Secondary | ICD-10-CM | POA: Diagnosis not present

## 2015-05-06 DIAGNOSIS — Z8619 Personal history of other infectious and parasitic diseases: Secondary | ICD-10-CM | POA: Insufficient documentation

## 2015-05-06 DIAGNOSIS — Z87898 Personal history of other specified conditions: Secondary | ICD-10-CM | POA: Diagnosis not present

## 2015-05-06 DIAGNOSIS — Z87891 Personal history of nicotine dependence: Secondary | ICD-10-CM | POA: Insufficient documentation

## 2015-05-06 DIAGNOSIS — N760 Acute vaginitis: Secondary | ICD-10-CM | POA: Diagnosis not present

## 2015-05-06 DIAGNOSIS — B9689 Other specified bacterial agents as the cause of diseases classified elsewhere: Secondary | ICD-10-CM

## 2015-05-06 DIAGNOSIS — M545 Low back pain, unspecified: Secondary | ICD-10-CM

## 2015-05-06 LAB — URINALYSIS, ROUTINE W REFLEX MICROSCOPIC
Bilirubin Urine: NEGATIVE
Glucose, UA: NEGATIVE mg/dL
KETONES UR: NEGATIVE mg/dL
Nitrite: NEGATIVE
PROTEIN: NEGATIVE mg/dL
Specific Gravity, Urine: 1.02 (ref 1.005–1.030)
pH: 6.5 (ref 5.0–8.0)

## 2015-05-06 LAB — URINE MICROSCOPIC-ADD ON

## 2015-05-06 LAB — WET PREP, GENITAL
Sperm: NONE SEEN
Trich, Wet Prep: NONE SEEN
Yeast Wet Prep HPF POC: NONE SEEN

## 2015-05-06 LAB — PREGNANCY, URINE: Preg Test, Ur: NEGATIVE

## 2015-05-06 MED ORDER — CYCLOBENZAPRINE HCL 10 MG PO TABS
10.0000 mg | ORAL_TABLET | Freq: Once | ORAL | Status: AC
  Administered 2015-05-06: 10 mg via ORAL
  Filled 2015-05-06: qty 1

## 2015-05-06 MED ORDER — NAPROXEN 250 MG PO TABS
500.0000 mg | ORAL_TABLET | Freq: Once | ORAL | Status: AC
Start: 2015-05-06 — End: 2015-05-06
  Administered 2015-05-06: 500 mg via ORAL
  Filled 2015-05-06: qty 2

## 2015-05-06 MED ORDER — METRONIDAZOLE 500 MG PO TABS: 500.0000 mg | ORAL_TABLET | Freq: Two times a day (BID) | ORAL | Status: DC

## 2015-05-06 MED ORDER — CYCLOBENZAPRINE HCL 5 MG PO TABS: 5.0000 mg | ORAL_TABLET | Freq: Three times a day (TID) | ORAL | Status: DC | PRN

## 2015-05-06 MED ORDER — NAPROXEN 500 MG PO TABS
ORAL_TABLET | ORAL | Status: DC
Start: 2015-05-06 — End: 2015-09-10

## 2015-05-06 NOTE — ED Provider Notes (Signed)
CSN: [id number]     Arrival date & time 05/06/15  0405 History   First MD Initiated Contact with Patient 05/06/15 [id number]     Chief Complaint  Patient presents with  . Back Pain    back pain for 3 weeks. no known  injury noted      (Consider location/radiation/quality/duration/timing/severity/associated sxs/prior Treatment) HPI patient is G2 P2 Ab0, reports her last normal period started around November 25. She states she was taking birth control pills however she stopped taking them about 6 weeks ago. She states she is not trying to get pregnant. She states she was bending down at work on December 19 and when she stood up she had acute lower back pain. She was seen in the ED and treated. She states she continues to have pain in her lower back and indicates the paraspinous muscles of the lumbar spine as to the area that is painful and she does not have pain over the lumbar spine itself. She states movement makes the pain worse, she states laying flat makes the pain feel better. She also reports for the past 2 weeks her urine has been cloudy without dysuria or urgency or hematuria. She states however she is having frequency. She also describes lower abdominal pain and vaginal discharge for the past 2-3 weeks also. She denies any fever.  PCP Dr Emelda Fear  Past Medical History  Diagnosis Date  . Chlamydia   . BV (bacterial vaginosis)   . Nausea and vomiting in pregnancy prior to [redacted] weeks gestation 12/04/2012  . Vaginal discharge in pregnancy in first trimester 12/04/2012  . Pregnant 12/04/2012  . Medical history non-contributory   . Contraceptive management 08/25/2013   Past Surgical History  Procedure Laterality Date  . No past surgeries     Family History  Problem Relation Age of Onset  . Hypertension Mother    Social History  Substance Use Topics  . Smoking status: Former Smoker    Types: Cigarettes    Quit date: 07/16/2010  . Smokeless tobacco: Never Used  . Alcohol Use: No    employed  OB History    Gravida Para Term Preterm AB TAB SAB Ectopic Multiple Living   2 2 2       2      Review of Systems  All other systems reviewed and are negative.     Allergies  Review of patient's allergies indicates no known allergies.  Home Medications   Prior to Admission medications   Medication Sig Start Date End Date Taking? Authorizing Provider  acetaminophen (TYLENOL) 500 MG tablet Take 500 mg by mouth every 6 (six) hours as needed for mild pain.    Historical Provider, MD  cyclobenzaprine (FLEXERIL) 5 MG tablet Take 1 tablet (5 mg total) by mouth 3 (three) times daily as needed. 05/06/15   Devoria Albe, MD  HYDROcodone-acetaminophen (NORCO/VICODIN) 5-325 MG tablet Take one-two tabs po q 4-6 hrs prn pain 04/19/15   Tammy Triplett, PA-C  methocarbamol (ROBAXIN) 500 MG tablet Take 1 tablet (500 mg total) by mouth 3 (three) times daily. 04/19/15   Tammy Triplett, PA-C  metroNIDAZOLE (FLAGYL) 500 MG tablet Take 1 tablet (500 mg total) by mouth 2 (two) times daily. 05/06/15   Devoria Albe, MD  naproxen (NAPROSYN) 500 MG tablet Take 1 po BID with food prn pain 05/06/15   Devoria Albe, MD   BP 132/59 mmHg  Pulse 72  Temp(Src) 97.8 F (36.6 C) (Oral)  Resp 15  Ht 5' 5.5" (  1.664 m)  Wt 123 lb (55.792 kg)  BMI 20.15 kg/m2  SpO2 100%  LMP 04/01/2015  Vital signs normal   Physical Exam  Constitutional: She is oriented to person, place, and time. She appears well-developed and well-nourished.  Non-toxic appearance. She does not appear ill. No distress.  HENT:  Head: Normocephalic and atraumatic.  Right Ear: External ear normal.  Left Ear: External ear normal.  Nose: Nose normal. No mucosal edema or rhinorrhea.  Mouth/Throat: Oropharynx is clear and moist and mucous membranes are normal. No dental abscesses or uvula swelling.  Eyes: Conjunctivae and EOM are normal. Pupils are equal, round, and reactive to light.  Neck: Normal range of motion and full passive range of motion  without pain. Neck supple.  Cardiovascular: Normal rate, regular rhythm and normal heart sounds.  Exam reveals no gallop and no friction rub.   No murmur heard. Pulmonary/Chest: Effort normal and breath sounds normal. No respiratory distress. She has no wheezes. She has no rhonchi. She has no rales. She exhibits no tenderness and no crepitus.  Abdominal: Soft. Normal appearance and bowel sounds are normal. She exhibits no distension. There is no tenderness. There is no rebound and no guarding.  Patient has mild diffuse lower pelvic pain to palpation.  Genitourinary:  Normal external genitalia, moderate or Wo amount of white discharge in the vault, uterus is normal sized and nontender, adnexa are normal size without masses and are nontender.  Musculoskeletal: Normal range of motion. She exhibits no edema or tenderness.       Back:  Moves all extremities well. Patient has pain along the paraspinous muscles of the bilateral lumbar spine. When she does range of motion of her waste it is painful in all directions in the same area.  Neurological: She is alert and oriented to person, place, and time. She has normal strength. No cranial nerve deficit.  Skin: Skin is warm, dry and intact. No rash noted. No erythema. No pallor.  Psychiatric: She has a normal mood and affect. Her speech is normal and behavior is normal. Her mood appears not anxious.  Nursing note and vitals reviewed.   ED Course  Procedures (including critical care time) Medications  naproxen (NAPROSYN) tablet 500 mg (500 mg Oral Given 05/06/15 0526)  cyclobenzaprine (FLEXERIL) tablet 10 mg (10 mg Oral Given 05/06/15 0526)      05:15 discussed her test results with contaminated urinalysis, will need to get in and out cath.   In and out cath attempted by nursing staff however there was no urine in her bladder and she refused to let them try again.  Patient noted to have BV on her wet prep. She is treated with Flagyl. She was given  Naprosyn and Flexeril for her low back pain. She can be reevaluated by the orthopedist on-call if she continues to have back problems. She was advised to use ice and heat on her back for discomfort.   Labs Review Results for orders placed or performed during the hospital encounter of 05/06/15  Wet prep, genital  Result Value Ref Range   Yeast Wet Prep HPF POC NONE SEEN NONE SEEN   Trich, Wet Prep NONE SEEN NONE SEEN   Clue Cells Wet Prep HPF POC PRESENT (A) NONE SEEN   WBC, Wet Prep HPF POC MANY (A) NONE SEEN   Sperm NONE SEEN   Urinalysis, Routine w reflex microscopic  Result Value Ref Range   Color, Urine YELLOW YELLOW   APPearance CLEAR CLEAR  Specific Gravity, Urine 1.020 1.005 - 1.030   pH 6.5 5.0 - 8.0   Glucose, UA NEGATIVE NEGATIVE mg/dL   Hgb urine dipstick LARGE (A) NEGATIVE   Bilirubin Urine NEGATIVE NEGATIVE   Ketones, ur NEGATIVE NEGATIVE mg/dL   Protein, ur NEGATIVE NEGATIVE mg/dL   Nitrite NEGATIVE NEGATIVE   Leukocytes, UA MODERATE (A) NEGATIVE  Pregnancy, urine  Result Value Ref Range   Preg Test, Ur NEGATIVE NEGATIVE  Urine microscopic-add on  Result Value Ref Range   Squamous Epithelial / LPF TOO NUMEROUS TO COUNT (A) NONE SEEN   WBC, UA TOO NUMEROUS TO COUNT 0 - 5 WBC/hpf   RBC / HPF 6-30 0 - 5 RBC/hpf   Bacteria, UA MANY (A) NONE SEEN   Urine-Other MUCOUS PRESENT    Laboratory interpretation all normal except contaminated urinalysis, in an out cath ordered, Bacterial vaginosis     MDM   Final diagnoses:  Bilateral low back pain without sciatica  BV (bacterial vaginosis)   New Prescriptions   CYCLOBENZAPRINE (FLEXERIL) 5 MG TABLET    Take 1 tablet (5 mg total) by mouth 3 (three) times daily as needed.   METRONIDAZOLE (FLAGYL) 500 MG TABLET    Take 1 tablet (500 mg total) by mouth 2 (two) times daily.   NAPROXEN (NAPROSYN) 500 MG TABLET    Take 1 po BID with food prn pain    Plan discharge  Devoria AlbeIva Sherria Riemann, MD, [place] PyoFACEP      Zamirah Denny,  MD 05/06/15 [phone]

## 2015-05-06 NOTE — ED Notes (Signed)
In and out was preformed. No urine return. Pt refused to go any further for urine attempt or another in and out.

## 2015-05-06 NOTE — Discharge Instructions (Signed)
Use ice and heat on your back. Take the medication as prescribed.You will be called if your STD tests are positive.  If you continue to have back pain, call Dr Ruthe Mannan office to get an appointment. He is an orthopedist. Recheck if you get a fever, vomiting.    Back Injury Prevention Back injuries can be very painful. They can also be difficult to heal. After having one back injury, you are more likely to injure your back again. It is important to learn how to avoid injuring or re-injuring your back. The following tips can help you to prevent a back injury. WHAT SHOULD I KNOW ABOUT PHYSICAL FITNESS?  Exercise for 30 minutes per day on most days of the week or as told by your doctor. Make sure to:  Do aerobic exercises, such as walking, jogging, biking, or swimming.  Do exercises that increase balance and strength, such as tai chi and yoga.  Do stretching exercises. This helps with flexibility.  Try to develop strong belly (abdominal) muscles. Your belly muscles help to support your back.  Stay at a healthy weight. This helps to decrease your risk of a back injury. WHAT SHOULD I KNOW ABOUT MY DIET?  Talk with your doctor about your overall diet. Take supplements and vitamins only as told by your doctor.  Talk with your doctor about how much calcium and vitamin D you need each day. These nutrients help to prevent weakening of the bones (osteoporosis).  Include good sources of calcium in your diet, such as:  Dairy products.  Green leafy vegetables.  Products that have had calcium added to them (fortified).  Include good sources of vitamin D in your diet, such as:  Milk.  Foods that have had vitamin D added to them. WHAT SHOULD I KNOW ABOUT MY POSTURE?  Sit up straight and stand up straight. Avoid leaning forward when you sit or hunching over when you stand.  Choose chairs that have good low-back (lumbar) support.  If you work at a desk, sit close to it so you do not need to  lean over. Keep your chin tucked in. Keep your neck drawn back. Keep your elbows bent so your arms look like the letter "L" (right angle).  Sit high and close to the steering wheel when you drive. Add a low-back support to your car seat, if needed.  Avoid sitting or standing in one position for very long. Take breaks to get up, stretch, and walk around at least one time every hour. Take breaks every hour if you are driving for long periods of time.  Sleep on your side with your knees slightly bent, or sleep on your back with a pillow under your knees. Do not lie on the front of your body to sleep. WHAT SHOULD I KNOW ABOUT LIFTING, TWISTING, AND REACHING Lifting and Heavy Lifting  Avoid heavy lifting, especially lifting over and over again. If you must do heavy lifting:  Stretch before lifting.  Work slowly.  Rest between lifts.  Use a tool such as a cart or a dolly to move objects if one is available.  Make several small trips instead of carrying one heavy load.  Ask for help when you need it, especially when moving big objects.  Follow these steps when lifting:  Stand with your feet shoulder-width apart.  Get as close to the object as you can. Do not pick up a heavy object that is far from your body.  Use handles or lifting straps  if they are available.  Bend at your knees. Squat down, but keep your heels off the floor.  Keep your shoulders back. Keep your chin tucked in. Keep your back straight.  Lift the object slowly while you tighten the muscles in your legs, belly, and butt. Keep the object as close to the center of your body as possible.  Follow these steps when putting down a heavy load:  Stand with your feet shoulder-width apart.  Lower the object slowly while you tighten the muscles in your legs, belly, and butt. Keep the object as close to the center of your body as possible.  Keep your shoulders back. Keep your chin tucked in. Keep your back straight.  Bend  at your knees. Squat down, but keep your heels off the floor.  Use handles or lifting straps if they are available. Twisting and Reaching 1. Avoid lifting heavy objects above your waist. 2. Do not twist at your waist while you are lifting or carrying a load. If you need to turn, move your feet. 3. Do not bend over without bending at your knees. 4. Avoid reaching over your head, across a table, or for an object on a high surface.  WHAT ARE SOME OTHER TIPS? 1. Avoid wet floors and icy ground. Keep sidewalks clear of ice to prevent falls.  2. Do not sleep on a mattress that is too soft or too hard.  3. Keep items that you use often within easy reach.  4. Put heavier objects on shelves at waist level, and put lighter objects on lower or higher shelves. 5. Find ways to lower your stress, such as: 1. Exercise. 2. Massage. 3. Relaxation techniques. 6. Talk with your doctor if you feel anxious or depressed. These conditions can make back pain worse. 7. Wear flat heel shoes with cushioned soles. 8. Avoid making quick (sudden) movements. 9. Use both shoulder straps when carrying a backpack. 10. Do not use any tobacco products, including cigarettes, chewing tobacco, or electronic cigarettes. If you need help quitting, ask your doctor.   This information is not intended to replace advice given to you by your health care provider. Make sure you discuss any questions you have with your health care provider.   Document Released: 10/04/2007 Document Revised: 09/01/2014 Document Reviewed: 04/21/2014 Elsevier Interactive Patient Education 2016 [place].  Back Exercises If you have pain in your back, do these exercises 2-3 times each day or as told by your doctor. When the pain goes away, do the exercises once each day, but repeat the steps more times for each exercise (do more repetitions). If you do not have pain in your back, do these exercises once each day or as told by your  doctor. EXERCISES Single Knee to Chest Do these steps 3-5 times in a row for each leg:  Lie on your back on a firm bed or the floor with your legs stretched out.  Bring one knee to your chest.  Hold your knee to your chest by grabbing your knee or thigh.  Pull on your knee until you feel a gentle stretch in your lower back.  Keep doing the stretch for 10-30 seconds.  Slowly let go of your leg and straighten it. Pelvic Tilt Do these steps 5-10 times in a row:  Lie on your back on a firm bed or the floor with your legs stretched out.  Bend your knees so they point up to the ceiling. Your feet should be flat on the  floor.  Tighten your lower belly (abdomen) muscles to press your lower back against the floor. This will make your tailbone point up to the ceiling instead of pointing down to your feet or the floor.  Stay in this position for 5-10 seconds while you gently tighten your muscles and breathe evenly. Cat-Cow Do these steps until your lower back bends more easily:  Get on your hands and knees on a firm surface. Keep your hands under your shoulders, and keep your knees under your hips. You may put padding under your knees.  Let your head hang down, and make your tailbone point down to the floor so your lower back is round like the back of a cat.  Stay in this position for 5 seconds.  Slowly lift your head and make your tailbone point up to the ceiling so your back hangs low (sags) like the back of a cow.  Stay in this position for 5 seconds. Press-Ups Do these steps 5-10 times in a row:  Lie on your belly (face-down) on the floor.  Place your hands near your head, about shoulder-width apart.  While you keep your back relaxed and keep your hips on the floor, slowly straighten your arms to raise the top half of your body and lift your shoulders. Do not use your back muscles. To make yourself more comfortable, you may change where you place your hands.  Stay in this  position for 5 seconds.  Slowly return to lying flat on the floor. Bridges Do these steps 10 times in a row: 5. Lie on your back on a firm surface. 6. Bend your knees so they point up to the ceiling. Your feet should be flat on the floor. 7. Tighten your butt muscles and lift your butt off of the floor until your waist is almost as high as your knees. If you do not feel the muscles working in your butt and the back of your thighs, slide your feet 1-2 inches farther away from your butt. 8. Stay in this position for 3-5 seconds. 9. Slowly lower your butt to the floor, and let your butt muscles relax. If this exercise is too easy, try doing it with your arms crossed over your chest. Belly Crunches Do these steps 5-10 times in a row: 11. Lie on your back on a firm bed or the floor with your legs stretched out. 12. Bend your knees so they point up to the ceiling. Your feet should be flat on the floor. 29. Cross your arms over your chest. 14. Tip your chin a little bit toward your chest but do not bend your neck. 77. Tighten your belly muscles and slowly raise your chest just enough to lift your shoulder blades a tiny bit off of the floor. 16. Slowly lower your chest and your head to the floor. Back Lifts Do these steps 5-10 times in a row: 1. Lie on your belly (face-down) with your arms at your sides, and rest your forehead on the floor. 2. Tighten the muscles in your legs and your butt. 3. Slowly lift your chest off of the floor while you keep your hips on the floor. Keep the back of your head in line with the curve in your back. Look at the floor while you do this. 4. Stay in this position for 3-5 seconds. 5. Slowly lower your chest and your face to the floor. GET HELP IF:  Your back pain gets a lot worse when you do an  exercise.  Your back pain does not lessen 2 hours after you exercise. If you have any of these problems, stop doing the exercises. Do not do them again unless your doctor  says it is okay. GET HELP RIGHT AWAY IF:  You have sudden, very bad back pain. If this happens, stop doing the exercises. Do not do them again unless your doctor says it is okay.   This information is not intended to replace advice given to you by your health care provider. Make sure you discuss any questions you have with your health care provider.   Document Released: 05/20/2010 Document Revised: 01/06/2015 Document Reviewed: 06/11/2014 Elsevier Interactive Patient Education 2016 [employer]  Bacterial Vaginosis Bacterial vaginosis is an infection of the vagina. It happens when too many germs (bacteria) grow in the vagina. Having this infection puts you at risk for getting other infections from sex. Treating this infection can help lower your risk for other infections, such as:   Chlamydia.  Gonorrhea.  HIV.  Herpes. HOME CARE  Take your medicine as told by your doctor.  Finish your medicine even if you start to feel better.  Tell your sex partner that you have an infection. They should see their doctor for treatment.  During treatment:  Avoid sex or use condoms correctly.  Do not douche.  Do not drink alcohol unless your doctor tells you it is ok.  Do not breastfeed unless your doctor tells you it is ok. GET HELP IF:  You are not getting better after 3 days of treatment.  You have more grey fluid (discharge) coming from your vagina than before.  You have more pain than before.  You have a fever. MAKE SURE YOU:   Understand these instructions.  Will watch your condition.  Will get help right away if you are not doing well or get worse.   This information is not intended to replace advice given to you by your health care provider. Make sure you discuss any questions you have with your health care provider.   Document Released: 01/25/2008 Document Revised: 05/08/2014 Document Reviewed: 11/27/2012 Elsevier Interactive Patient Education 2016 [place] therapy can help ease sore, stiff, injured, and tight muscles and joints. Heat relaxes your muscles, which may help ease your pain. Heat therapy should only be used on old, pre-existing, or long-lasting (chronic) injuries. Do not use heat therapy unless told by your doctor. HOW TO USE HEAT THERAPY There are several different kinds of heat therapy, including:  Moist heat pack.  Warm water bath.  Hot water bottle.  Electric heating pad.  Heated gel pack.  Heated wrap.  Electric heating pad. GENERAL HEAT THERAPY RECOMMENDATIONS   Do not sleep while using heat therapy. Only use heat therapy while you are awake.  Your skin may turn pink while using heat therapy. Do not use heat therapy if your skin turns red.  Do not use heat therapy if you have new pain.  High heat or long exposure to heat can cause burns. Be careful when using heat therapy to avoid burning your skin.  Do not use heat therapy on areas of your skin that are already irritated, such as with a rash or sunburn. GET HELP IF:   You have blisters, redness, swelling (puffiness), or numbness.  You have new pain.  Your pain is worse. MAKE SURE YOU:  Understand these instructions.  Will watch your condition.  Will get help right away if you are  not doing well or get worse.   This information is not intended to replace advice given to you by your health care provider. Make sure you discuss any questions you have with your health care provider.   Document Released: 07/10/2011 Document Revised: 05/08/2014 Document Reviewed: 06/10/2013 Elsevier Interactive Patient Education [employer].

## 2015-05-07 LAB — HIV ANTIBODY (ROUTINE TESTING W REFLEX): HIV SCREEN 4TH GENERATION: NONREACTIVE

## 2015-05-07 LAB — GC/CHLAMYDIA PROBE AMP (~~LOC~~) NOT AT ARMC
CHLAMYDIA, DNA PROBE: POSITIVE — AB
NEISSERIA GONORRHEA: NEGATIVE

## 2015-05-07 LAB — RPR: RPR Ser Ql: NONREACTIVE

## 2015-05-11 ENCOUNTER — Telehealth (HOSPITAL_COMMUNITY): Payer: Self-pay

## 2015-05-11 NOTE — Telephone Encounter (Signed)
Results received from [facility].  (+) Chlamydia   No antibiotic treatment or prescription given for STD.  Chart to MD office for review.  [facility] form attached. 

## 2015-05-18 ENCOUNTER — Telehealth (HOSPITAL_BASED_OUTPATIENT_CLINIC_OR_DEPARTMENT_OTHER): Payer: Self-pay | Admitting: Emergency Medicine

## 2015-05-18 NOTE — Telephone Encounter (Signed)
Post ED Visit - Positive Culture Follow-up: Successful Patient Follow-Up  Culture assessed and recommendations reviewed by:  Enzo Bi, Pharm.D.  Celedonio Miyamoto, Pharm.D., BCPS  Garvin Fila, Pharm.D.  Georgina Pillion, Pharm.D., BCPS  [place], [address].D., BCPS, AAHIVP  Estella Husk, Pharm.D., BCPS, AAHIVP  Tennis Must, Pharm.D.  Sherle Poe, [place].D.  Positive Chlamydia culture   Patient discharged without antimicrobial prescription and treatment is now indicated  Organism is resistant to prescribed ED discharge antimicrobial  Patient with positive blood cultures  Changes discussed with ED provider: Sharilyn Sites PA New antibiotic prescription  Azithromycin  po x 1 dose Called to [facility] [phone]  05/18/15 1635   Berle Mull 05/18/2015, 4:35 PM

## 2015-07-24 ENCOUNTER — Emergency Department (HOSPITAL_COMMUNITY)
Admission: EM | Admit: 2015-07-24 | Discharge: 2015-07-24 | Disposition: A | Discharge status: Home / Self Care | Payer: BLUE CROSS/BLUE SHIELD | Attending: Emergency Medicine | Admitting: Emergency Medicine

## 2015-07-24 ENCOUNTER — Encounter (HOSPITAL_COMMUNITY): Payer: Self-pay | Admitting: *Deleted

## 2015-07-24 DIAGNOSIS — H109 Unspecified conjunctivitis: Secondary | ICD-10-CM | POA: Insufficient documentation

## 2015-07-24 DIAGNOSIS — Z87891 Personal history of nicotine dependence: Secondary | ICD-10-CM | POA: Insufficient documentation

## 2015-07-24 MED ORDER — TOBRAMYCIN 0.3 % OP SOLN
1.0000 [drp] | Freq: Once | OPHTHALMIC | Status: AC
  Administered 2015-07-24: 1 [drp] via OPHTHALMIC
  Filled 2015-07-24: qty 5

## 2015-07-24 NOTE — Discharge Instructions (Signed)
Bacterial Conjunctivitis Bacterial conjunctivitis (commonly called pink eye) is redness, soreness, or puffiness (inflammation) of the white part of your eye. It is caused by a germ called bacteria. These germs can easily spread from person to person (contagious). Your eye often will become red or pink. Your eye may also become irritated, watery, or have a thick discharge.  HOME CARE   Apply a cool, clean washcloth over closed eyelids. Do this for 10-20 minutes, 3-4 times a day while you have pain.  Gently wipe away any fluid coming from the eye with a warm, wet washcloth or cotton ball.  Wash your hands often with soap and water. Use paper towels to dry your hands.  Do not share towels or washcloths.  Change or wash your pillowcase every day.  Do not use eye makeup until the infection is gone.  Do not use machines or drive if your vision is blurry.  Stop using contact lenses. Do not use them again until your doctor says it is okay.  Do not touch the tip of the eye drop bottle or medicine tube with your fingers when you put medicine on the eye. GET HELP RIGHT AWAY IF:   Your eye is not better after 3 days of starting your medicine.  You have a yellowish fluid coming out of the eye.  You have more pain in the eye.  Your eye redness is spreading.  Your vision becomes blurry.  You have a fever or lasting symptoms for more than 2-3 days.  You have a fever and your symptoms suddenly get worse.  You have pain in the face.  Your face gets red or puffy (swollen). MAKE SURE YOU:   Understand these instructions.  Will watch this condition.  Will get help right away if you are not doing well or get worse.   This information is not intended to replace advice given to you by your health care provider. Make sure you discuss any questions you have with your health care provider.   Document Released: 01/25/2008 Document Revised: 04/03/2012 Document Reviewed: 12/22/2011 Elsevier  Interactive Patient Education 2016 Elsevier Inc.  

## 2015-07-24 NOTE — ED Notes (Signed)
Pt c/o eye redness/irritation and itching x 3 days ago. Discharge from both eyes.

## 2015-07-28 NOTE — ED Provider Notes (Signed)
CSN: [id number]     Arrival date & time 07/24/15  1816 History   First MD Initiated Contact with Patient 07/24/15 1927     Chief Complaint  Patient presents with  . Conjunctivitis     (Consider location/radiation/quality/duration/timing/severity/associated sxs/prior Treatment) HPI   Jennifer [name] is a 25 y.o. female who presents to the Emergency Department complaining of redness, itching and discharge from both eyes for 3 days.  She states symptoms began in the right eye first.  She reports crusted and matting of here eyes in the mother.  She has been taking tylenol and using a warm compress to her eyes.  She denies recent illness, fever, facial pain or swelling or visual changes   Past Medical History  Diagnosis Date  . Chlamydia   . BV (bacterial vaginosis)   . Nausea and vomiting in pregnancy prior to [redacted] weeks gestation 12/04/2012  . Vaginal discharge in pregnancy in first trimester 12/04/2012  . Pregnant 12/04/2012  . Medical history non-contributory   . Contraceptive management 08/25/2013   Past Surgical History  Procedure Laterality Date  . No past surgeries     Family History  Problem Relation Age of Onset  . Hypertension Mother    Social History  Substance Use Topics  . Smoking status: Former Smoker    Types: Cigarettes    Quit date: 07/16/2010  . Smokeless tobacco: Never Used  . Alcohol Use: No   OB History    Gravida Para Term Preterm AB TAB SAB Ectopic Multiple Living   2 2 2       2      Review of Systems  Constitutional: Negative for fever.  HENT: Negative for congestion, ear pain, sore throat and trouble swallowing.   Eyes: Positive for discharge, redness and itching. Negative for photophobia and visual disturbance.  Respiratory: Negative for cough and chest tightness.   Musculoskeletal: Negative for neck pain.  Skin: Negative for rash.  Neurological: Negative for dizziness, facial asymmetry and headaches.  All other systems reviewed and are  negative.     Allergies  Review of patient's allergies indicates no known allergies.  Home Medications   Prior to Admission medications   Medication Sig Start Date End Date Taking? Authorizing Provider  acetaminophen (TYLENOL) 500 MG tablet Take 500 mg by mouth every 6 (six) hours as needed for mild pain.    Historical Provider, MD  cyclobenzaprine (FLEXERIL) 5 MG tablet Take 1 tablet (5 mg total) by mouth 3 (three) times daily as needed. 05/06/15   Devoria AlbeIva Knapp, MD  HYDROcodone-acetaminophen (NORCO/VICODIN) 5-325 MG tablet Take one-two tabs po q 4-6 hrs prn pain 04/19/15   Kendall Justo, PA-C  methocarbamol (ROBAXIN) 500 MG tablet Take 1 tablet (500 mg total) by mouth 3 (three) times daily. 04/19/15   Trianna Lupien, PA-C  metroNIDAZOLE (FLAGYL) 500 MG tablet Take 1 tablet (500 mg total) by mouth 2 (two) times daily. 05/06/15   Devoria AlbeIva Knapp, MD  naproxen (NAPROSYN) 500 MG tablet Take 1 po BID with food prn pain 05/06/15   Devoria AlbeIva Knapp, MD   BP 123/66 mmHg  Pulse 82  Temp(Src) 98.9 F (37.2 C) (Oral)  Resp 20  Ht 5' 5.5" (1.664 m)  Wt 54.432 kg  BMI 19.66 kg/m2  SpO2 100%  LMP 06/27/2015 Physical Exam  Constitutional: She is oriented to person, place, and time. She appears well-developed and well-nourished. No distress.  HENT:  Head: Normocephalic and atraumatic.  Eyes: EOM are normal. Pupils are equal, round,  and reactive to light. Lids are everted and swept, no foreign bodies found. Right eye exhibits exudate. Right eye exhibits no chemosis and no hordeolum. No foreign body present in the right eye. Left eye exhibits exudate. Left eye exhibits no chemosis and no hordeolum. No foreign body present in the left eye. Right conjunctiva is injected. Left conjunctiva is injected.  Moderate purulent discharge from bilateral eyes.  minimal edema of bilateral upper eyelids.  Neck: Normal range of motion. Neck supple. No thyromegaly present.  Cardiovascular: Normal rate, regular rhythm and intact distal  pulses.   Pulmonary/Chest: Effort normal and breath sounds normal. No respiratory distress.  Musculoskeletal: Normal range of motion.  Lymphadenopathy:    She has no cervical adenopathy.  Neurological: She is alert and oriented to person, place, and time. She exhibits normal muscle tone. Coordination normal.  Skin: Skin is warm and dry. No rash noted.  Psychiatric: She has a normal mood and affect.  Nursing note and vitals reviewed.   ED Course  Procedures (including critical care time) Labs Review Labs Reviewed - No data to display  Imaging Review No results found. I have personally reviewed and evaluated these images and lab results as part of my medical decision-making.   EKG Interpretation None           MDM   Final diagnoses:  Bilateral conjunctivitis    Pt with bilateral conjunctivitis.  No visual changes.  No surrounding erythema or edema.  Dispensed tobramycin drops.  Pt agrees to warm wet compresses and ophtho f/u if needed.      Pauline Aus, PA-C 07/28/15 1459  Donnetta Hutching, MD 07/28/15 [phone]

## 2015-09-10 ENCOUNTER — Encounter (HOSPITAL_COMMUNITY): Payer: Self-pay | Admitting: Emergency Medicine

## 2015-09-10 ENCOUNTER — Emergency Department (HOSPITAL_COMMUNITY)
Admission: EM | Admit: 2015-09-10 | Discharge: 2015-09-11 | Disposition: A | Discharge status: Home / Self Care | Payer: BLUE CROSS/BLUE SHIELD | Attending: Emergency Medicine | Admitting: Emergency Medicine

## 2015-09-10 DIAGNOSIS — N760 Acute vaginitis: Secondary | ICD-10-CM | POA: Diagnosis not present

## 2015-09-10 DIAGNOSIS — N390 Urinary tract infection, site not specified: Secondary | ICD-10-CM | POA: Insufficient documentation

## 2015-09-10 DIAGNOSIS — Z79899 Other long term (current) drug therapy: Secondary | ICD-10-CM | POA: Diagnosis not present

## 2015-09-10 DIAGNOSIS — Z87891 Personal history of nicotine dependence: Secondary | ICD-10-CM | POA: Diagnosis not present

## 2015-09-10 DIAGNOSIS — B9689 Other specified bacterial agents as the cause of diseases classified elsewhere: Secondary | ICD-10-CM

## 2015-09-10 DIAGNOSIS — N898 Other specified noninflammatory disorders of vagina: Secondary | ICD-10-CM | POA: Diagnosis present

## 2015-09-10 LAB — PREGNANCY, URINE: Preg Test, Ur: NEGATIVE

## 2015-09-10 NOTE — ED Notes (Signed)
Burning with urination and yellowish discharge that started 3 days ago

## 2015-09-10 NOTE — ED Provider Notes (Signed)
CSN: [id number]     Arrival date & time 09/10/15  2243 History  By signing my name below, I, [name], attest that this documentation has been prepared under the direction and in the presence of Jennifer AlbeIva Jameila Keeny, MD at 2305 AM. Electronically Signed: Randell PatientMarrissa [name], ED Scribe. 09/10/2015. 11:23 PM.   Chief Complaint  Patient presents with  . Vaginal Discharge   The history is provided by the patient. No language interpreter was used.  HPI Comments: Jennifer [name] is a 25 y.o. female with an hx of BV and chlamydia who presents to the Emergency Department complaining of intermittent, gradually worsening, dysuria onset 4 days ago. She reports urinary frequency, vaginal discharge, and sporadic, sharp, suprapubic abdominal pain that lasts for 1 minute before resolving without treatment. Her abdominal pain has no alleviating or exacerbating factors. She notes similar symptoms in the past when she was diagnosed with bacterial vaginosis. Denies recent changes in sexual partners or condom use but states that she takes oral birth control. Denies ETOH use and cigarette smoking. Denies hematuria, nausea, emesis. LMP 09/23/15 and G2P2A0  GYN Dr Emelda FearFerguson  Past Medical History  Diagnosis Date  . Chlamydia   . BV (bacterial vaginosis)   . Nausea and vomiting in pregnancy prior to [redacted] weeks gestation 12/04/2012  . Vaginal discharge in pregnancy in first trimester 12/04/2012  . Pregnant 12/04/2012  . Medical history non-contributory   . Contraceptive management 08/25/2013   Past Surgical History  Procedure Laterality Date  . No past surgeries     Family History  Problem Relation Age of Onset  . Hypertension Mother    Social History  Substance Use Topics  . Smoking status: Former Smoker    Types: Cigarettes    Quit date: 07/16/2010  . Smokeless tobacco: Never Used  . Alcohol Use: No  employed  OB History    Gravida Para Term Preterm AB TAB SAB Ectopic Multiple Living   2 2 2       2       Review of Systems  Gastrointestinal: Positive for abdominal pain. Negative for nausea and vomiting.  Genitourinary: Positive for dysuria, frequency and vaginal discharge. Negative for hematuria.  All other systems reviewed and are negative.   Allergies  Review of patient's allergies indicates no known allergies.  Home Medications   NONE  Prior to Admission medications   Medication Sig Start Date End Date Taking? Authorizing Provider  norgestimate-ethinyl estradiol (SPRINTEC 28) 0.25-35 MG-MCG tablet Take 1 tablet by mouth daily.   Yes Historical Provider, MD  cephALEXin (KEFLEX) 500 MG capsule Take 1 capsule (500 mg total) by mouth 3 (three) times daily. 09/11/15   Jennifer AlbeIva Cuba Natarajan, MD  metroNIDAZOLE (FLAGYL) 500 MG tablet Take 1 tablet (500 mg total) by mouth 2 (two) times daily. 09/11/15   Jennifer AlbeIva Ousmane Seeman, MD  phenazopyridine (PYRIDIUM) 200 MG tablet Take 1 tablet (200 mg total) by mouth 3 (three) times daily. 09/11/15   Jennifer AlbeIva Windy Dudek, MD   BP 112/66 mmHg  Pulse 65  Temp(Src) 97.7 F (36.5 C) (Oral)  Resp 16  Ht 5' 5.5" (1.664 m)  Wt 125 lb (56.7 kg)  BMI 20.48 kg/m2  SpO2 98%  LMP 08/24/2015 (Approximate)  Vital signs normal    Physical Exam  Constitutional: She is oriented to person, place, and time. She appears well-developed and well-nourished.  Non-toxic appearance. She does not appear ill. No distress.  HENT:  Head: Normocephalic and atraumatic.  Right Ear: External ear normal.  Left Ear:  External ear normal.  Nose: Nose normal. No mucosal edema or rhinorrhea.  Mouth/Throat: Oropharynx is clear and moist and mucous membranes are normal. No dental abscesses or uvula swelling.  Eyes: Conjunctivae and EOM are normal. Pupils are equal, round, and reactive to light.  Neck: Normal range of motion and full passive range of motion without pain. Neck supple.  Cardiovascular: Normal rate, regular rhythm and normal heart sounds.  Exam reveals no gallop and no friction rub.   No murmur  heard. Pulmonary/Chest: Effort normal and breath sounds normal. No respiratory distress. She has no wheezes. She has no rhonchi. She has no rales. She exhibits no tenderness and no crepitus.  Abdominal: Soft. Normal appearance and bowel sounds are normal. She exhibits no distension. There is no tenderness. There is no rebound, no guarding and no CVA tenderness.  No CVAT  Genitourinary:  Normal external genitalia, minimal white discharge, nontender normal sized uterus and normal sized ovaries without tenderness.   Musculoskeletal: Normal range of motion. She exhibits no edema or tenderness.  Moves all extremities well.   Neurological: She is alert and oriented to person, place, and time. She has normal strength. No cranial nerve deficit.  Skin: Skin is warm, dry and intact. No rash noted. No erythema. No pallor.  Psychiatric: She has a normal mood and affect. Her speech is normal and behavior is normal. Her mood appears not anxious.  Nursing note and vitals reviewed.   ED Course  Procedures  Medications  phenazopyridine (PYRIDIUM) tablet 200 mg (200 mg Oral Refused 09/11/15 0112)  metroNIDAZOLE (FLAGYL) tablet 500 mg (500 mg Oral Given 09/11/15 0112)  cephALEXin (KEFLEX) capsule 500 mg (500 mg Oral Given 09/11/15 0112)     DIAGNOSTIC STUDIES: Oxygen Saturation is 98% on RA, normal by my interpretation.    COORDINATION OF CARE: 11:02 PM Will order labs. Will return to perform pelvic exam. Discussed treatment plan with pt at bedside and pt agreed to plan. Patient does not want to have a RPR or HIV done.  After reviewing her test results she was given Pyridium 200 mg orally, and started on Flagyl and Keflex.   Labs Review Results for orders placed or performed during the hospital encounter of 09/10/15  Wet prep, genital  Result Value Ref Range   Yeast Wet Prep HPF POC NONE SEEN NONE SEEN   Trich, Wet Prep NONE SEEN NONE SEEN   Clue Cells Wet Prep HPF POC PRESENT (A) NONE SEEN   WBC,  Wet Prep HPF POC FEW (A) NONE SEEN   Sperm NONE SEEN   Pregnancy, urine  Result Value Ref Range   Preg Test, Ur NEGATIVE NEGATIVE  Urinalysis, Routine w reflex microscopic (not at [facility])  Result Value Ref Range   Color, Urine YELLOW YELLOW   APPearance TURBID (A) CLEAR   Specific Gravity, Urine >1.030 (H) 1.005 - 1.030   pH 6.0 5.0 - 8.0   Glucose, UA NEGATIVE NEGATIVE mg/dL   Hgb urine dipstick SMALL (A) NEGATIVE   Bilirubin Urine NEGATIVE NEGATIVE   Ketones, ur NEGATIVE NEGATIVE mg/dL   Protein, ur 30 (A) NEGATIVE mg/dL   Nitrite POSITIVE (A) NEGATIVE   Leukocytes, UA MODERATE (A) NEGATIVE  Urine microscopic-add on  Result Value Ref Range   Squamous Epithelial / LPF 6-30 (A) NONE SEEN   WBC, UA TOO NUMEROUS TO COUNT 0 - 5 WBC/hpf   RBC / HPF 6-30 0 - 5 RBC/hpf   Bacteria, UA FEW (A) NONE SEEN   Laboratory  interpretation all normal except UTI and BV  I have personally reviewed and evaluated these lab results as part of my medical decision-making.   MDM   Final diagnoses:  UTI (lower urinary tract infection)  BV (bacterial vaginosis)    Discharge Medication List as of 09/11/2015 12:51 AM    START taking these medications   Details  cephALEXin (KEFLEX) 500 MG capsule Take 1 capsule (500 mg total) by mouth 3 (three) times daily., Starting 09/11/2015, Until Discontinued, Print    metroNIDAZOLE (FLAGYL) 500 MG tablet Take 1 tablet (500 mg total) by mouth 2 (two) times daily., Starting 09/11/2015, Until Discontinued, Print    phenazopyridine (PYRIDIUM) 200 MG tablet Take 1 tablet (200 mg total) by mouth 3 (three) times daily., Starting 09/11/2015, Until Discontinued, Print        Plan discharge  Jennifer Albe, MD, FACEP   I personally performed the services described in this documentation, which was scribed in my presence. The recorded information has been reviewed and considered.  Jennifer Albe, MD, Concha Pyo, MD 09/11/15 [phone]

## 2015-09-11 LAB — URINE MICROSCOPIC-ADD ON

## 2015-09-11 LAB — WET PREP, GENITAL
Sperm: NONE SEEN
Trich, Wet Prep: NONE SEEN
Yeast Wet Prep HPF POC: NONE SEEN

## 2015-09-11 LAB — URINALYSIS, ROUTINE W REFLEX MICROSCOPIC
Bilirubin Urine: NEGATIVE
GLUCOSE, UA: NEGATIVE mg/dL
Ketones, ur: NEGATIVE mg/dL
Nitrite: POSITIVE — AB
PROTEIN: 30 mg/dL — AB
pH: 6 (ref 5.0–8.0)

## 2015-09-11 MED ORDER — PHENAZOPYRIDINE HCL 100 MG PO TABS
200.0000 mg | ORAL_TABLET | Freq: Once | ORAL | Status: DC
  Filled 2015-09-11 (×2): qty 2

## 2015-09-11 MED ORDER — CEPHALEXIN 500 MG PO CAPS: 500.0000 mg | ORAL_CAPSULE | Freq: Three times a day (TID) | ORAL | Status: DC

## 2015-09-11 MED ORDER — PHENAZOPYRIDINE HCL 200 MG PO TABS: 200.0000 mg | ORAL_TABLET | Freq: Three times a day (TID) | ORAL | Status: DC

## 2015-09-11 MED ORDER — METRONIDAZOLE 500 MG PO TABS
500.0000 mg | ORAL_TABLET | Freq: Once | ORAL | Status: AC
  Administered 2015-09-11: 500 mg via ORAL
  Filled 2015-09-11 (×2): qty 1

## 2015-09-11 MED ORDER — METRONIDAZOLE 500 MG PO TABS: 500.0000 mg | ORAL_TABLET | Freq: Two times a day (BID) | ORAL | Status: DC

## 2015-09-11 MED ORDER — CEPHALEXIN 500 MG PO CAPS
500.0000 mg | ORAL_CAPSULE | Freq: Once | ORAL | Status: AC
  Administered 2015-09-11: 500 mg via ORAL
  Filled 2015-09-11 (×2): qty 1

## 2015-09-11 NOTE — ED Notes (Signed)
Pt states understanding of care given and follow up instructions 

## 2015-09-11 NOTE — Discharge Instructions (Signed)
Take the antibiotics until gone. Use the pyridium for pain on urination. Recheck if you get a fever, pain in your back, nausea, vomiting or if not improving in the next  2-3 days.    Bacterial Vaginosis Bacterial vaginosis is an infection of the vagina. It happens when too many germs (bacteria) grow in the vagina. Having this infection puts you at risk for getting other infections from sex. Treating this infection can help lower your risk for other infections, such as:   Chlamydia.  Gonorrhea.  HIV.  Herpes. HOME CARE  Take your medicine as told by your doctor.  Finish your medicine even if you start to feel better.  Tell your sex partner that you have an infection. They should see their doctor for treatment.  During treatment:  Avoid sex or use condoms correctly.  Do not douche.  Do not drink alcohol unless your doctor tells you it is ok.  Do not breastfeed unless your doctor tells you it is ok. GET HELP IF:  You are not getting better after 3 days of treatment.  You have more grey fluid (discharge) coming from your vagina than before.  You have more pain than before.  You have a fever. MAKE SURE YOU:   Understand these instructions.  Will watch your condition.  Will get help right away if you are not doing well or get worse.   This information is not intended to replace advice given to you by your health care provider. Make sure you discuss any questions you have with your health care provider.   Document Released: 01/25/2008 Document Revised: 05/08/2014 Document Reviewed: 11/27/2012 Elsevier Interactive Patient Education 2016 Elsevier Inc.  Urinary Tract Infection A urinary tract infection (UTI) can occur any place along the urinary tract. The tract includes the kidneys, ureters, bladder, and urethra. A type of germ called bacteria often causes a UTI. UTIs are often helped with antibiotic medicine.  HOME CARE   If given, take antibiotics as told by your  doctor. Finish them even if you start to feel better.  Drink enough fluids to keep your pee (urine) clear or pale yellow.  Avoid tea, drinks with caffeine, and bubbly (carbonated) drinks.  Pee often. Avoid holding your pee in for a long time.  Pee before and after having sex (intercourse).  Wipe from front to back after you poop (bowel movement) if you are a woman. Use each tissue only once. GET HELP RIGHT AWAY IF:   You have back pain.  You have lower belly (abdominal) pain.  You have chills.  You feel sick to your stomach (nauseous).  You throw up (vomit).  Your burning or discomfort with peeing does not go away.  You have a fever.  Your symptoms are not better in 3 days. MAKE SURE YOU:   Understand these instructions.  Will watch your condition.  Will get help right away if you are not doing well or get worse.   This information is not intended to replace advice given to you by your health care provider. Make sure you discuss any questions you have with your health care provider.   Document Released: 10/04/2007 Document Revised: 05/08/2014 Document Reviewed: 11/16/2011 Elsevier Interactive Patient Education [employer]2016 Elsevier Inc.

## 2015-09-13 LAB — GC/CHLAMYDIA PROBE AMP (~~LOC~~) NOT AT ARMC
Chlamydia: NEGATIVE
Neisseria Gonorrhea: NEGATIVE

## 2015-09-23 ENCOUNTER — Telehealth (HOSPITAL_BASED_OUTPATIENT_CLINIC_OR_DEPARTMENT_OTHER): Payer: Self-pay | Admitting: Emergency Medicine

## 2015-10-17 ENCOUNTER — Encounter (HOSPITAL_COMMUNITY): Payer: Self-pay | Admitting: *Deleted

## 2015-10-17 ENCOUNTER — Emergency Department (HOSPITAL_COMMUNITY)
Admission: EM | Admit: 2015-10-17 | Discharge: 2015-10-17 | Disposition: A | Discharge status: Home / Self Care | Payer: BLUE CROSS/BLUE SHIELD | Attending: Emergency Medicine | Admitting: Emergency Medicine

## 2015-10-17 ENCOUNTER — Emergency Department (HOSPITAL_COMMUNITY): Payer: BLUE CROSS/BLUE SHIELD

## 2015-10-17 DIAGNOSIS — Z87891 Personal history of nicotine dependence: Secondary | ICD-10-CM | POA: Insufficient documentation

## 2015-10-17 DIAGNOSIS — S40212A Abrasion of left shoulder, initial encounter: Secondary | ICD-10-CM | POA: Insufficient documentation

## 2015-10-17 DIAGNOSIS — Y929 Unspecified place or not applicable: Secondary | ICD-10-CM | POA: Insufficient documentation

## 2015-10-17 DIAGNOSIS — Y999 Unspecified external cause status: Secondary | ICD-10-CM | POA: Insufficient documentation

## 2015-10-17 DIAGNOSIS — S0181XA Laceration without foreign body of other part of head, initial encounter: Secondary | ICD-10-CM

## 2015-10-17 DIAGNOSIS — Y939 Activity, unspecified: Secondary | ICD-10-CM | POA: Insufficient documentation

## 2015-10-17 DIAGNOSIS — S0990XA Unspecified injury of head, initial encounter: Secondary | ICD-10-CM | POA: Insufficient documentation

## 2015-10-17 DIAGNOSIS — S0182XA Laceration with foreign body of other part of head, initial encounter: Secondary | ICD-10-CM | POA: Insufficient documentation

## 2015-10-17 MED ORDER — TETANUS-DIPHTH-ACELL PERTUSSIS 5-2.5-18.5 LF-MCG/0.5 IM SUSP
0.5000 mL | Freq: Once | INTRAMUSCULAR | Status: AC
Start: 2015-10-17 — End: 2015-10-17
  Administered 2015-10-17: 0.5 mL via INTRAMUSCULAR
  Filled 2015-10-17: qty 0.5

## 2015-10-17 MED ORDER — LIDOCAINE-EPINEPHRINE (PF) 2 %-1:200000 IJ SOLN
10.0000 mL | Freq: Once | INTRAMUSCULAR | Status: DC
  Filled 2015-10-17: qty 20

## 2015-10-17 MED ORDER — HYDROCODONE-ACETAMINOPHEN 5-325 MG PO TABS: 1.0000 | ORAL_TABLET | ORAL | Status: DC | PRN

## 2015-10-17 MED ORDER — OXYCODONE-ACETAMINOPHEN 5-325 MG PO TABS
1.0000 | ORAL_TABLET | Freq: Once | ORAL | Status: AC
  Administered 2015-10-17: 1 via ORAL
  Filled 2015-10-17: qty 1

## 2015-10-17 NOTE — Discharge Instructions (Signed)
Facial Laceration °A facial laceration is a cut on the face. These injuries can be painful and cause bleeding. Some cuts may need to be closed with stitches (sutures), skin adhesive strips, or wound glue. Cuts usually heal quickly but can leave a scar. It can take 1-2 years for the scar to go away completely. °HOME CARE  °· Only take medicines as told by your doctor. °· Follow your doctor's instructions for wound care. °For Stitches: °· Keep the cut clean and dry. °· If you have a bandage (dressing), change it at least once a day. Change the bandage if it gets wet or dirty, or as told by your doctor. °· Wash the cut with soap and water 2 times a day. Rinse the cut with water. Pat it dry with a clean towel. °· Put a thin layer of medicated cream on the cut as told by your doctor. °· You may shower after the first 24 hours. Do not soak the cut in water until the stitches are removed. °· Have your stitches removed as told by your doctor. °· Do not wear any makeup until a few days after your stitches are removed. °For Skin Adhesive Strips: °· Keep the cut clean and dry. °· Do not get the strips wet. You may take a bath, but be careful to keep the cut dry. °· If the cut gets wet, pat it dry with a clean towel. °· The strips will fall off on their own. Do not remove the strips that are still stuck to the cut. °For Wound Glue: °· You may shower or take baths. Do not soak or scrub the cut. Do not swim. Avoid heavy sweating until the glue falls off on its own. After a shower or bath, pat the cut dry with a clean towel. °· Do not put medicine or makeup on your cut until the glue falls off. °· If you have a bandage, do not put tape over the glue. °· Avoid lots of sunlight or tanning lamps until the glue falls off. °· The glue will fall off on its own in 5-10 days. Do not pick at the glue. °After Healing: °· Put sunscreen on the cut for the first year to reduce your scar. °GET HELP IF: °· You have a fever. °GET HELP RIGHT AWAY  IF:  °· Your cut area gets red, painful, or puffy (swollen). °· You see a yellowish-white fluid (pus) coming from the cut. °  °This information is not intended to replace advice given to you by your health care provider. Make sure you discuss any questions you have with your health care provider. °  °Document Released: 10/04/2007 Document Revised: 05/08/2014 Document Reviewed: 11/28/2012 °Elsevier Interactive Patient Education ©2016 Elsevier Inc. ° °Head Injury, Adult °You have a head injury. Headaches and throwing up (vomiting) are common after a head injury. It should be easy to wake up from sleeping. Sometimes you must stay in the hospital. Most problems happen within the first 24 hours. Side effects may occur up to 7-10 days after the injury.  °WHAT ARE THE TYPES OF HEAD INJURIES? °Head injuries can be as minor as a bump. Some head injuries can be more severe. More severe head injuries include: °· A jarring injury to the brain (concussion). °· A bruise of the brain (contusion). This mean there is bleeding in the brain that can cause swelling. °· A cracked skull (skull fracture). °· Bleeding in the brain that collects, clots, and forms a bump (hematoma). °WHEN   SHOULD I GET HELP RIGHT AWAY?  °· You are confused or sleepy. °· You cannot be woken up. °· You feel sick to your stomach (nauseous) or keep throwing up (vomiting). °· Your dizziness or unsteadiness is getting worse. °· You have very bad, lasting headaches that are not helped by medicine. Take medicines only as told by your doctor. °· You cannot use your arms or legs like normal. °· You cannot walk. °· You notice changes in the black spots in the center of the colored part of your eye (pupil). °· You have clear or bloody fluid coming from your nose or ears. °· You have trouble seeing. °During the next 24 hours after the injury, you must stay with someone who can watch you. This person should get help right away (call 911 in the [place] if you start to shake  and are not able to control it (have seizures), you pass out, or you are unable to wake up. °HOW CAN I PREVENT A HEAD INJURY IN THE FUTURE? °· Wear seat belts. °· Wear a helmet while bike riding and playing sports like football. °· Stay away from dangerous activities around the house. °WHEN CAN I RETURN TO NORMAL ACTIVITIES AND ATHLETICS? °See your doctor before doing these activities. You should not do normal activities or play contact sports until 1 week after the following symptoms have stopped: °· Headache that does not go away. °· Dizziness. °· Poor attention. °· Confusion. °· Memory problems. °· Sickness to your stomach or throwing up. °· Tiredness. °· Fussiness. °· Bothered by bright lights or loud noises. °· Anxiousness or depression. °· Restless sleep. °MAKE SURE YOU:  °· Understand these instructions. °· Will watch your condition. °· Will get help right away if you are not doing well or get worse. °  °This information is not intended to replace advice given to you by your health care provider. Make sure you discuss any questions you have with your health care provider. °  °Document Released: 03/30/2008 Document Revised: 05/08/2014 Document Reviewed: 12/23/2012 °Elsevier Interactive Patient Education ©2016 Elsevier Inc. ° °

## 2015-10-17 NOTE — ED Provider Notes (Signed)
THIS IS A SHARED VISIT WITH DR. Effie ShyWENTZ BP 128/77 mmHg  Pulse 102  Temp(Src) 98 F (36.7 C) (Oral)  Ht 5' 0.5" (1.537 m)  Wt 56.246 kg  BMI 23.81 kg/m2  SpO2 100%  LMP 09/26/2015   Jennifer [name] is a 25 y.o. female with a laceration to the forehead  LACERATION REPAIR LACERATION REPAIR Performed by: Jae Bruck Authorized by: Derrion Tritz Consent: Verbal consent obtained. Risks and benefits: risks, benefits and alternatives were discussed Consent given by: patient Patient identity confirmed: provided demographic data Prepped and Draped in normal sterile fashion Wound explored  Laceration Location: forehead Gaping irregular flaps that are gagged, skull exposed  Laceration Length: 4.5 cm   Foreign Bodies noted  Anesthesia: local infiltration  Local anesthetic: lidocaine 2% with epinephrine  Anesthetic total: 4 ml  Irrigation method: syringe Amount of cleaning: normal  Several tiny hard gravel like foreign bodies removed  Skin closure: 5-0 prolene  Number of sutures: 9  Technique: interrupted  Patient tolerance: Patient tolerated the procedure well with no immediate complications.   [address] MunfordvilleM Meilin Brosh, [place] 10/17/15 [id number]  Mancel BaleElliott Wentz, MD 10/19/15 [id number]

## 2015-10-17 NOTE — ED Notes (Signed)
WOUND DRESSED PRIOR TO DISCHARGE
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
## 2015-10-21 ENCOUNTER — Emergency Department (HOSPITAL_COMMUNITY)
Admission: EM | Admit: 2015-10-21 | Discharge: 2015-10-21 | Disposition: A | Discharge status: Home / Self Care | Payer: BLUE CROSS/BLUE SHIELD | Attending: Emergency Medicine | Admitting: Emergency Medicine

## 2015-10-21 ENCOUNTER — Encounter (HOSPITAL_COMMUNITY): Payer: Self-pay | Admitting: *Deleted

## 2015-10-21 DIAGNOSIS — G44319 Acute post-traumatic headache, not intractable: Secondary | ICD-10-CM | POA: Insufficient documentation

## 2015-10-21 DIAGNOSIS — Z79899 Other long term (current) drug therapy: Secondary | ICD-10-CM | POA: Diagnosis not present

## 2015-10-21 DIAGNOSIS — Z87891 Personal history of nicotine dependence: Secondary | ICD-10-CM | POA: Diagnosis not present

## 2015-10-21 MED ORDER — OXYCODONE-ACETAMINOPHEN 5-325 MG PO TABS: 2.0000 | ORAL_TABLET | ORAL | Status: DC | PRN

## 2015-10-21 NOTE — Discharge Instructions (Signed)
Head Injury, Adult °You have received a head injury. It does not appear serious at this time. Headaches and vomiting are common following head injury. It should be easy to awaken from sleeping. Sometimes it is necessary for you to stay in the emergency department for a while for observation. Sometimes admission to the hospital may be needed. After injuries such as yours, most problems occur within the first 24 hours, but side effects may occur up to 7-10 days after the injury. It is important for you to carefully monitor your condition and contact your health care provider or seek immediate medical care if there is a change in your condition. °WHAT ARE THE TYPES OF HEAD INJURIES? °Head injuries can be as minor as a bump. Some head injuries can be more severe. More severe head injuries include: °· A jarring injury to the brain (concussion). °· A bruise of the brain (contusion). This mean there is bleeding in the brain that can cause swelling. °· A cracked skull (skull fracture). °· Bleeding in the brain that collects, clots, and forms a bump (hematoma). °WHAT CAUSES A HEAD INJURY? °A serious head injury is most likely to happen to someone who is in a car wreck and is not wearing a seat belt. Other causes of major head injuries include bicycle or motorcycle accidents, sports injuries, and falls. °HOW ARE HEAD INJURIES DIAGNOSED? °A complete history of the event leading to the injury and your current symptoms will be helpful in diagnosing head injuries. Many times, pictures of the brain, such as CT or MRI are needed to see the extent of the injury. Often, an overnight hospital stay is necessary for observation.  °WHEN SHOULD I SEEK IMMEDIATE MEDICAL CARE?  °You should get help right away if: °· You have confusion or drowsiness. °· You feel sick to your stomach (nauseous) or have continued, forceful vomiting. °· You have dizziness or unsteadiness that is getting worse. °· You have severe, continued headaches not  relieved by medicine. Only take over-the-counter or prescription medicines for pain, fever, or discomfort as directed by your health care provider. °· You do not have normal function of the arms or legs or are unable to walk. °· You notice changes in the black spots in the center of the colored part of your eye (pupil). °· You have a clear or bloody fluid coming from your nose or ears. °· You have a loss of vision. °During the next 24 hours after the injury, you must stay with someone who can watch you for the warning signs. This person should contact local emergency services (911 in the [place] if you have seizures, you become unconscious, or you are unable to wake up. °HOW CAN I PREVENT A HEAD INJURY IN THE FUTURE? °The most important factor for preventing major head injuries is avoiding motor vehicle accidents.  To minimize the potential for damage to your head, it is crucial to wear seat belts while riding in motor vehicles. Wearing helmets while bike riding and playing collision sports (like football) is also helpful. Also, avoiding dangerous activities around the house will further help reduce your risk of head injury.  °WHEN CAN I RETURN TO NORMAL ACTIVITIES AND ATHLETICS? °You should be reevaluated by your health care provider before returning to these activities. If you have any of the following symptoms, you should not return to activities or contact sports until 1 week after the symptoms have stopped: °· Persistent headache. °· Dizziness or vertigo. °· Poor attention and concentration. °· Confusion. °·   Memory problems. °· Nausea or vomiting. °· Fatigue or tire easily. °· Irritability. °· Intolerant of bright lights or loud noises. °· Anxiety or depression. °· Disturbed sleep. °MAKE SURE YOU:  °· Understand these instructions. °· Will watch your condition. °· Will get help right away if you are not doing well or get worse. °  °This information is not intended to replace advice given to you by your health  care provider. Make sure you discuss any questions you have with your health care provider. °  °Document Released: 04/17/2005 Document Revised: 05/08/2014 Document Reviewed: 12/23/2012 °Elsevier Interactive Patient Education ©2016 [employer] Inc[employer] ° °Post-Concussion Syndrome °Post-concussion syndrome describes the symptoms that can occur after a head injury. These symptoms can last from weeks to months. °CAUSES  °It is not clear why some head injuries cause post-concussion syndrome. It can occur whether your head injury was mild or severe and whether you were wearing head protection or not.  °SIGNS AND SYMPTOMS °· Memory difficulties. °· Dizziness. °· Headaches. °· Double vision or blurry vision. °· Sensitivity to light. °· Hearing difficulties. °· Depression. °· Tiredness. °· Weakness. °· Difficulty with concentration. °· Difficulty sleeping or staying asleep. °· Vomiting. °· Poor balance or instability on your feet. °· Slow reaction time. °· Difficulty learning and remembering things you have heard. °DIAGNOSIS  °There is no test to determine whether you have post-concussion syndrome. Your health care provider may order an imaging scan of your brain, such as a CT scan, to check for other problems that may be causing your symptoms (such as a severe injury inside your skull). °TREATMENT  °Usually, these problems disappear over time without medical care. Your health care provider may prescribe medicine to help ease your symptoms. It is important to follow up with a neurologist to evaluate your recovery and address any lingering symptoms or issues. °HOME CARE INSTRUCTIONS  °· Take medicines only as directed by your health care provider. Do not take aspirin. Aspirin can slow blood clotting. °· Sleep with your head slightly elevated to help with headaches. °· Avoid any situation where there is potential for another head injury. This includes football, hockey, soccer, basketball, martial arts, downhill snow sports, and  horseback riding. Your condition will get worse every time you experience a concussion. You should avoid these activities until you are evaluated by the appropriate follow-up health care providers. °· Keep all follow-up visits as directed by your health care provider. This is important. °SEEK MEDICAL CARE IF: °· You have increased problems paying attention or concentrating. °· You have increased difficulty remembering or learning new information. °· You need more time to complete tasks or assignments than before. °· You have increased irritability or decreased ability to cope with stress. °· You have more symptoms than before. °Seek medical care if you have any of the following symptoms for more than two weeks after your injury: °· Lasting (chronic) headaches. °· Dizziness or balance problems. °· Nausea. °· Vision problems. °· Increased sensitivity to noise or light. °· Depression or mood swings. °· Anxiety or irritability. °· Memory problems. °· Difficulty concentrating or paying attention. °· Sleep problems. °· Feeling tired all the time. °SEEK IMMEDIATE MEDICAL CARE IF: °· You have confusion or unusual drowsiness. °· Others find it difficult to wake you up. °· You have nausea or persistent, forceful vomiting. °· You feel like you are moving when you are not (vertigo). Your eyes may move rapidly back and forth. °· You have convulsions or faint. °· You have   severe, persistent headaches that are not relieved by medicine. °· You cannot use your arms or legs normally. °· One of your pupils is larger than the other. °· You have clear or bloody discharge from your nose or ears. °· Your problems are getting worse, not better. °MAKE SURE YOU: °· Understand these instructions. °· Will watch your condition. °· Will get help right away if you are not doing well or get worse. °  °This information is not intended to replace advice given to you by your health care provider. Make sure you discuss any questions you have with your  health care provider. °  °Document Released: 10/07/2001 Document Revised: 05/08/2014 Document Reviewed: 07/23/2013 °Elsevier Interactive Patient Education ©2016 Elsevier Inc. ° °
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
## 2015-10-25 MED FILL — Oxycodone w/ Acetaminophen Tab 5-325 MG: ORAL | Qty: 6 | Status: AC
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
## 2016-01-17 ENCOUNTER — Encounter (HOSPITAL_COMMUNITY): Payer: Self-pay | Admitting: Emergency Medicine

## 2016-01-17 ENCOUNTER — Emergency Department (HOSPITAL_COMMUNITY)
Admission: EM | Admit: 2016-01-17 | Discharge: 2016-01-17 | Disposition: A | Discharge status: Home / Self Care | Payer: BLUE CROSS/BLUE SHIELD | Attending: Emergency Medicine | Admitting: Emergency Medicine

## 2016-01-17 DIAGNOSIS — A6000 Herpesviral infection of urogenital system, unspecified: Secondary | ICD-10-CM

## 2016-01-17 DIAGNOSIS — A6009 Herpesviral infection of other urogenital tract: Secondary | ICD-10-CM | POA: Insufficient documentation

## 2016-01-17 DIAGNOSIS — Z87891 Personal history of nicotine dependence: Secondary | ICD-10-CM | POA: Insufficient documentation

## 2016-01-17 LAB — BASIC METABOLIC PANEL
Anion gap: 7 (ref 5–15)
BUN: 11 mg/dL (ref 6–20)
CHLORIDE: 105 mmol/L (ref 101–111)
CO2: 26 mmol/L (ref 22–32)
Calcium: 8.5 mg/dL — ABNORMAL LOW (ref 8.9–10.3)
Creatinine, Ser: 0.79 mg/dL (ref 0.44–1.00)
Glucose, Bld: 86 mg/dL (ref 65–99)
POTASSIUM: 3.5 mmol/L (ref 3.5–5.1)
SODIUM: 138 mmol/L (ref 135–145)

## 2016-01-17 LAB — CBC WITH DIFFERENTIAL/PLATELET
BASOS ABS: 0.1 10*3/uL (ref 0.0–0.1)
Basophils Relative: 1 %
EOS PCT: 2 %
Eosinophils Absolute: 0.1 10*3/uL (ref 0.0–0.7)
HEMATOCRIT: 40.8 % (ref 36.0–46.0)
HEMOGLOBIN: 13.6 g/dL (ref 12.0–15.0)
LYMPHS ABS: 2.7 10*3/uL (ref 0.7–4.0)
LYMPHS PCT: 40 %
MCH: 30.8 pg (ref 26.0–34.0)
MCHC: 33.3 g/dL (ref 30.0–36.0)
MCV: 92.5 fL (ref 78.0–100.0)
MONOS PCT: 7 %
Monocytes Absolute: 0.5 10*3/uL (ref 0.1–1.0)
NEUTROS ABS: 3.3 10*3/uL (ref 1.7–7.7)
Neutrophils Relative %: 50 %
Platelets: 168 10*3/uL (ref 150–400)
RBC: 4.41 MIL/uL (ref 3.87–5.11)
RDW: 12.4 % (ref 11.5–15.5)
WBC: 6.7 10*3/uL (ref 4.0–10.5)

## 2016-01-17 LAB — URINALYSIS, ROUTINE W REFLEX MICROSCOPIC
Bilirubin Urine: NEGATIVE
GLUCOSE, UA: NEGATIVE mg/dL
Ketones, ur: NEGATIVE mg/dL
LEUKOCYTES UA: NEGATIVE
Nitrite: NEGATIVE
Protein, ur: NEGATIVE mg/dL
SPECIFIC GRAVITY, URINE: 1.025 (ref 1.005–1.030)
pH: 6 (ref 5.0–8.0)

## 2016-01-17 LAB — URINE MICROSCOPIC-ADD ON

## 2016-01-17 LAB — WET PREP, GENITAL
Sperm: NONE SEEN
Trich, Wet Prep: NONE SEEN
Yeast Wet Prep HPF POC: NONE SEEN

## 2016-01-17 LAB — PREGNANCY, URINE: Preg Test, Ur: NEGATIVE

## 2016-01-17 MED ORDER — VALACYCLOVIR HCL 1 G PO TABS: 1000.0000 mg | ORAL_TABLET | Freq: Two times a day (BID) | ORAL | 0 refills | Status: AC

## 2016-01-17 NOTE — Discharge Instructions (Signed)
Follow-up with [facility] if needed.

## 2016-01-17 NOTE — ED Provider Notes (Signed)
[facility]-EMERGENCY DEPT Provider Note   CSN: [id number] Arrival date & time: 01/17/16  1509     History   Chief Complaint Chief Complaint  Patient presents with  . Vaginal Bleeding    HPI Jennifer [name] is a 25 y.o. female.  HPI  Jennifer [name] is a 25 y.o. female who presents to the Emergency Department complaining of vaginal discharge and intermittent vaginal bleeding.  Symptoms have been present for 2 weeks.  She describes crampy, burning vaginal pain and she admits to unprotected sex.  Pain is worse with urination.  She noticed a painful rash to her outer vaginal area.  She denies fever, chills, abdominal pain, vomiting    Past Medical History:  Diagnosis Date  . BV (bacterial vaginosis)   . Chlamydia   . Contraceptive management 08/25/2013  . Medical history non-contributory   . Nausea and vomiting in pregnancy prior to [redacted] weeks gestation 12/04/2012  . Pregnant 12/04/2012  . Vaginal discharge in pregnancy in first trimester 12/04/2012    Patient Active Problem List   Diagnosis Date Noted  . Contraceptive management 08/25/2013  . Labor and delivery, indication for care 07/16/2013  . Uterine size date discrepancy, antepartum 07/09/2013  . Nausea and vomiting in pregnancy prior to [redacted] weeks gestation 12/04/2012  . Vaginal discharge in pregnancy in first trimester 12/04/2012  . BV (bacterial vaginosis) 12/04/2012  . Supervision of other normal pregnancy 12/04/2012    Past Surgical History:  Procedure Laterality Date  . NO PAST SURGERIES      OB History    Gravida Para Term Preterm AB Living   2 2 2     2    SAB TAB Ectopic Multiple Live Births           2       Home Medications    Prior to Admission medications   Medication Sig Start Date End Date Taking? Authorizing Provider  HYDROcodone-acetaminophen (NORCO/VICODIN) 5-325 MG tablet Take 1-2 tablets by mouth every 4 (four) hours as needed. Patient not taking: Reported on 10/21/2015 10/17/15   Mancel Bale, MD    norgestimate-ethinyl estradiol (SPRINTEC 28) 0.25-35 MG-MCG tablet Take 1 tablet by mouth daily.    Historical Provider, MD  oxyCODONE-acetaminophen (PERCOCET/ROXICET) 5-325 MG tablet Take 2 tablets by mouth every 4 (four) hours as needed for severe pain. 10/21/15   Raeford Razor, MD    Family History Family History  Problem Relation Age of Onset  . Hypertension Mother     Social History Social History  Substance Use Topics  . Smoking status: Former Smoker    Types: Cigarettes    Quit date: 07/16/2010  . Smokeless tobacco: Never Used  . Alcohol use No     Allergies   Review of patient's allergies indicates no known allergies.   Review of Systems Review of Systems  Constitutional: Negative for chills and fever.  Respiratory: Negative for shortness of breath.   Cardiovascular: Negative for chest pain.  Gastrointestinal: Negative for abdominal pain, diarrhea, nausea and vomiting.  Genitourinary: Positive for dysuria, genital sores, pelvic pain, vaginal bleeding, vaginal discharge and vaginal pain. Negative for flank pain.  Musculoskeletal: Negative for back pain.  Neurological: Negative for dizziness, weakness, numbness and headaches.     Physical Exam Updated Vital Signs BP 114/83 (BP Location: Left Arm)   Pulse 72   Temp 98 F (36.7 C) (Oral)   Resp 18   Ht 5' 5.5" (1.664 m)   Wt 56.7 kg   LMP  12/31/2015   SpO2 100%   BMI 20.48 kg/m   Physical Exam  Constitutional: She appears well-developed and well-nourished. No distress.  HENT:  Head: Normocephalic.  Mouth/Throat: Oropharynx is clear and moist.  Neck: Normal range of motion.  Cardiovascular: Normal rate, regular rhythm and intact distal pulses.   Pulmonary/Chest: Effort normal. No respiratory distress.  Abdominal: Soft. She exhibits no distension and no mass. There is no tenderness. There is no guarding.  Genitourinary: Uterus normal. Rectal exam shows no tenderness. There is rash, tenderness and lesion  on the right labia. There is rash, tenderness and lesion on the left labia. Cervix exhibits no motion tenderness, no discharge and no friability. Right adnexum displays no mass and no tenderness. Left adnexum displays no mass and no tenderness.  Genitourinary Comments: Pelvic exam chaperoned .  Multiple erythematous vesicles to the external genitalia including bilateral labia and rectum.    Lymphadenopathy:    She has no cervical adenopathy.  Nursing note and vitals reviewed.    ED Treatments / Results  Labs (all labs ordered are listed, but only abnormal results are displayed) Labs Reviewed  WET PREP, GENITAL  HSV CULTURE AND TYPING  PREGNANCY, URINE  URINALYSIS, ROUTINE W REFLEX MICROSCOPIC (NOT [facility])  CBC WITH DIFFERENTIAL/PLATELET  BASIC METABOLIC PANEL  GC/CHLAMYDIA PROBE AMP ([place]) NOT AT [facility]    EKG  EKG Interpretation None       Radiology No results found.  Procedures Procedures (including critical care time)  Medications Ordered in ED Medications - No data to display   Initial Impression / Assessment and Plan / ED Course  I have reviewed the triage vital signs and the nursing notes.  Pertinent labs & imaging results that were available during my care of the patient were reviewed by me and considered in my medical decision making (see chart for details).  Clinical Course    Genital lesions that appear c/w HSV.  Cultures pending.    Pt refused HIV and syphilis testing.  No abd pain .  Will tx with anti-viral and pt agrees to close GYN f/u.  Return precautions given  Final Clinical Impressions(s) / ED Diagnoses   Final diagnoses:  Herpes genitalis    New Prescriptions New Prescriptions   No medications on file     Rosey Bathammy Jakaiya Netherland, PA-C 01/22/16 2047    Donnetta HutchingBrian Cook, MD 01/23/16 [phone]

## 2016-01-17 NOTE — ED Triage Notes (Addendum)
Pt reports on-going vaginal bleeding since her period two weeks ago. States it is light and sporadic, but has not stopped like it usually does. Pt also reports pelvic pain.

## 2016-01-18 LAB — GC/CHLAMYDIA PROBE AMP (~~LOC~~) NOT AT ARMC
Chlamydia: NEGATIVE
Neisseria Gonorrhea: NEGATIVE

## 2016-01-20 ENCOUNTER — Telehealth (HOSPITAL_BASED_OUTPATIENT_CLINIC_OR_DEPARTMENT_OTHER): Payer: Self-pay | Admitting: Emergency Medicine

## 2016-01-20 LAB — HSV CULTURE AND TYPING

## 2016-12-21 ENCOUNTER — Emergency Department (HOSPITAL_COMMUNITY)
Admission: EM | Admit: 2016-12-21 | Discharge: 2016-12-21 | Disposition: A | Discharge status: Home / Self Care | Payer: Self-pay | Attending: Emergency Medicine | Admitting: Emergency Medicine

## 2016-12-21 ENCOUNTER — Emergency Department (HOSPITAL_COMMUNITY): Payer: Self-pay

## 2016-12-21 ENCOUNTER — Encounter (HOSPITAL_COMMUNITY): Payer: Self-pay | Admitting: Emergency Medicine

## 2016-12-21 DIAGNOSIS — R6883 Chills (without fever): Secondary | ICD-10-CM | POA: Insufficient documentation

## 2016-12-21 DIAGNOSIS — Y9389 Activity, other specified: Secondary | ICD-10-CM | POA: Insufficient documentation

## 2016-12-21 DIAGNOSIS — S80211A Abrasion, right knee, initial encounter: Secondary | ICD-10-CM | POA: Insufficient documentation

## 2016-12-21 DIAGNOSIS — Z79899 Other long term (current) drug therapy: Secondary | ICD-10-CM | POA: Insufficient documentation

## 2016-12-21 DIAGNOSIS — Y9289 Other specified places as the place of occurrence of the external cause: Secondary | ICD-10-CM | POA: Insufficient documentation

## 2016-12-21 DIAGNOSIS — S80212A Abrasion, left knee, initial encounter: Secondary | ICD-10-CM | POA: Insufficient documentation

## 2016-12-21 DIAGNOSIS — Z87891 Personal history of nicotine dependence: Secondary | ICD-10-CM | POA: Insufficient documentation

## 2016-12-21 DIAGNOSIS — L089 Local infection of the skin and subcutaneous tissue, unspecified: Secondary | ICD-10-CM | POA: Insufficient documentation

## 2016-12-21 DIAGNOSIS — Y999 Unspecified external cause status: Secondary | ICD-10-CM | POA: Insufficient documentation

## 2016-12-21 LAB — CBC WITH DIFFERENTIAL/PLATELET
BASOS ABS: 0 10*3/uL (ref 0.0–0.1)
BASOS PCT: 0 %
Eosinophils Absolute: 0.3 10*3/uL (ref 0.0–0.7)
Eosinophils Relative: 4 %
HEMATOCRIT: 40.4 % (ref 36.0–46.0)
HEMOGLOBIN: 13.8 g/dL (ref 12.0–15.0)
Lymphocytes Relative: 37 %
Lymphs Abs: 2.7 10*3/uL (ref 0.7–4.0)
MCH: 31 pg (ref 26.0–34.0)
MCHC: 34.2 g/dL (ref 30.0–36.0)
MCV: 90.8 fL (ref 78.0–100.0)
MONO ABS: 0.8 10*3/uL (ref 0.1–1.0)
Monocytes Relative: 11 %
NEUTROS ABS: 3.6 10*3/uL (ref 1.7–7.7)
NEUTROS PCT: 48 %
Platelets: 184 10*3/uL (ref 150–400)
RBC: 4.45 MIL/uL (ref 3.87–5.11)
RDW: 12.3 % (ref 11.5–15.5)
WBC: 7.4 10*3/uL (ref 4.0–10.5)

## 2016-12-21 LAB — COMPREHENSIVE METABOLIC PANEL
ALBUMIN: 4.3 g/dL (ref 3.5–5.0)
ALT: 19 U/L (ref 14–54)
AST: 24 U/L (ref 15–41)
Alkaline Phosphatase: 54 U/L (ref 38–126)
Anion gap: 8 (ref 5–15)
BUN: 14 mg/dL (ref 6–20)
CHLORIDE: 104 mmol/L (ref 101–111)
CO2: 25 mmol/L (ref 22–32)
CREATININE: 0.74 mg/dL (ref 0.44–1.00)
Calcium: 9 mg/dL (ref 8.9–10.3)
GFR calc Af Amer: >60 mL/min (ref >60)
GFR calc non Af Amer: >60 mL/min (ref >60)
GLUCOSE: 92 mg/dL (ref 65–99)
Potassium: 3.5 mmol/L (ref 3.5–5.1)
SODIUM: 137 mmol/L (ref 135–145)
Total Bilirubin: 0.7 mg/dL (ref 0.3–1.2)
Total Protein: 7.7 g/dL (ref 6.5–8.1)

## 2016-12-21 MED ORDER — NAPROXEN 500 MG PO TABS: 500.0000 mg | ORAL_TABLET | Freq: Two times a day (BID) | ORAL | 0 refills | Status: DC

## 2016-12-21 MED ORDER — SULFAMETHOXAZOLE-TRIMETHOPRIM 800-160 MG PO TABS: 1.0000 | ORAL_TABLET | Freq: Two times a day (BID) | ORAL | 0 refills | Status: DC

## 2016-12-21 MED ORDER — KETOROLAC TROMETHAMINE 30 MG/ML IJ SOLN
30.0000 mg | Freq: Once | INTRAMUSCULAR | Status: AC
Start: 2016-12-21 — End: 2016-12-21
  Administered 2016-12-21: 30 mg via INTRAVENOUS
  Filled 2016-12-21: qty 1

## 2016-12-21 MED ORDER — BACITRACIN ZINC 500 UNIT/GM EX OINT
TOPICAL_OINTMENT | Freq: Once | CUTANEOUS | Status: DC
  Filled 2016-12-21: qty 0.9

## 2016-12-21 MED ORDER — CLINDAMYCIN PHOSPHATE 600 MG/50ML IV SOLN
600.0000 mg | Freq: Once | INTRAVENOUS | Status: AC
  Administered 2016-12-21: 600 mg via INTRAVENOUS
  Filled 2016-12-21: qty 50

## 2016-12-21 MED ORDER — CEPHALEXIN 500 MG PO CAPS: 500.0000 mg | ORAL_CAPSULE | Freq: Three times a day (TID) | ORAL | 0 refills | Status: DC

## 2016-12-21 NOTE — Discharge Instructions (Signed)
Elevate your leg. Clean daily with soap and water. Use antibiotic ointment on the abrasions. Take the naproxen for pain, you can take it with acetaminophen 1000 mg 4 times a day. Take the antibiotics until gone (they are both $4 at [facility]).  Recheck if you get a high fever, you get vomiting, worsening swelling, redness, or pain, see a red streak running up your leg or you feel worse. You should notice an improvement over the next 48 hours.
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
## 2017-03-03 DIAGNOSIS — O26899 Other specified pregnancy related conditions, unspecified trimester: Secondary | ICD-10-CM | POA: Insufficient documentation

## 2017-03-03 DIAGNOSIS — O209 Hemorrhage in early pregnancy, unspecified: Secondary | ICD-10-CM | POA: Diagnosis present

## 2017-03-03 DIAGNOSIS — R102 Pelvic and perineal pain: Secondary | ICD-10-CM | POA: Diagnosis not present

## 2017-03-03 DIAGNOSIS — Z87891 Personal history of nicotine dependence: Secondary | ICD-10-CM | POA: Insufficient documentation

## 2017-03-03 DIAGNOSIS — Z3A00 Weeks of gestation of pregnancy not specified: Secondary | ICD-10-CM | POA: Insufficient documentation

## 2017-03-04 ENCOUNTER — Encounter (HOSPITAL_COMMUNITY): Payer: Self-pay | Admitting: *Deleted

## 2017-03-04 ENCOUNTER — Emergency Department (HOSPITAL_COMMUNITY)
Admission: EM | Admit: 2017-03-04 | Discharge: 2017-03-04 | Disposition: A | Discharge status: Home / Self Care | Payer: Medicaid Other | Attending: Emergency Medicine | Admitting: Emergency Medicine

## 2017-03-04 DIAGNOSIS — O26891 Other specified pregnancy related conditions, first trimester: Secondary | ICD-10-CM

## 2017-03-04 DIAGNOSIS — R102 Pelvic and perineal pain: Secondary | ICD-10-CM

## 2017-03-04 LAB — HCG, QUANTITATIVE, PREGNANCY: hCG, Beta Chain, Quant, S: 152852 m[IU]/mL — ABNORMAL HIGH (ref <5)

## 2017-03-04 LAB — CBC WITH DIFFERENTIAL/PLATELET
BASOS ABS: 0 10*3/uL (ref 0.0–0.1)
BASOS PCT: 0 %
Eosinophils Absolute: 0.1 10*3/uL (ref 0.0–0.7)
Eosinophils Relative: 2 %
HCT: 36.3 % (ref 36.0–46.0)
HEMOGLOBIN: 12.6 g/dL (ref 12.0–15.0)
Lymphocytes Relative: 31 %
Lymphs Abs: 2.2 10*3/uL (ref 0.7–4.0)
MCH: 31.2 pg (ref 26.0–34.0)
MCHC: 34.7 g/dL (ref 30.0–36.0)
MCV: 89.9 fL (ref 78.0–100.0)
MONO ABS: 0.5 10*3/uL (ref 0.1–1.0)
Monocytes Relative: 8 %
NEUTROS ABS: 4.2 10*3/uL (ref 1.7–7.7)
Neutrophils Relative %: 59 %
PLATELETS: 175 10*3/uL (ref 150–400)
RBC: 4.04 MIL/uL (ref 3.87–5.11)
RDW: 12.2 % (ref 11.5–15.5)
WBC: 7 10*3/uL (ref 4.0–10.5)

## 2017-03-04 LAB — URINALYSIS, ROUTINE W REFLEX MICROSCOPIC
Bacteria, UA: NONE SEEN
Bilirubin Urine: NEGATIVE
GLUCOSE, UA: NEGATIVE mg/dL
Ketones, ur: NEGATIVE mg/dL
Leukocytes, UA: NEGATIVE
NITRITE: NEGATIVE
PH: 6 (ref 5.0–8.0)
Protein, ur: NEGATIVE mg/dL
SPECIFIC GRAVITY, URINE: 1.02 (ref 1.005–1.030)

## 2017-03-04 LAB — WET PREP, GENITAL
SPERM: NONE SEEN
Trich, Wet Prep: NONE SEEN
YEAST WET PREP: NONE SEEN

## 2017-03-04 NOTE — ED Triage Notes (Signed)
Pt c/o intermittent vaginal cramping and states she had some pink vaginal discharge this morning; pt has her first obgyn appointment this coming week

## 2017-03-04 NOTE — ED Provider Notes (Signed)
[facility] Provider Note   CSN: [id number] Arrival date & time: 03/03/17  2358     History   Chief Complaint Chief Complaint  Patient presents with  . Vaginal Bleeding    HPI Jennifer [name] is a 26 y.o. female.  The history is provided by the patient.  She is in first trimester of pregnancy and comes in complaining of vaginal spotting this morning and pelvic cramping which started this afternoon.  Last menstrual period was sometime in mid August.  Pregnancy was confirmed by home pregnancy test.  She has had symptoms of nausea, urinary frequency, breast swelling and tenderness.  She also endorses a moderate amount of white vaginal discharge.  She is G3P2002.  First prenatal visit is scheduled for next week.  Past Medical History:  Diagnosis Date  . BV (bacterial vaginosis)   . Chlamydia   . Contraceptive management 08/25/2013  . Medical history non-contributory   . Nausea and vomiting in pregnancy prior to [redacted] weeks gestation 12/04/2012  . Pregnant 12/04/2012  . Vaginal discharge in pregnancy in first trimester 12/04/2012    Patient Active Problem List   Diagnosis Date Noted  . Contraceptive management 08/25/2013  . Labor and delivery, indication for care 07/16/2013  . Uterine size date discrepancy, antepartum 07/09/2013  . Nausea and vomiting in pregnancy prior to [redacted] weeks gestation 12/04/2012  . Vaginal discharge in pregnancy in first trimester 12/04/2012  . BV (bacterial vaginosis) 12/04/2012  . Supervision of other normal pregnancy 12/04/2012    Past Surgical History:  Procedure Laterality Date  . NO PAST SURGERIES      OB History    Gravida Para Term Preterm AB Living   3 2 2     2    SAB TAB Ectopic Multiple Live Births           2       Home Medications    Prior to Admission medications   Not on File    Family History Family History  Problem Relation Age of Onset  . Hypertension Mother     Social History Social History    Substance Use Topics  . Smoking status: Former Smoker    Types: Cigarettes    Quit date: 07/16/2010  . Smokeless tobacco: Never Used  . Alcohol use No     Allergies   Patient has no known allergies.   Review of Systems Review of Systems  All other systems reviewed and are negative.    Physical Exam Updated Vital Signs BP 113/65 (BP Location: Right Arm)   Pulse 81   Temp 98.5 F (36.9 C) (Oral)   Resp 18   Ht 5' 5.5" (1.664 m)   Wt 61.2 kg (135 lb)   LMP 12/13/2016   SpO2 100%   BMI 22.12 kg/m   Physical Exam  Nursing note and vitals reviewed.  26 year old female, resting comfortably and in no acute distress. Vital signs are normal. Oxygen saturation is 100%, which is normal. Head is normocephalic and atraumatic. PERRLA, EOMI. Oropharynx is clear. Neck is nontender and supple without adenopathy or JVD. Back is nontender and there is no CVA tenderness. Lungs are clear without rales, wheezes, or rhonchi. Chest is nontender. Heart has regular rate and rhythm without murmur. Abdomen is soft, flat, with mild tenderness in the left suprapubic area.  There are no masses or hepatosplenomegaly and peristalsis is hypoactive. Pelvic: Normal external female genitalia.  Moderate amount of mucoid material around the  cervix without any bleeding.  Fundus is approximately 6 weeks size.  Cervix is nontender and no cervical motion tenderness.  No adnexal masses or tenderness. Extremities have no cyanosis or edema, full range of motion is present. Skin is warm and dry without rash. Neurologic: Mental status is normal, cranial nerves are intact, there are no motor or sensory deficits.  ED Treatments / Results  Labs (all labs ordered are listed, but only abnormal results are displayed) Labs Reviewed  WET PREP, GENITAL - Abnormal; Notable for the following components:      Result Value   Clue Cells Wet Prep HPF POC PRESENT (*)    WBC, Wet Prep HPF POC MANY (*)    All other  components within normal limits  URINALYSIS, ROUTINE W REFLEX MICROSCOPIC - Abnormal; Notable for the following components:   Hgb urine dipstick SMALL (*)    Squamous Epithelial / LPF 0-5 (*)    All other components within normal limits  HCG, QUANTITATIVE, PREGNANCY - Abnormal; Notable for the following components:   hCG, Beta Chain, Quant, S 829,562 (*)    All other components within normal limits  CBC WITH DIFFERENTIAL/PLATELET  RPR  HIV ANTIBODY (ROUTINE TESTING)  GC/CHLAMYDIA PROBE AMP ([place]) NOT AT [facility]   Procedures Procedures (including critical care time)  Medications Ordered in ED Medications - No data to display   Initial Impression / Assessment and Plan / ED Course  I have reviewed the triage vital signs and the nursing notes.  Pertinent labs & imaging results that were available during my care of the patient were reviewed by me and considered in my medical decision making (see chart for details).  Cramping and spotting in first trimester pregnancy.  Old records are reviewed, and she has no relevant past visits. Blood type is O positive. We will check a quantitative hCG, gonorrhea and chlamydia tests, RPR, HIV.  Pelvic exam is reassuring - no bleeding or tenderness.  At this point, no indication for emergent ultrasound.  HCG level has come back over 150,000, and this is also reassuring.  I have discussed this with patient and offered to have ultrasound done in the morning.  She is comfortable with waiting until she sees her obstetrician.  Prenatal intake is scheduled for 3 days from today.  She is to keep that appointment.  Return if cramping gets worse or if she has more bleeding.  Final Clinical Impressions(s) / ED Diagnoses   Final diagnoses:  Pelvic pain affecting pregnancy in first trimester, antepartum    New Prescriptions New Prescriptions   No medications on file     Dione Booze, MD 03/04/17 [phone]

## 2017-03-05 LAB — GC/CHLAMYDIA PROBE AMP (~~LOC~~) NOT AT ARMC
Chlamydia: NEGATIVE
Neisseria Gonorrhea: NEGATIVE

## 2017-03-05 LAB — RPR: RPR Ser Ql: NONREACTIVE

## 2017-03-05 LAB — HIV ANTIBODY (ROUTINE TESTING W REFLEX): HIV Screen 4th Generation wRfx: NONREACTIVE

## 2017-03-07 ENCOUNTER — Ambulatory Visit (INDEPENDENT_AMBULATORY_CARE_PROVIDER_SITE_OTHER): Payer: Medicaid Other

## 2017-03-07 ENCOUNTER — Encounter: Payer: Self-pay | Admitting: Adult Health

## 2017-03-07 ENCOUNTER — Other Ambulatory Visit: Payer: Self-pay | Admitting: Obstetrics & Gynecology

## 2017-03-07 DIAGNOSIS — O3680X0 Pregnancy with inconclusive fetal viability, not applicable or unspecified: Secondary | ICD-10-CM

## 2017-03-07 DIAGNOSIS — Z3A11 11 weeks gestation of pregnancy: Secondary | ICD-10-CM

## 2017-03-07 NOTE — Progress Notes (Signed)
US 11+4 wks,single IUP,positive fht 167 bpm,normal ovaries bilat,crl 47.75 mm,pt refused NT,EDD 09/22/2017

## 2017-03-20 ENCOUNTER — Encounter: Payer: Self-pay | Admitting: Advanced Practice Midwife

## 2017-03-20 ENCOUNTER — Ambulatory Visit (INDEPENDENT_AMBULATORY_CARE_PROVIDER_SITE_OTHER): Payer: Medicaid Other | Admitting: Advanced Practice Midwife

## 2017-03-20 ENCOUNTER — Ambulatory Visit: Payer: Medicaid Other | Admitting: *Deleted

## 2017-03-20 VITALS — BP 112/70 | HR 98 | Wt 134.0 lb

## 2017-03-20 DIAGNOSIS — Z3490 Encounter for supervision of normal pregnancy, unspecified, unspecified trimester: Secondary | ICD-10-CM | POA: Insufficient documentation

## 2017-03-20 DIAGNOSIS — Z1389 Encounter for screening for other disorder: Secondary | ICD-10-CM

## 2017-03-20 DIAGNOSIS — Z331 Pregnant state, incidental: Secondary | ICD-10-CM

## 2017-03-20 DIAGNOSIS — Z3A13 13 weeks gestation of pregnancy: Secondary | ICD-10-CM | POA: Diagnosis not present

## 2017-03-20 DIAGNOSIS — Z363 Encounter for antenatal screening for malformations: Secondary | ICD-10-CM

## 2017-03-20 DIAGNOSIS — Z3482 Encounter for supervision of other normal pregnancy, second trimester: Secondary | ICD-10-CM

## 2017-03-20 DIAGNOSIS — Z3481 Encounter for supervision of other normal pregnancy, first trimester: Secondary | ICD-10-CM

## 2017-03-20 LAB — POCT URINALYSIS DIPSTICK
Glucose, UA: NEGATIVE
Ketones, UA: NEGATIVE
Leukocytes, UA: NEGATIVE
Nitrite, UA: NEGATIVE
PROTEIN UA: NEGATIVE

## 2017-03-20 MED ORDER — PNV PRENATAL PLUS MULTIVITAMIN 27-1 MG PO TABS: 1.0000 | ORAL_TABLET | Freq: Every day | ORAL | 11 refills | Status: DC

## 2017-03-20 NOTE — Progress Notes (Signed)
  Subjective:    Jennifer [name] is a [id number] 263w1d being seen today for her first obstetrical visit.  Her obstetrical history is significant for 2 term svds w/o problems.  Pregnancy history fully reviewed.  Patient reports no complaints.  Vitals:   03/20/17 0928  BP: 112/70  Pulse: 98  Weight: 134 lb (60.8 kg)    HISTORY: OB History  Gravida Para Term Preterm AB Living  3 2 2     2   SAB TAB Ectopic Multiple Live Births          2    # Outcome Date GA Lbr Len/2nd Weight Sex Delivery Anes PTL Lv  3 Current           2 Term 07/16/13 7665w0d 11:23 / 00:05 8 lb 1.8 oz (3.68 kg) F Vag-Spont None N LIV  1 Term 10/29/08 6451w0d  8 lb 0.1 oz (3.632 kg) F Vag-Spont None N LIV     Complications: Pyelonephritis affecting pregnancy in second trimester     Past Medical History:  Diagnosis Date  . BV (bacterial vaginosis)   . Chlamydia   . Contraceptive management 08/25/2013  . Nausea and vomiting in pregnancy prior to [redacted] weeks gestation 12/04/2012  . Pregnant 12/04/2012  . Vaginal discharge in pregnancy in first trimester 12/04/2012   Past Surgical History:  Procedure Laterality Date  . NO PAST SURGERIES     Family History  Problem Relation Age of Onset  . Hypertension Mother      Exam                                      System:     Skin: normal coloration and turgor, no rashes    Neurologic: oriented, normal, normal mood   Extremities: normal strength, tone, and muscle mass   HEENT PERRLA   Mouth/Teeth mucous membranes moist, normal dentition   Neck supple and no masses   Cardiovascular: regular rate and rhythm   Respiratory:  appears well, vitals normal, no respiratory distress, acyanotic   Abdomen: soft, non-tender;  FHR: 150        The nature of [facility] with multiple MDs and other Advanced Practice Providers was explained to patient; also emphasized that residents, students are part of our team.  Assessment:    Pregnancy: [id number] Patient Active Problem List   Diagnosis Date Noted  . Supervision of normal pregnancy 03/20/2017  . BV (bacterial vaginosis) 12/04/2012        Plan:     Initial labs drawn. Continue prenatal vitamins  Problem list reviewed and updated  Reviewed n/v relief measures and warning s/s to report  Reviewed recommended weight gain based on pre-gravid BMI  Encouraged well-balanced diet Genetic Screening discussed Integrated Screen: declined.  Ultrasound discussed; fetal survey: requested.  Return in about 5 weeks (around 04/25/2017) for LROB, AO:ZHYQMVHS:Anatomy.  CRESENZO-DISHMAN,Grayer Sproles 03/20/2017  `

## 2017-03-20 NOTE — Patient Instructions (Signed)
 First Trimester of Pregnancy The first trimester of pregnancy is from week 1 until the end of week 12 (months 1 through 3). A week after a sperm fertilizes an egg, the egg will implant on the wall of the uterus. This embryo will begin to develop into a baby. Genes from you and your partner are forming the baby. The female genes determine whether the baby is a boy or a girl. At 6-8 weeks, the eyes and face are formed, and the heartbeat can be seen on ultrasound. At the end of 12 weeks, all the baby's organs are formed.  Now that you are pregnant, you will want to do everything you can to have a healthy baby. Two of the most important things are to get good prenatal care and to follow your health care provider's instructions. Prenatal care is all the medical care you receive before the baby's birth. This care will help prevent, find, and treat any problems during the pregnancy and childbirth. BODY CHANGES Your body goes through many changes during pregnancy. The changes vary from woman to woman.   You may gain or lose a couple of pounds at first.  You may feel sick to your stomach (nauseous) and throw up (vomit). If the vomiting is uncontrollable, call your health care provider.  You may tire easily.  You may develop headaches that can be relieved by medicines approved by your health care provider.  You may urinate more often. Painful urination may mean you have a bladder infection.  You may develop heartburn as a result of your pregnancy.  You may develop constipation because certain hormones are causing the muscles that push waste through your intestines to slow down.  You may develop hemorrhoids or swollen, bulging veins (varicose veins).  Your breasts may begin to grow larger and become tender. Your nipples may stick out more, and the tissue that surrounds them (areola) may become darker.  Your gums may bleed and may be sensitive to brushing and flossing.  Dark spots or blotches  (chloasma, mask of pregnancy) may develop on your face. This will likely fade after the baby is born.  Your menstrual periods will stop.  You may have a loss of appetite.  You may develop cravings for certain kinds of food.  You may have changes in your emotions from day to day, such as being excited to be pregnant or being concerned that something may go wrong with the pregnancy and baby.  You may have more vivid and strange dreams.  You may have changes in your hair. These can include thickening of your hair, rapid growth, and changes in texture. Some women also have hair loss during or after pregnancy, or hair that feels dry or thin. Your hair will most likely return to normal after your baby is born. WHAT TO EXPECT AT YOUR PRENATAL VISITS During a routine prenatal visit:  You will be weighed to make sure you and the baby are growing normally.  Your blood pressure will be taken.  Your abdomen will be measured to track your baby's growth.  The fetal heartbeat will be listened to starting around week 10 or 12 of your pregnancy.  Test results from any previous visits will be discussed. Your health care provider may ask you:  How you are feeling.  If you are feeling the baby move.  If you have had any abnormal symptoms, such as leaking fluid, bleeding, severe headaches, or abdominal cramping.  If you have any questions. Other   tests that may be performed during your first trimester include:  Blood tests to find your blood type and to check for the presence of any previous infections. They will also be used to check for low iron levels (anemia) and Rh antibodies. Later in the pregnancy, blood tests for diabetes will be done along with other tests if problems develop.  Urine tests to check for infections, diabetes, or protein in the urine.  An ultrasound to confirm the proper growth and development of the baby.  An amniocentesis to check for possible genetic problems.  Fetal  screens for spina bifida and Down syndrome.  You may need other tests to make sure you and the baby are doing well. HOME CARE INSTRUCTIONS  Medicines  Follow your health care provider's instructions regarding medicine use. Specific medicines may be either safe or unsafe to take during pregnancy.  Take your prenatal vitamins as directed.  If you develop constipation, try taking a stool softener if your health care provider approves. Diet  Eat regular, well-balanced meals. Choose a variety of foods, such as meat or vegetable-based protein, fish, milk and low-fat dairy products, vegetables, fruits, and whole grain breads and cereals. Your health care provider will help you determine the amount of weight gain that is right for you.  Avoid raw meat and uncooked cheese. These carry germs that can cause birth defects in the baby.  Eating four or five small meals rather than three large meals a day may help relieve nausea and vomiting. If you start to feel nauseous, eating a few soda crackers can be helpful. Drinking liquids between meals instead of during meals also seems to help nausea and vomiting.  If you develop constipation, eat more high-fiber foods, such as fresh vegetables or fruit and whole grains. Drink enough fluids to keep your urine clear or pale yellow. Activity and Exercise  Exercise only as directed by your health care provider. Exercising will help you:  Control your weight.  Stay in shape.  Be prepared for labor and delivery.  Experiencing pain or cramping in the lower abdomen or low back is a good sign that you should stop exercising. Check with your health care provider before continuing normal exercises.  Try to avoid standing for long periods of time. Move your legs often if you must stand in one place for a long time.  Avoid heavy lifting.  Wear low-heeled shoes, and practice good posture.  You may continue to have sex unless your health care provider directs you  otherwise. Relief of Pain or Discomfort  Wear a good support bra for breast tenderness.   Take warm sitz baths to soothe any pain or discomfort caused by hemorrhoids. Use hemorrhoid cream if your health care provider approves.   Rest with your legs elevated if you have leg cramps or low back pain.  If you develop varicose veins in your legs, wear support hose. Elevate your feet for 15 minutes, 3-4 times a day. Limit salt in your diet. Prenatal Care  Schedule your prenatal visits by the twelfth week of pregnancy. They are usually scheduled monthly at first, then more often in the last 2 months before delivery.  Write down your questions. Take them to your prenatal visits.  Keep all your prenatal visits as directed by your health care provider. Safety  Wear your seat belt at all times when driving.  Make a list of emergency phone numbers, including numbers for family, friends, the hospital, and police and fire departments. General   Tips  Ask your health care provider for a referral to a local prenatal education class. Begin classes no later than at the beginning of month 6 of your pregnancy.  Ask for help if you have counseling or nutritional needs during pregnancy. Your health care provider can offer advice or refer you to specialists for help with various needs.  Do not use hot tubs, steam rooms, or saunas.  Do not douche or use tampons or scented sanitary pads.  Do not cross your legs for long periods of time.  Avoid cat litter boxes and soil used by cats. These carry germs that can cause birth defects in the baby and possibly loss of the fetus by miscarriage or stillbirth.  Avoid all smoking, herbs, alcohol, and medicines not prescribed by your health care provider. Chemicals in these affect the formation and growth of the baby.  Schedule a dentist appointment. At home, brush your teeth with a soft toothbrush and be gentle when you floss. SEEK MEDICAL CARE IF:   You have  dizziness.  You have mild pelvic cramps, pelvic pressure, or nagging pain in the abdominal area.  You have persistent nausea, vomiting, or diarrhea.  You have a bad smelling vaginal discharge.  You have pain with urination.  You notice increased swelling in your face, hands, legs, or ankles. SEEK IMMEDIATE MEDICAL CARE IF:   You have a fever.  You are leaking fluid from your vagina.  You have spotting or bleeding from your vagina.  You have severe abdominal cramping or pain.  You have rapid weight gain or loss.  You vomit blood or material that looks like coffee grounds.  You are exposed to German measles and have never had them.  You are exposed to fifth disease or chickenpox.  You develop a severe headache.  You have shortness of breath.  You have any kind of trauma, such as from a fall or a car accident. Document Released: 04/11/2001 Document Revised: 09/01/2013 Document Reviewed: 02/25/2013 [employer] Patient Information 2015 [employer]. This information is not intended to replace advice given to you by your health care provider. Make sure you discuss any questions you have with your health care provider.   Nausea & Vomiting  Have saltine crackers or pretzels by your bed and eat a few bites before you raise your head out of bed in the morning  Eat small frequent meals throughout the day instead of large meals  Drink plenty of fluids throughout the day to stay hydrated, just don't drink a lot of fluids with your meals.  This can make your stomach fill up faster making you feel sick  Do not brush your teeth right after you eat  Products with real ginger are good for nausea, like ginger ale and ginger hard candy Make sure it says made with real ginger!  Sucking on sour candy like lemon heads is also good for nausea  If your prenatal vitamins make you nauseated, take them at night so you will sleep through the nausea  Sea Bands  If you feel like you need  medicine for the nausea & vomiting please let us know  If you are unable to keep any fluids or food down please let us know   Constipation  Drink plenty of fluid, preferably water, throughout the day  Eat foods high in fiber such as fruits, vegetables, and grains  Exercise, such as walking, is a good way to keep your bowels regular  Drink warm fluids, especially warm   prune juice, or decaf coffee  Eat a 1/2 cup of real oatmeal (not instant), 1/2 cup applesauce, and 1/2-1 cup warm prune juice every day  If needed, you may take Colace (docusate sodium) stool softener once or twice a day to help keep the stool soft. If you are pregnant, wait until you are out of your first trimester (12-14 weeks of pregnancy)  If you still are having problems with constipation, you may take Miralax once daily as needed to help keep your bowels regular.  If you are pregnant, wait until you are out of your first trimester (12-14 weeks of pregnancy)  Safe Medications in Pregnancy   Acne: Benzoyl Peroxide Salicylic Acid  Backache/Headache: Tylenol: 2 regular strength every 4 hours OR              2 Extra strength every 6 hours  Colds/Coughs/Allergies: Benadryl (alcohol free) 25 mg every 6 hours as needed Breath right strips Claritin Cepacol throat lozenges Chloraseptic throat spray Cold-Eeze- up to three times per day Cough drops, alcohol free Flonase (by prescription only) Guaifenesin Mucinex Robitussin DM (plain only, alcohol free) Saline nasal spray/drops Sudafed (pseudoephedrine) & Actifed ** use only after [redacted] weeks gestation and if you do not have high blood pressure Tylenol Vicks Vaporub Zinc lozenges Zyrtec   Constipation: Colace Ducolax suppositories Fleet enema Glycerin suppositories Metamucil Milk of magnesia Miralax Senokot Smooth move tea  Diarrhea: Kaopectate Imodium A-D  *NO pepto Bismol  Hemorrhoids: Anusol Anusol HC Preparation  H Tucks  Indigestion: Tums Maalox Mylanta Zantac  Pepcid  Insomnia: Benadryl (alcohol free) 25mg every 6 hours as needed Tylenol PM Unisom, no Gelcaps  Leg Cramps: Tums MagGel  Nausea/Vomiting:  Bonine Dramamine Emetrol Ginger extract Sea bands Meclizine  Nausea medication to take during pregnancy:  Unisom (doxylamine succinate 25 mg tablets) Take one tablet daily at bedtime. If symptoms are not adequately controlled, the dose can be increased to a maximum recommended dose of two tablets daily (1/2 tablet in the morning, 1/2 tablet mid-afternoon and one at bedtime). Vitamin B6 100mg tablets. Take one tablet twice a day (up to 200 mg per day).  Skin Rashes: Aveeno products Benadryl cream or 25mg every 6 hours as needed Calamine Lotion 1% cortisone cream  Yeast infection: Gyne-lotrimin 7 Monistat 7   **If taking multiple medications, please check labels to avoid duplicating the same active ingredients **take medication as directed on the label ** Do not exceed 4000 mg of tylenol in 24 hours **Do not take medications that contain aspirin or ibuprofen      

## 2017-03-21 ENCOUNTER — Telehealth: Payer: Self-pay | Admitting: Women's Health

## 2017-03-21 LAB — OBSTETRIC PANEL, INCLUDING HIV
Antibody Screen: NEGATIVE
BASOS ABS: 0 10*3/uL (ref 0.0–0.2)
Basos: 0 %
EOS (ABSOLUTE): 0.1 10*3/uL (ref 0.0–0.4)
EOS: 2 %
HEMOGLOBIN: 12.9 g/dL (ref 11.1–15.9)
HEP B S AG: NEGATIVE
HIV SCREEN 4TH GENERATION: NONREACTIVE
Hematocrit: 38.6 % (ref 34.0–46.6)
Immature Grans (Abs): 0 10*3/uL (ref 0.0–0.1)
Immature Granulocytes: 0 %
LYMPHS: 26 %
Lymphocytes Absolute: 1.4 10*3/uL (ref 0.7–3.1)
MCH: 30.7 pg (ref 26.6–33.0)
MCHC: 33.4 g/dL (ref 31.5–35.7)
MCV: 92 fL (ref 79–97)
Monocytes Absolute: 0.4 10*3/uL (ref 0.1–0.9)
Monocytes: 6 %
NEUTROS ABS: 3.7 10*3/uL (ref 1.4–7.0)
NEUTROS PCT: 66 %
PLATELETS: 209 10*3/uL (ref 150–379)
RBC: 4.2 x10E6/uL (ref 3.77–5.28)
RDW: 13.4 % (ref 12.3–15.4)
RPR: NONREACTIVE
RUBELLA: 6.73 {index} (ref >0.99)
Rh Factor: POSITIVE
WBC: 5.6 10*3/uL (ref 3.4–10.8)

## 2017-03-21 LAB — URINALYSIS, ROUTINE W REFLEX MICROSCOPIC
BILIRUBIN UA: NEGATIVE
Glucose, UA: NEGATIVE
Ketones, UA: NEGATIVE
NITRITE UA: NEGATIVE
PH UA: 5.5 (ref 5.0–7.5)
Protein, UA: NEGATIVE
Specific Gravity, UA: 1.026 (ref 1.005–1.030)
UUROB: 0.2 mg/dL (ref 0.2–1.0)

## 2017-03-21 LAB — MICROSCOPIC EXAMINATION
Bacteria, UA: NONE SEEN
CASTS: NONE SEEN /LPF

## 2017-03-21 LAB — VARICELLA ZOSTER ANTIBODY, IGG: VARICELLA: 1856 {index} (ref >165)

## 2017-03-21 LAB — PMP SCREEN PROFILE (10S), URINE
AMPHETAMINE SCREEN URINE: NEGATIVE ng/mL
BARBITURATE SCREEN URINE: NEGATIVE ng/mL
BENZODIAZEPINE SCREEN, URINE: NEGATIVE ng/mL
CANNABINOIDS UR QL SCN: NEGATIVE ng/mL
CREATININE(CRT), U: 236.3 mg/dL (ref 20.0–300.0)
Cocaine (Metab) Scrn, Ur: NEGATIVE ng/mL
METHADONE SCREEN, URINE: NEGATIVE ng/mL
OPIATE SCREEN URINE: NEGATIVE ng/mL
OXYCODONE+OXYMORPHONE UR QL SCN: NEGATIVE ng/mL
PROPOXYPHENE SCREEN URINE: NEGATIVE ng/mL
Ph of Urine: 5.6 (ref 4.5–8.9)
Phencyclidine Qn, Ur: NEGATIVE ng/mL

## 2017-03-21 NOTE — Telephone Encounter (Signed)
Pt returned call. Informed her of blood results. Advised that urine culture has not yet resulted but we should know by end of the week if anything grows in the urine. Patient is agreeable to this and had no further questions.

## 2017-03-21 NOTE — Telephone Encounter (Signed)
Pt called stating that she would like to know the results of her blood work. Please contact pt °

## 2017-03-21 NOTE — Telephone Encounter (Signed)
Attempted to return patient's call, left message to call back.

## 2017-03-28 ENCOUNTER — Telehealth: Payer: Self-pay | Admitting: Women's Health

## 2017-03-28 ENCOUNTER — Other Ambulatory Visit: Payer: Self-pay

## 2017-03-28 MED ORDER — NITROFURANTOIN MONOHYD MACRO 100 MG PO CAPS: 100.0000 mg | ORAL_CAPSULE | Freq: Two times a day (BID) | ORAL | 0 refills | Status: DC

## 2017-03-28 NOTE — Telephone Encounter (Signed)
Patient requesting urine culture result since she has been experiencing burning with urination for 2 weeks. Informed patient result was not in because lab stated there was not enough urine. Advised patient to come leave urine specimen today and antibiotic will be sent to pharmacy. STated she would.

## 2017-03-28 NOTE — Addendum Note (Signed)
Addended by: Moss McRESENZO, Jourdyn Hasler M on: 03/28/2017 02:52 PM   Modules accepted: Orders

## 2017-03-28 NOTE — Telephone Encounter (Signed)
Pt called and spoke w/ RN, requesting urine culture result from new OB visit 11/20, reports burning w/ urination x 2 wks. Urine cx never resulted, lab states not enough urine. Rx macrobid bid x 7d, pt to come leave specimen for urine cx prior to starting antibiotics.  Cheral MarkerKimberly R. Booker, CNM, [facility]NP[facility]BC 03/28/2017 1:54 PM

## 2017-04-20 ENCOUNTER — Encounter: Payer: Self-pay | Admitting: Women's Health

## 2017-04-20 ENCOUNTER — Ambulatory Visit (INDEPENDENT_AMBULATORY_CARE_PROVIDER_SITE_OTHER): Payer: Medicaid Other | Admitting: Women's Health

## 2017-04-20 ENCOUNTER — Ambulatory Visit (INDEPENDENT_AMBULATORY_CARE_PROVIDER_SITE_OTHER): Payer: Medicaid Other

## 2017-04-20 VITALS — BP 98/70 | HR 98 | Wt 139.8 lb

## 2017-04-20 DIAGNOSIS — Z363 Encounter for antenatal screening for malformations: Secondary | ICD-10-CM

## 2017-04-20 DIAGNOSIS — Z3482 Encounter for supervision of other normal pregnancy, second trimester: Secondary | ICD-10-CM | POA: Diagnosis not present

## 2017-04-20 DIAGNOSIS — Z1389 Encounter for screening for other disorder: Secondary | ICD-10-CM | POA: Diagnosis not present

## 2017-04-20 DIAGNOSIS — Z3402 Encounter for supervision of normal first pregnancy, second trimester: Secondary | ICD-10-CM

## 2017-04-20 DIAGNOSIS — Z331 Pregnant state, incidental: Secondary | ICD-10-CM | POA: Diagnosis not present

## 2017-04-20 DIAGNOSIS — Z3A17 17 weeks gestation of pregnancy: Secondary | ICD-10-CM | POA: Diagnosis not present

## 2017-04-20 LAB — POCT URINALYSIS DIPSTICK
Blood, UA: NEGATIVE
GLUCOSE UA: NEGATIVE
NITRITE UA: NEGATIVE
Protein, UA: NEGATIVE

## 2017-04-20 NOTE — Patient Instructions (Signed)
Jennifer [name], I greatly value your feedback.  If you receive a survey following your visit with us today, we appreciate you taking the time to fill it out.  Thanks, [name] Abrial Arrighi, CNM, WHNP-BC   Second Trimester of Pregnancy The second trimester is from week 14 through week 27 (months 4 through 6). The second trimester is often a time when you feel your best. Your body has adjusted to being pregnant, and you begin to feel better physically. Usually, morning sickness has lessened or quit completely, you may have more energy, and you may have an increase in appetite. The second trimester is also a time when the fetus is growing rapidly. At the end of the sixth month, the fetus is about 9 inches long and weighs about 1 pounds. You will likely begin to feel the baby move (quickening) between 16 and 20 weeks of pregnancy. Body changes during your second trimester Your body continues to go through many changes during your second trimester. The changes vary from woman to woman.  Your weight will continue to increase. You will notice your lower abdomen bulging out.  You may begin to get stretch marks on your hips, abdomen, and breasts.  You may develop headaches that can be relieved by medicines. The medicines should be approved by your health care provider.  You may urinate more often because the fetus is pressing on your bladder.  You may develop or continue to have heartburn as a result of your pregnancy.  You may develop constipation because certain hormones are causing the muscles that push waste through your intestines to slow down.  You may develop hemorrhoids or swollen, bulging veins (varicose veins).  You may have back pain. This is caused by: ? Weight gain. ? Pregnancy hormones that are relaxing the joints in your pelvis. ? A shift in weight and the muscles that support your balance.  Your breasts will continue to grow and they will continue to become tender.  Your gums may bleed  and may be sensitive to brushing and flossing.  Dark spots or blotches (chloasma, mask of pregnancy) may develop on your face. This will likely fade after the baby is born.  A dark line from your belly button to the pubic area (linea nigra) may appear. This will likely fade after the baby is born.  You may have changes in your hair. These can include thickening of your hair, rapid growth, and changes in texture. Some women also have hair loss during or after pregnancy, or hair that feels dry or thin. Your hair will most likely return to normal after your baby is born.  What to expect at prenatal visits During a routine prenatal visit:  You will be weighed to make sure you and the fetus are growing normally.  Your blood pressure will be taken.  Your abdomen will be measured to track your baby's growth.  The fetal heartbeat will be listened to.  Any test results from the previous visit will be discussed.  Your health care provider may ask you:  How you are feeling.  If you are feeling the baby move.  If you have had any abnormal symptoms, such as leaking fluid, bleeding, severe headaches, or abdominal cramping.  If you are using any tobacco products, including cigarettes, chewing tobacco, and electronic cigarettes.  If you have any questions.  Other tests that may be performed during your second trimester include:  Blood tests that check for: ? Low iron levels (anemia). ? High  blood sugar that affects pregnant women (gestational diabetes) between 3 and 28 weeks. ? Rh antibodies. This is to check for a protein on red blood cells (Rh factor).  Urine tests to check for infections, diabetes, or protein in the urine.  An ultrasound to confirm the proper growth and development of the baby.  An amniocentesis to check for possible genetic problems.  Fetal screens for spina bifida and Down syndrome.  HIV (human immunodeficiency virus) testing. Routine prenatal testing includes  screening for HIV, unless you choose not to have this test.  Follow these instructions at home: Medicines  Follow your health care provider's instructions regarding medicine use. Specific medicines may be either safe or unsafe to take during pregnancy.  Take a prenatal vitamin that contains at least 600 micrograms (mcg) of folic acid.  If you develop constipation, try taking a stool softener if your health care provider approves. Eating and drinking  Eat a balanced diet that includes fresh fruits and vegetables, whole grains, good sources of protein such as meat, eggs, or tofu, and low-fat dairy. Your health care provider will help you determine the amount of weight gain that is right for you.  Avoid raw meat and uncooked cheese. These carry germs that can cause birth defects in the baby.  If you have low calcium intake from food, talk to your health care provider about whether you should take a daily calcium supplement.  Limit foods that are high in fat and processed sugars, such as fried and sweet foods.  To prevent constipation: ? Drink enough fluid to keep your urine clear or pale yellow. ? Eat foods that are high in fiber, such as fresh fruits and vegetables, whole grains, and beans. Activity  Exercise only as directed by your health care provider. Most women can continue their usual exercise routine during pregnancy. Try to exercise for 30 minutes at least 5 days a week. Stop exercising if you experience uterine contractions.  Avoid heavy lifting, wear low heel shoes, and practice good posture.  A sexual relationship may be continued unless your health care provider directs you otherwise. Relieving pain and discomfort  Wear a good support bra to prevent discomfort from breast tenderness.  Take warm sitz baths to soothe any pain or discomfort caused by hemorrhoids. Use hemorrhoid cream if your health care provider approves.  Rest with your legs elevated if you have leg cramps  or low back pain.  If you develop varicose veins, wear support hose. Elevate your feet for 15 minutes, 3-4 times a day. Limit salt in your diet. Prenatal Care  Write down your questions. Take them to your prenatal visits.  Keep all your prenatal visits as told by your health care provider. This is important. Safety  Wear your seat belt at all times when driving.  Make a list of emergency phone numbers, including numbers for family, friends, the hospital, and police and fire departments. General instructions  Ask your health care provider for a referral to a local prenatal education class. Begin classes no later than the beginning of month 6 of your pregnancy.  Ask for help if you have counseling or nutritional needs during pregnancy. Your health care provider can offer advice or refer you to specialists for help with various needs.  Do not use hot tubs, steam rooms, or saunas.  Do not douche or use tampons or scented sanitary pads.  Do not cross your legs for long periods of time.  Avoid cat litter boxes and soil  used by cats. These carry germs that can cause birth defects in the baby and possibly loss of the fetus by miscarriage or stillbirth.  Avoid all smoking, herbs, alcohol, and unprescribed drugs. Chemicals in these products can affect the formation and growth of the baby.  Do not use any products that contain nicotine or tobacco, such as cigarettes and e-cigarettes. If you need help quitting, ask your health care provider.  Visit your dentist if you have not gone yet during your pregnancy. Use a soft toothbrush to brush your teeth and be gentle when you floss. Contact a health care provider if:  You have dizziness.  You have mild pelvic cramps, pelvic pressure, or nagging pain in the abdominal area.  You have persistent nausea, vomiting, or diarrhea.  You have a bad smelling vaginal discharge.  You have pain when you urinate. Get help right away if:  You have a  fever.  You are leaking fluid from your vagina.  You have spotting or bleeding from your vagina.  You have severe abdominal cramping or pain.  You have rapid weight gain or weight loss.  You have shortness of breath with chest pain.  You notice sudden or extreme swelling of your face, hands, ankles, feet, or legs.  You have not felt your baby move in over an hour.  You have severe headaches that do not go away when you take medicine.  You have vision changes. Summary  The second trimester is from week 14 through week 27 (months 4 through 6). It is also a time when the fetus is growing rapidly.  Your body goes through many changes during pregnancy. The changes vary from woman to woman.  Avoid all smoking, herbs, alcohol, and unprescribed drugs. These chemicals affect the formation and growth your baby.  Do not use any tobacco products, such as cigarettes, chewing tobacco, and e-cigarettes. If you need help quitting, ask your health care provider.  Contact your health care provider if you have any questions. Keep all prenatal visits as told by your health care provider. This is important. This information is not intended to replace advice given to you by your health care provider. Make sure you discuss any questions you have with your health care provider. Document Released: 04/11/2001 Document Revised: 09/23/2015 Document Reviewed: 06/18/2012 [employer] Interactive Patient Education  2017 [employer].

## 2017-04-20 NOTE — Progress Notes (Signed)
   LOW-RISK PREGNANCY VISIT Patient name: Jennifer [name] MRN [id number]  Date of birth: [birth year] Chief Complaint:   Routine Prenatal Visit (ultrasound)  History of Present Illness:   Jennifer [name] is a 26 y.o. 583P2002 female at 1770w6d with an Estimated Date of Delivery: 09/22/17 being seen today for ongoing management of a low-risk pregnancy.  Today she reports no complaints. Poss[place] [facility] [place]. Thinking about BTL.  Contractions: Not present.  .  Movement: Absent. denies leaking of fluid. Review of Systems:   Pertinent items are noted in HPI Denies abnormal vaginal discharge w/ itching/odor/irritation, headaches, visual changes, shortness of breath, chest pain, abdominal pain, severe nausea/vomiting, or problems with urination or bowel movements unless otherwise stated above. Pertinent History Reviewed:  Reviewed past medical,surgical, social, obstetrical and family history.  Reviewed problem list, medications and allergies. Physical Assessment:   Vitals:   04/20/17 0940  BP: 98/70  Pulse: 98  Weight: 139 lb 12.8 oz (63.4 kg)  Body mass index is 22.91 kg/m.        Physical Examination:   General appearance: Well appearing, and in no distress  Mental status: Alert, oriented to person, place, and time  Skin: Warm & dry  Cardiovascular: Normal heart rate noted  Respiratory: Normal respiratory effort, no distress  Abdomen: Soft, gravid, nontender  Pelvic: Cervical exam deferred         Extremities: Edema: None  Fetal Status: Fetal Heart Rate (bpm): +u/s   Movement: Absent    Today's anatomy u/s: US 17+6 wks,cephalic,ant pl gr 0,normal ovaries bilat,svp of fluid 3.8 cm,cx 3.4 cm,fhr 148 bpm,efw 206 g,anatomy complete,no obvious abnormalities   Results for orders placed or performed in visit on 04/20/17 (from the past 24 hour(s))  POCT urinalysis dipstick   Collection Time: 04/20/17 10:16 AM  Result Value Ref Range   Color, UA     Clarity, UA     Glucose, UA neg     Bilirubin, UA     Ketones, UA small    Spec Grav, UA  1.010 - 1.025   Blood, UA neg    pH, UA  5.0 - 8.0   Protein, UA neg    Urobilinogen, UA  0.2 or 1.0 E.U./dL   Nitrite, UA neg    Leukocytes, UA Moderate (2+) (A) Negative   Appearance     Odor      Assessment & Plan:  1) Low-risk pregnancy G3P2002 at 4170w6d with an Estimated Date of Delivery: 09/22/17   2) Thinking about BTL, discussed regret at young age, LARCs   Labs/procedures today: anatomy u/s, urine cx, gc/ct-not done earlier, declines genetic screening and flu shot.   Plan:  Continue routine obstetrical care   Reviewed: Preterm labor symptoms and general obstetric precautions including but not limited to vaginal bleeding, contractions, leaking of fluid and fetal movement were reviewed in detail with the patient.  All questions were answered  Follow-up: Return in about 4 weeks (around 05/18/2017) for LROB.  Orders Placed This Encounter  Procedures  . POCT urinalysis dipstick   Marge DuncansBooker, [place] Schnitker Randall CNM, [facility]BC 04/20/2017 10:37 AM

## 2017-04-20 NOTE — Progress Notes (Signed)
US 17+6 wks,cephalic,ant pl gr 0,normal ovaries bilat,svp of fluid 3.8 cm,cx 3.4 cm,fhr 148 bpm,efw 206 g,anatomy complete,no obvious abnormalities

## 2017-04-22 LAB — URINE CULTURE: Organism ID, Bacteria: NO GROWTH

## 2017-04-23 LAB — GC/CHLAMYDIA PROBE AMP
Chlamydia trachomatis, NAA: NEGATIVE
Neisseria gonorrhoeae by PCR: NEGATIVE

## 2017-04-25 ENCOUNTER — Other Ambulatory Visit: Payer: Self-pay

## 2017-04-25 ENCOUNTER — Encounter: Payer: Self-pay | Admitting: Women's Health

## 2017-05-18 ENCOUNTER — Encounter: Payer: Medicaid Other | Admitting: Obstetrics and Gynecology

## 2017-05-23 ENCOUNTER — Encounter: Payer: Medicaid Other | Admitting: Advanced Practice Midwife

## 2017-05-28 ENCOUNTER — Encounter: Payer: Medicaid Other | Admitting: Obstetrics and Gynecology

## 2017-05-30 ENCOUNTER — Encounter: Payer: Medicaid Other | Admitting: Obstetrics and Gynecology

## 2017-05-31 ENCOUNTER — Encounter: Payer: Medicaid Other | Admitting: Women's Health

## 2017-06-06 ENCOUNTER — Encounter: Payer: Self-pay | Admitting: Women's Health

## 2017-06-06 ENCOUNTER — Ambulatory Visit (INDEPENDENT_AMBULATORY_CARE_PROVIDER_SITE_OTHER): Payer: Medicaid Other | Admitting: Women's Health

## 2017-06-06 VITALS — BP 110/60 | HR 83 | Wt 142.5 lb

## 2017-06-06 DIAGNOSIS — O23592 Infection of other part of genital tract in pregnancy, second trimester: Secondary | ICD-10-CM

## 2017-06-06 DIAGNOSIS — Z3482 Encounter for supervision of other normal pregnancy, second trimester: Secondary | ICD-10-CM

## 2017-06-06 DIAGNOSIS — Z1389 Encounter for screening for other disorder: Secondary | ICD-10-CM | POA: Diagnosis not present

## 2017-06-06 DIAGNOSIS — Z331 Pregnant state, incidental: Secondary | ICD-10-CM | POA: Diagnosis not present

## 2017-06-06 DIAGNOSIS — B9689 Other specified bacterial agents as the cause of diseases classified elsewhere: Secondary | ICD-10-CM

## 2017-06-06 DIAGNOSIS — Z3A24 24 weeks gestation of pregnancy: Secondary | ICD-10-CM

## 2017-06-06 DIAGNOSIS — N898 Other specified noninflammatory disorders of vagina: Secondary | ICD-10-CM | POA: Diagnosis not present

## 2017-06-06 DIAGNOSIS — O26892 Other specified pregnancy related conditions, second trimester: Secondary | ICD-10-CM

## 2017-06-06 DIAGNOSIS — N760 Acute vaginitis: Secondary | ICD-10-CM | POA: Diagnosis not present

## 2017-06-06 LAB — POCT WET PREP (WET MOUNT)
Clue Cells Wet Prep Whiff POC: POSITIVE
Trichomonas Wet Prep HPF POC: ABSENT

## 2017-06-06 LAB — POCT URINALYSIS DIPSTICK
Blood, UA: NEGATIVE
GLUCOSE UA: NEGATIVE
Ketones, UA: NEGATIVE
LEUKOCYTES UA: NEGATIVE
NITRITE UA: NEGATIVE
PROTEIN UA: NEGATIVE

## 2017-06-06 MED ORDER — METRONIDAZOLE 500 MG PO TABS: 500.0000 mg | ORAL_TABLET | Freq: Two times a day (BID) | ORAL | 0 refills | Status: DC

## 2017-06-06 NOTE — Patient Instructions (Signed)
Jennifer [name], I greatly value your feedback.  If you receive a survey following your visit with us today, we appreciate you taking the time to fill it out.  Thanks, [name] Avya Flavell, CNM, WHNP-BC   You will have your sugar test next visit.  Please do not eat or drink anything after midnight the night before you come, not even water.  You will be here for at least two hours.     Call the office [phone]) or [facility] to [facility] if:  You begin to have strong, frequent contractions  Your water breaks.  Sometimes it is a big gush of fluid, sometimes it is just a trickle that keeps getting your panties wet or running down your legs  You have vaginal bleeding.  It is normal to have a small amount of spotting if your cervix was checked.   You don't feel your baby moving like normal.  If you don't, get you something to eat and drink and lay down and focus on feeling your baby move.   If your baby is still not moving like normal, you should call the office or go to [facility].  Second Trimester of Pregnancy The second trimester is from week 13 through week 28, months 4 through 6. The second trimester is often a time when you feel your best. Your body has also adjusted to being pregnant, and you begin to feel better physically. Usually, morning sickness has lessened or quit completely, you may have more energy, and you may have an increase in appetite. The second trimester is also a time when the fetus is growing rapidly. At the end of the sixth month, the fetus is about 9 inches long and weighs about 1 pounds. You will likely begin to feel the baby move (quickening) between 18 and 20 weeks of the pregnancy. BODY CHANGES Your body goes through many changes during pregnancy. The changes vary from woman to woman.   Your weight will continue to increase. You will notice your lower abdomen bulging out.  You may begin to get stretch marks on your hips, abdomen, and breasts.  You may develop  headaches that can be relieved by medicines approved by your health care provider.  You may urinate more often because the fetus is pressing on your bladder.  You may develop or continue to have heartburn as a result of your pregnancy.  You may develop constipation because certain hormones are causing the muscles that push waste through your intestines to slow down.  You may develop hemorrhoids or swollen, bulging veins (varicose veins).  You may have back pain because of the weight gain and pregnancy hormones relaxing your joints between the bones in your pelvis and as a result of a shift in weight and the muscles that support your balance.  Your breasts will continue to grow and be tender.  Your gums may bleed and may be sensitive to brushing and flossing.  Dark spots or blotches (chloasma, mask of pregnancy) may develop on your face. This will likely fade after the baby is born.  A dark line from your belly button to the pubic area (linea nigra) may appear. This will likely fade after the baby is born.  You may have changes in your hair. These can include thickening of your hair, rapid growth, and changes in texture. Some women also have hair loss during or after pregnancy, or hair that feels dry or thin. Your hair will most likely return to normal after your  baby is born. WHAT TO EXPECT AT YOUR PRENATAL VISITS During a routine prenatal visit:  You will be weighed to make sure you and the fetus are growing normally.  Your blood pressure will be taken.  Your abdomen will be measured to track your baby's growth.  The fetal heartbeat will be listened to.  Any test results from the previous visit will be discussed. Your health care provider may ask you:  How you are feeling.  If you are feeling the baby move.  If you have had any abnormal symptoms, such as leaking fluid, bleeding, severe headaches, or abdominal cramping.  If you have any questions. Other tests that may be  performed during your second trimester include:  Blood tests that check for:  Low iron levels (anemia).  Gestational diabetes (between 24 and 28 weeks).  Rh antibodies.  Urine tests to check for infections, diabetes, or protein in the urine.  An ultrasound to confirm the proper growth and development of the baby.  An amniocentesis to check for possible genetic problems.  Fetal screens for spina bifida and Down syndrome. HOME CARE INSTRUCTIONS   Avoid all smoking, herbs, alcohol, and unprescribed drugs. These chemicals affect the formation and growth of the baby.  Follow your health care provider's instructions regarding medicine use. There are medicines that are either safe or unsafe to take during pregnancy.  Exercise only as directed by your health care provider. Experiencing uterine cramps is a good sign to stop exercising.  Continue to eat regular, healthy meals.  Wear a good support bra for breast tenderness.  Do not use hot tubs, steam rooms, or saunas.  Wear your seat belt at all times when driving.  Avoid raw meat, uncooked cheese, cat litter boxes, and soil used by cats. These carry germs that can cause birth defects in the baby.  Take your prenatal vitamins.  Try taking a stool softener (if your health care provider approves) if you develop constipation. Eat more high-fiber foods, such as fresh vegetables or fruit and whole grains. Drink plenty of fluids to keep your urine clear or pale yellow.  Take warm sitz baths to soothe any pain or discomfort caused by hemorrhoids. Use hemorrhoid cream if your health care provider approves.  If you develop varicose veins, wear support hose. Elevate your feet for 15 minutes, 3-4 times a day. Limit salt in your diet.  Avoid heavy lifting, wear low heel shoes, and practice good posture.  Rest with your legs elevated if you have leg cramps or low back pain.  Visit your dentist if you have not gone yet during your pregnancy.  Use a soft toothbrush to brush your teeth and be gentle when you floss.  A sexual relationship may be continued unless your health care provider directs you otherwise.  Continue to go to all your prenatal visits as directed by your health care provider. SEEK MEDICAL CARE IF:   You have dizziness.  You have mild pelvic cramps, pelvic pressure, or nagging pain in the abdominal area.  You have persistent nausea, vomiting, or diarrhea.  You have a bad smelling vaginal discharge.  You have pain with urination. SEEK IMMEDIATE MEDICAL CARE IF:   You have a fever.  You are leaking fluid from your vagina.  You have spotting or bleeding from your vagina.  You have severe abdominal cramping or pain.  You have rapid weight gain or loss.  You have shortness of breath with chest pain.  You notice sudden or extreme   swelling of your face, hands, ankles, feet, or legs.  You have not felt your baby move in over an hour.  You have severe headaches that do not go away with medicine.  You have vision changes. Document Released: 04/11/2001 Document Revised: 04/22/2013 Document Reviewed: 06/18/2012 [facility] Patient Information 2015 [place], [place]. This information is not intended to replace advice given to you by your health care provider. Make sure you discuss any questions you have with your health care provider.

## 2017-06-06 NOTE — Progress Notes (Signed)
LOW-RISK PREGNANCY VISIT Patient name: Jennifer [name] MRN [id number]  Date of birth: [birth year] Chief Complaint:   Routine Prenatal Visit  History of Present Illness:   Jennifer [name] is a 27 y.o. 873P2002 female at 4638w4d with an Estimated Date of Delivery: 09/22/17 being seen today for ongoing management of a low-risk pregnancy.  Today she reports yellow d/c w/ occ odor, no itching/irritation. [place] 1st part of march.  Contractions: Not present. Vag. Bleeding: None.  Movement: Present. denies leaking of fluid. Review of Systems:   Pertinent items are noted in HPI Denies abnormal vaginal discharge w/ itching/odor/irritation, headaches, visual changes, shortness of breath, chest pain, abdominal pain, severe nausea/vomiting, or problems with urination or bowel movements unless otherwise stated above. Pertinent History Reviewed:  Reviewed past medical,surgical, social, obstetrical and family history.  Reviewed problem list, medications and allergies. Physical Assessment:   Vitals:   06/06/17 1453  BP: 110/60  Pulse: 83  Weight: 142 lb 8 oz (64.6 kg)  Body mass index is 23.35 kg/m.        Physical Examination:   General appearance: Well appearing, and in no distress  Mental status: Alert, oriented to person, place, and time  Skin: Warm & dry  Cardiovascular: Normal heart rate noted  Respiratory: Normal respiratory effort, no distress  Abdomen: Soft, gravid, nontender  Pelvic: cx visually closed, mod amt thin yellow malodorous d/c         Extremities: Edema: None  Fetal Status:     Movement: Present    Results for orders placed or performed in visit on 06/06/17 (from the past 24 hour(s))  POCT urinalysis dipstick   Collection Time: 06/06/17  2:56 PM  Result Value Ref Range   Color, UA     Clarity, UA     Glucose, UA neg    Bilirubin, UA     Ketones, UA neg    Spec Grav, UA  1.010 - 1.025   Blood, UA neg    pH, UA  5.0 - 8.0   Protein, UA  neg    Urobilinogen, UA  0.2 or 1.0 E.U./dL   Nitrite, UA neg    Leukocytes, UA Negative Negative   Appearance     Odor    POCT Wet Prep Mellody Drown(Wet [place])   Collection Time: 06/06/17  3:13 PM  Result Value Ref Range   Source Wet Prep POC vaginal    WBC, Wet Prep HPF POC few    Bacteria Wet Prep HPF POC Few Few   BACTERIA WET PREP MORPHOLOGY POC     Clue Cells Wet Prep HPF POC Many (A) None   Clue Cells Wet Prep Whiff POC Positive Whiff    Yeast Wet Prep HPF POC None    KOH Wet Prep POC     Trichomonas Wet Prep HPF POC Absent Absent    Assessment & Plan:  1) Low-risk pregnancy G3P2002 at 938w4d with an Estimated Date of Delivery: 09/22/17   2) BV, Rx metronidazole 500mg  BID x 7d for BV, no sex while taking   3) Moving to [place] in March> hasn't found new ob yet, when she does sign release and we will fax records   Meds:  Meds ordered this encounter  Medications  . metroNIDAZOLE (FLAGYL) 500 MG tablet    Sig: Take 1 tablet (500 mg total) by mouth 2 (two) times daily.    Dispense:  14 tablet    Refill:  0  Order Specific Question:   Supervising Provider    Answer:   Lazaro Arms [[id number]]   Labs/procedures today: wet prep  Plan:  Continue routine obstetrical care   Reviewed: Preterm labor symptoms and general obstetric precautions including but not limited to vaginal bleeding, contractions, leaking of fluid and fetal movement were reviewed in detail with the patient.  All questions were answered  Follow-up: Return in about 4 weeks (around 07/04/2017) for LROB, PN2.  Orders Placed This Encounter  Procedures  . POCT urinalysis dipstick  . POCT [employer] ([address])   Marge Duncans CNM, [facility] 06/06/2017 3:14 PM

## 2017-07-04 ENCOUNTER — Encounter: Payer: Self-pay | Admitting: *Deleted

## 2017-07-04 ENCOUNTER — Other Ambulatory Visit: Payer: Medicaid Other

## 2017-07-04 ENCOUNTER — Encounter: Payer: Medicaid Other | Admitting: Advanced Practice Midwife

## 2017-11-10 ENCOUNTER — Emergency Department (HOSPITAL_COMMUNITY)
Admission: EM | Admit: 2017-11-10 | Discharge: 2017-11-10 | Disposition: A | Discharge status: Home / Self Care | Payer: Self-pay | Attending: Emergency Medicine | Admitting: Emergency Medicine

## 2017-11-10 ENCOUNTER — Encounter (HOSPITAL_COMMUNITY): Payer: Self-pay | Admitting: Nurse Practitioner

## 2017-11-10 ENCOUNTER — Emergency Department (HOSPITAL_COMMUNITY): Payer: Self-pay

## 2017-11-10 DIAGNOSIS — Z87891 Personal history of nicotine dependence: Secondary | ICD-10-CM | POA: Insufficient documentation

## 2017-11-10 DIAGNOSIS — M79672 Pain in left foot: Secondary | ICD-10-CM | POA: Insufficient documentation

## 2017-11-10 DIAGNOSIS — Z79899 Other long term (current) drug therapy: Secondary | ICD-10-CM | POA: Insufficient documentation

## 2017-11-10 NOTE — Discharge Instructions (Addendum)
You may alternate taking Tylenol and Ibuprofen as needed for pain control. You may take 400-600 mg of ibuprofen every 6 hours and [id number] mg of Tylenol every 6 hours. Do not exceed 4000 mg of Tylenol daily as this can lead to liver damage. Also, make sure to take Ibuprofen with meals as it can cause an upset stomach. Do not take other NSAIDs while taking Ibuprofen such as (Aleve, Naprosyn, Aspirin, Celebrex, etc) and do not take more than the prescribed dose as this can lead to ulcers and bleeding in your GI tract. You may use warm and cold compresses to help with your symptoms.   Please follow up with your primary doctor within the next 7-10 days for re-evaluation and further treatment of your symptoms.  You were given information to follow-up with an orthopedic doctor if you have persistent symptoms.  You will need to call the office to make an appointment for follow-up.  Please return to the ER sooner if you have any new or worsening symptoms.

## 2017-11-10 NOTE — ED Provider Notes (Signed)
[facility]-EMERGENCY DEPT Provider Note   CSN: [id number] Arrival date & time: 11/10/17  1832     History   Chief Complaint Chief Complaint  Patient presents with  . Foot Pain    Left    HPI Jennifer AfricaBrooke R Amadon is a 27 y.o. female.  HPI   Patient is a 27 year old female with a history of BV, chlamydia, who presents emergency department today to be evaluated for left foot and ankle pain that began on 10/28/2017.  She states that her daughter jumped in the pool without a flotation device and she jumped in the pool afterwards.  States that pool was shallow and she landed on her foot with a lot of force.  Since then she has had persistent pain.  Rates it 7-8/10 when walking.  Rates it 2/10 at rest.  Has been ambulatory since this occurred.  Has tried Tylenol and Motrin with temporary relief.  Has also tried warm and cool compresses but is still having persistent pain.  No other injuries.  No numbness or weakness to the foot.  Past Medical History:  Diagnosis Date  . BV (bacterial vaginosis)   . Chlamydia   . Contraceptive management 08/25/2013  . Nausea and vomiting in pregnancy prior to [redacted] weeks gestation 12/04/2012  . Pregnant 12/04/2012  . Vaginal discharge in pregnancy in first trimester 12/04/2012    Patient Active Problem List   Diagnosis Date Noted  . Supervision of normal pregnancy 03/20/2017  . BV (bacterial vaginosis) 12/04/2012    Past Surgical History:  Procedure Laterality Date  . NO PAST SURGERIES       OB History    Gravida  3   Para  2   Term  2   Preterm      AB      Living  2     SAB      TAB      Ectopic      Multiple      Live Births  2            Home Medications    Prior to Admission medications   Medication Sig Start Date End Date Taking? Authorizing Provider  metroNIDAZOLE (FLAGYL) 500 MG tablet Take 1 tablet (500 mg total) by mouth 2 (two) times daily. 06/06/17   Cheral MarkerBooker, Kimberly R, CNM  Prenatal Vit-Fe  Fumarate-FA (PNV PRENATAL PLUS MULTIVITAMIN) 27-1 MG TABS Take 1 tablet by mouth daily. 03/20/17   Jacklyn Shellresenzo-Dishmon, Frances, CNM    Family History Family History  Problem Relation Age of Onset  . Hypertension Mother     Social History Social History   Tobacco Use  . Smoking status: Former Smoker    Types: Cigarettes    Last attempt to quit: 07/16/2010    Years since quitting: 7.3  . Smokeless tobacco: Never Used  Substance Use Topics  . Alcohol use: No  . Drug use: No     Allergies   Patient has no known allergies.   Review of Systems Review of Systems  Constitutional: Negative for fever.  Musculoskeletal:       Foot and ankle pain  Skin: Negative for wound.  Neurological: Negative for headaches.     Physical Exam Updated Vital Signs BP 120/65 (BP Location: Left Arm)   Pulse 65   Temp 99 F (37.2 C) (Oral)   Resp 18   LMP  (LMP Unknown)   SpO2 97%   Breastfeeding? Unknown   Physical Exam  Constitutional: She appears well-developed and well-nourished. No distress.  HENT:  Head: Normocephalic and atraumatic.  Eyes: Conjunctivae are normal.  Neck: Neck supple.  Cardiovascular: Normal rate.  Pulmonary/Chest: Effort normal.  Musculoskeletal: Normal range of motion.  TTP to the inferior aspect of the left lateral malleolus with mild swelling, TTP to the posterior aspect of the heel. Achilles tendon intact and nontender.  Ecchymosis noted to the plantar surface of the foot.    Neurological: She is alert.  Skin: Skin is warm and dry. Capillary refill takes less than 2 seconds.  Psychiatric: She has a normal mood and affect.  Nursing note and vitals reviewed.   ED Treatments / Results  Labs (all labs ordered are listed, but only abnormal results are displayed) Labs Reviewed - No data to display  EKG None  Radiology Dg Ankle Complete Left  Result Date: 11/10/2017 CLINICAL DATA:  Pt states 2 weeks ago she went a shallow pool and landed on her left  ankle. Pt states it was a hard impact and has since had generalized left foot and anlke pain. EXAM: LEFT ANKLE COMPLETE - 3+ VIEW COMPARISON:  None. FINDINGS: There is no evidence of fracture, dislocation, or joint effusion. There is no evidence of arthropathy or other focal bone abnormality. Soft tissues are unremarkable. IMPRESSION: Negative. Electronically Signed   By: Amie Portland M.D.   On: 11/10/2017 19:42   Dg Foot Complete Left  Result Date: 11/10/2017 CLINICAL DATA:  Pt states 2 weeks ago she went a shallow pool and landed on her left ankle. Pt states it was a hard impact and has since had generalized left foot and anlke pain. EXAM: LEFT FOOT - COMPLETE 3+ VIEW COMPARISON:  None. FINDINGS: There is no evidence of fracture or dislocation. There is no evidence of arthropathy or other focal bone abnormality. Soft tissues are unremarkable. IMPRESSION: Negative. Electronically Signed   By: Amie Portland M.D.   On: 11/10/2017 19:42    Procedures Procedures (including critical care time)  Medications Ordered in ED Medications - No data to display   Initial Impression / Assessment and Plan / ED Course  I have reviewed the triage vital signs and the nursing notes.  Pertinent labs & imaging results that were available during my care of the patient were reviewed by me and considered in my medical decision making (see chart for details).     Final Clinical Impressions(s) / ED Diagnoses   Final diagnoses:  Foot pain, left   Patient presenting with left foot pain and ankle pain.  Pain is most severe to the left heel.  Vital signs stable.  Afebrile.  Symptoms began 3 weeks ago after a fall.  She has been ambulatory since then.  Neurovascularly intact.  Tenderness to the heel and to the inferior aspect of the left lateral malleolus.  Offered postop shoe and crutches however patient declines crutches.  Will give postop shoe as patient's pain is mostly located to the left heel.  Gave referral to  orthopedics if she has persistent symptoms.  Advised Tylenol and ibuprofen for symptoms as well as rice protocol.  Strict return precautions discussed for any new or worsening symptoms.  All questions were answered and patient understands plan reasons to return.  ED Discharge Orders    None       Rayne Du 11/10/17 Orlie Dakin, MD 11/10/17 2238

## 2017-11-10 NOTE — ED Triage Notes (Signed)
Pt is c/o left ankle pain that she suspects she might has sprained it on 11/27/17.

## 2018-03-27 ENCOUNTER — Emergency Department (HOSPITAL_COMMUNITY)
Admission: EM | Admit: 2018-03-27 | Discharge: 2018-03-28 | Disposition: A | Discharge status: Home / Self Care | Payer: Self-pay | Attending: Emergency Medicine | Admitting: Emergency Medicine

## 2018-03-27 ENCOUNTER — Encounter (HOSPITAL_COMMUNITY): Payer: Self-pay | Admitting: Emergency Medicine

## 2018-03-27 ENCOUNTER — Other Ambulatory Visit: Payer: Self-pay

## 2018-03-27 DIAGNOSIS — Z79899 Other long term (current) drug therapy: Secondary | ICD-10-CM | POA: Insufficient documentation

## 2018-03-27 DIAGNOSIS — Z87891 Personal history of nicotine dependence: Secondary | ICD-10-CM | POA: Insufficient documentation

## 2018-03-27 DIAGNOSIS — N3000 Acute cystitis without hematuria: Secondary | ICD-10-CM | POA: Insufficient documentation

## 2018-03-27 LAB — URINALYSIS, ROUTINE W REFLEX MICROSCOPIC
BILIRUBIN URINE: NEGATIVE
GLUCOSE, UA: 150 mg/dL — AB
Ketones, ur: NEGATIVE mg/dL
Nitrite: NEGATIVE
PH: 5 (ref 5.0–8.0)
Protein, ur: NEGATIVE mg/dL
Specific Gravity, Urine: 1.02 (ref 1.005–1.030)
WBC, UA: >50 WBC/hpf — ABNORMAL HIGH (ref 0–5)

## 2018-03-27 MED ORDER — ACETAMINOPHEN 500 MG PO TABS
1000.0000 mg | ORAL_TABLET | Freq: Once | ORAL | Status: AC
  Administered 2018-03-27: 1000 mg via ORAL
  Filled 2018-03-27: qty 2

## 2018-03-27 NOTE — ED Triage Notes (Signed)
Pt presents with low back pain and urinary symptoms x3 days. Patient states fever 105 at home. Patient took 600 mg of ibuprofen and theraflu PTA. Patient complaining of frequency and burning with urination. Patient denies N/V/D.

## 2018-03-28 MED ORDER — CEPHALEXIN 500 MG PO CAPS
500.0000 mg | ORAL_CAPSULE | Freq: Once | ORAL | Status: AC
  Administered 2018-03-28: 500 mg via ORAL
  Filled 2018-03-28: qty 1

## 2018-03-28 MED ORDER — CEPHALEXIN 500 MG PO CAPS: 500.0000 mg | ORAL_CAPSULE | Freq: Two times a day (BID) | ORAL | 0 refills | Status: AC

## 2018-03-28 MED ORDER — ONDANSETRON HCL 4 MG PO TABS: 4.0000 mg | ORAL_TABLET | Freq: Four times a day (QID) | ORAL | 0 refills | Status: DC

## 2018-03-28 NOTE — ED Provider Notes (Signed)
[facility]-EMERGENCY DEPT Provider Note   CSN: [id number] Arrival date & time: 03/27/18  2152     History   Chief Complaint Chief Complaint  Patient presents with  . Fever  . Urinary Symptoms    HPI Jennifer [name] is a 27 y.o. female with a history of bacterial vaginosis and chlamydia who presents to the emergency department with a chief complaint of fever.  The patient endorses fever, bilateral low back pain, nausea, and urinary frequency and dysuria for the last 3 days.  Low back pain is constant.  She is unable to characterize it.  No known aggravating or alleviating factors.  She states she checked her temperature at home and it was 105 immediately prior to arrival.  She has been treating her symptoms with 600 mg of ibuprofen and TheraFlu.  She reports that she is 7 months postpartum and is breast-feeding.  She is not up-to-date on her flu shot.  No known sick contacts.  She denies chills, myalgias, URI symptoms, cough, headache, abdominal pain, vomiting, hematuria, constipation, diarrhea, vaginal pain, bleeding, itching, or discharge.  Is sexually active with one female partner.  The history is provided by the patient. No language interpreter was used.    Past Medical History:  Diagnosis Date  . BV (bacterial vaginosis)   . Chlamydia   . Contraceptive management 08/25/2013  . Nausea and vomiting in pregnancy prior to [redacted] weeks gestation 12/04/2012  . Pregnant 12/04/2012  . Vaginal discharge in pregnancy in first trimester 12/04/2012    Patient Active Problem List   Diagnosis Date Noted  . Supervision of normal pregnancy 03/20/2017  . BV (bacterial vaginosis) 12/04/2012    Past Surgical History:  Procedure Laterality Date  . NO PAST SURGERIES       OB History    Gravida  3   Para  2   Term  2   Preterm      AB      Living  2     SAB      TAB      Ectopic      Multiple      Live Births  2            Home Medications     Prior to Admission medications   Medication Sig Start Date End Date Taking? Authorizing Provider  ERRIN 0.35 MG tablet Take 1 tablet by mouth daily.  03/04/18  Yes [provider]  ibuprofen (ADVIL,MOTRIN) 200 MG tablet Take 200 mg by mouth every 6 (six) hours as needed for moderate pain.   Yes [provider]  Phenylephrine-DM (THERAFLU COLD/COUGH DAYTIME PO) Take 30 mLs by mouth daily as needed (cold symptoms).   Yes [provider]  cephALEXin (KEFLEX) 500 MG capsule Take 1 capsule (500 mg total) by mouth 2 (two) times daily for 5 days. 03/28/18 04/02/18  Kaydin Karbowski A, PA-C  ondansetron (ZOFRAN) 4 MG tablet Take 1 tablet (4 mg total) by mouth every 6 (six) hours. 03/28/18   Cannon Arreola, Coral Else, PA-C    Family History Family History  Problem Relation Age of Onset  . Hypertension Mother     Social History Social History   Tobacco Use  . Smoking status: Former Smoker    Types: Cigarettes    Last attempt to quit: 07/16/2010    Years since quitting: 7.7  . Smokeless tobacco: Never Used  Substance Use Topics  . Alcohol use: Yes    Comment:  Occassional  . Drug use: No     Allergies   Patient has no known allergies.   Review of Systems Review of Systems  Constitutional: Positive for fever. Negative for activity change and chills.  HENT: Negative for congestion.   Eyes: Negative for visual disturbance.  Respiratory: Negative for shortness of breath.   Cardiovascular: Negative for chest pain.  Gastrointestinal: Positive for nausea. Negative for abdominal pain, anal bleeding, constipation, diarrhea and vomiting.  Genitourinary: Positive for dysuria and frequency. Negative for urgency.  Musculoskeletal: Positive for back pain. Negative for myalgias and neck pain.  Skin: Negative for rash.  Allergic/Immunologic: Negative for immunocompromised state.  Neurological: Negative for dizziness, seizures, weakness, numbness and headaches.   Psychiatric/Behavioral: Negative for confusion.     Physical Exam Updated Vital Signs BP 115/70 (BP Location: Left Arm)   Pulse 100   Temp 98 F (36.7 C) (Oral)   Resp 18   Ht 5' 0.5" (1.537 m)   Wt 61.2 kg   SpO2 100%   BMI 25.93 kg/m   Physical Exam  Constitutional: No distress.  NAD. Non-toxic appearing.  HENT:  Head: Normocephalic.  Eyes: Conjunctivae are normal.  Neck: Neck supple.  Cardiovascular: Normal rate, regular rhythm, normal heart sounds and intact distal pulses. Exam reveals no gallop and no friction rub.  No murmur heard. Pulmonary/Chest: Effort normal. No stridor. No respiratory distress. She has no wheezes. She has no rales. She exhibits no tenderness.  Abdominal: Soft. Bowel sounds are normal. She exhibits no distension and no mass. There is tenderness. There is no rebound and no guarding. No hernia.  Mild tenderness to palpation in the suprapubic region without rebound or guarding.  No CVA tenderness bilaterally.  Musculoskeletal: Normal range of motion. She exhibits no edema, tenderness or deformity.  No tenderness to the cervical, thoracic, lumbar spinous processes or bilateral paraspinal muscles.  No reproducible tenderness to palpation to the musculature of the lumbar spine.  Neurological: She is alert.  Skin: Skin is warm. No rash noted.  Psychiatric: Her behavior is normal.  Nursing note and vitals reviewed.    ED Treatments / Results  Labs (all labs ordered are listed, but only abnormal results are displayed) Labs Reviewed  URINALYSIS, ROUTINE W REFLEX MICROSCOPIC - Abnormal; Notable for the following components:      Result Value   APPearance HAZY (*)    Glucose, UA 150 (*)    Hgb urine dipstick SMALL (*)    Leukocytes, UA LARGE (*)    WBC, UA >50 (*)    Bacteria, UA RARE (*)    All other components within normal limits  URINE CULTURE    EKG None  Radiology No results found.  Procedures Procedures (including critical care  time)  Medications Ordered in ED Medications  acetaminophen (TYLENOL) tablet 1,000 mg (1,000 mg Oral Given 03/27/18 2234)  cephALEXin (KEFLEX) capsule 500 mg (500 mg Oral Given 03/28/18 0017)     Initial Impression / Assessment and Plan / ED Course  I have reviewed the triage vital signs and the nursing notes.  Pertinent labs & imaging results that were available during my care of the patient were reviewed by me and considered in my medical decision making (see chart for details).     27 year old female with history of chlamydia and bacterial vaginosis with 7 months postpartum and presenting with dysuria, urinary frequency, fever, nausea, bilateral low back pain for the last 3 days.  She denies vaginal complaints at this time.  She was febrile to 100.7 on arrival and improved to 98 after Tylenol.  Tachycardia has also improved.  UA with mild hemoglobinuria, pyuria, and positive leukocyte esterase.  First dose of Keflex given in the ED after reviewing prior urine cultures.  Urine culture today has also been sent.  She is well-appearing and in no acute distress.  She is feeling much better after Tylenol and is ready to be discharged home to see her child.  Pt has been diagnosed with a UTI.  Doubt obstructive uropathy, pyelonephritis, or other intra-abdominal pathology.  Pt is afebrile, no CVA tenderness, normotensive, and denies N/V. Pt to be dc home with antibiotics and instructions to follow up with PCP if symptoms persist.  Final Clinical Impressions(s) / ED Diagnoses   Final diagnoses:  Acute cystitis without hematuria    ED Discharge Orders         Ordered    cephALEXin (KEFLEX) 500 MG capsule  2 times daily     03/28/18 0011    ondansetron (ZOFRAN) 4 MG tablet  Every 6 hours     03/28/18 0016           Barkley Boards, PA-C 03/28/18 Kizzie Fantasia, MD 04/01/18 [phone]

## 2018-03-28 NOTE — ED Notes (Addendum)
Pt verbalizes understanding of prescriptions and discharge instructions. Topaz pad is broken

## 2018-03-28 NOTE — Discharge Instructions (Addendum)
Thank you for allowing me to care for you today in the Emergency Department.   Take 1 tablet of Keflex 2 times daily for the next 5 days.  Your first dose was given tonight in the emergency department.  There is a [facility]hour [facility] [address] [place]  To treat your fever, take 600 mg of ibuprofen with food or 650 mg of Tylenol every 6 hours for pain control.  You can also alternate between these 2 medications every 3 hours if your fever returns.  Take 1 tablet of Zofran every 6 hours as needed for nausea or vomiting.  Please follow up with your primary provider for a recheck of your symptoms in 3-4 days.  Return to the Emergency Department if you develop a fever despite taking fever and ibuprofen as directed above, worsening pain, particularly after you have been taking antibiotics for 2-3 days, or if you develop vomiting, or other new, concerning symptoms.

## 2018-03-31 LAB — URINE CULTURE: Culture: >=100000 — AB

## 2018-04-01 ENCOUNTER — Telehealth: Payer: Self-pay | Admitting: *Deleted

## 2018-04-01 NOTE — Telephone Encounter (Signed)
Post ED Visit - Positive Culture Follow-up  Culture report reviewed by antimicrobial stewardship pharmacist:  [x]  Enzo BiNathan Batchelder, Pharm.D. []  Celedonio MiyamotoJeremy Frens, Pharm.D., BCPS AQ-ID []  Garvin FilaMike Maccia, Pharm.D., BCPS []  Georgina PillionElizabeth Martin, Pharm.D., BCPS []  [place]ub[place]h Pham, [place]D., BCPS, AAHIVP []  Estella HuskMichelle Turner, Pharm.D., BCPS, AAHIVP []  Lysle Pearlachel Rumbarger, PharmD, BCPS []  Phillips Climeshuy Dang, PharmD, BCPS []  Agapito GamesAlison Masters, PharmD, BCPS []  Verlan FriendsErin Deja, PharmD  Positive urine culture Treated with Cephalexin, organism sensitive to the same and no further patient follow-up is required at this time.  Virl AxeRobertson, Jamye Balicki [facility] 04/01/2018, 9:36 AM

## 2018-07-09 ENCOUNTER — Other Ambulatory Visit (HOSPITAL_COMMUNITY)
Admission: RE | Admit: 2018-07-09 | Discharge: 2018-07-09 | Disposition: A | Discharge status: Home / Self Care | Payer: Self-pay | Source: Ambulatory Visit | Attending: Nurse Practitioner | Admitting: Nurse Practitioner

## 2018-07-09 DIAGNOSIS — N879 Dysplasia of cervix uteri, unspecified: Secondary | ICD-10-CM | POA: Insufficient documentation

## 2018-07-11 ENCOUNTER — Other Ambulatory Visit: Payer: Self-pay | Admitting: Nurse Practitioner

## 2018-07-31 ENCOUNTER — Telehealth: Payer: Self-pay | Admitting: Obstetrics and Gynecology

## 2018-08-01 NOTE — Telephone Encounter (Signed)
Error

## 2018-08-02 ENCOUNTER — Encounter: Payer: Self-pay | Admitting: Obstetrics & Gynecology

## 2018-09-20 ENCOUNTER — Encounter: Payer: Self-pay | Admitting: *Deleted

## 2018-09-24 ENCOUNTER — Ambulatory Visit: Payer: Self-pay | Admitting: Obstetrics & Gynecology

## 2018-10-08 ENCOUNTER — Emergency Department (HOSPITAL_COMMUNITY): Admission: EM | Admit: 2018-10-08 | Discharge: 2018-10-08 | Discharge status: Absent without leave | Payer: Self-pay

## 2018-10-11 ENCOUNTER — Emergency Department (HOSPITAL_COMMUNITY)
Admission: EM | Admit: 2018-10-11 | Discharge: 2018-10-11 | Disposition: A | Discharge status: Home / Self Care | Payer: No Typology Code available for payment source | Attending: Emergency Medicine | Admitting: Emergency Medicine

## 2018-10-11 ENCOUNTER — Encounter (HOSPITAL_COMMUNITY): Payer: Self-pay | Admitting: Emergency Medicine

## 2018-10-11 ENCOUNTER — Other Ambulatory Visit: Payer: Self-pay

## 2018-10-11 DIAGNOSIS — Z79899 Other long term (current) drug therapy: Secondary | ICD-10-CM | POA: Diagnosis not present

## 2018-10-11 DIAGNOSIS — R0781 Pleurodynia: Secondary | ICD-10-CM | POA: Insufficient documentation

## 2018-10-11 DIAGNOSIS — M542 Cervicalgia: Secondary | ICD-10-CM | POA: Diagnosis not present

## 2018-10-11 DIAGNOSIS — S59912A Unspecified injury of left forearm, initial encounter: Secondary | ICD-10-CM | POA: Diagnosis present

## 2018-10-11 DIAGNOSIS — Z87891 Personal history of nicotine dependence: Secondary | ICD-10-CM | POA: Insufficient documentation

## 2018-10-11 DIAGNOSIS — Y9389 Activity, other specified: Secondary | ICD-10-CM | POA: Insufficient documentation

## 2018-10-11 DIAGNOSIS — S50812A Abrasion of left forearm, initial encounter: Secondary | ICD-10-CM | POA: Insufficient documentation

## 2018-10-11 DIAGNOSIS — Y999 Unspecified external cause status: Secondary | ICD-10-CM | POA: Diagnosis not present

## 2018-10-11 DIAGNOSIS — Y92410 Unspecified street and highway as the place of occurrence of the external cause: Secondary | ICD-10-CM | POA: Insufficient documentation

## 2018-10-11 DIAGNOSIS — N3001 Acute cystitis with hematuria: Secondary | ICD-10-CM | POA: Diagnosis not present

## 2018-10-11 DIAGNOSIS — M549 Dorsalgia, unspecified: Secondary | ICD-10-CM | POA: Diagnosis not present

## 2018-10-11 LAB — URINALYSIS, ROUTINE W REFLEX MICROSCOPIC
Bilirubin Urine: NEGATIVE
Glucose, UA: NEGATIVE mg/dL
Ketones, ur: 80 mg/dL — AB
Nitrite: NEGATIVE
Protein, ur: >=300 mg/dL — AB
RBC / HPF: >50 RBC/hpf — ABNORMAL HIGH (ref 0–5)
Specific Gravity, Urine: 1.026 (ref 1.005–1.030)
Squamous Epithelial / LPF: >50 — ABNORMAL HIGH (ref 0–5)
WBC, UA: >50 WBC/hpf — ABNORMAL HIGH (ref 0–5)
pH: 5 (ref 5.0–8.0)

## 2018-10-11 LAB — PREGNANCY, URINE: Preg Test, Ur: NEGATIVE

## 2018-10-11 MED ORDER — CYCLOBENZAPRINE HCL 10 MG PO TABS: 10.0000 mg | ORAL_TABLET | Freq: Two times a day (BID) | ORAL | 0 refills | Status: DC | PRN

## 2018-10-11 MED ORDER — CEPHALEXIN 500 MG PO CAPS: 500.0000 mg | ORAL_CAPSULE | Freq: Two times a day (BID) | ORAL | 0 refills | Status: AC

## 2018-10-11 MED ORDER — IBUPROFEN 800 MG PO TABS: 800.0000 mg | ORAL_TABLET | Freq: Three times a day (TID) | ORAL | 0 refills | Status: DC
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
## 2018-10-11 NOTE — ED Notes (Signed)
ED Provider at bedside. 
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
## 2018-10-11 NOTE — Discharge Instructions (Addendum)
You were given a prescription for antibiotics. Please take the antibiotic prescription fully.   A culture was sent of your urine today to determine if there is any bacterial growth. If the results of the culture are positive and you require an antibiotic or a change of your prescribed antibiotic you will be contacted by the hospital. If the results are negative you will not be contacted.  You may alternate taking Tylenol and Ibuprofen as needed for pain control. You may take 400-600 mg of ibuprofen every 6 hours and [phone] mg of Tylenol every 6 hours. Do not exceed 4000 mg of Tylenol daily as this can lead to liver damage. Also, make sure to take Ibuprofen with meals as it can cause an upset stomach. Do not take other NSAIDs while taking Ibuprofen such as (Aleve, Naprosyn, Aspirin, Celebrex, etc) and do not take more than the prescribed dose as this can lead to ulcers and bleeding in your GI tract. You may use warm and cold compresses to help with your symptoms.   Flexeril is the muscle relaxer I have prescribed, this is meant to help with muscle tightness. Be aware that this medication may make you drowsy therefore the first time you take this it should be at a time you are in an environment where you can rest. Do not drive or operate heavy machinery when taking this medication.   Please follow up with your primary doctor within the next 7-10 days for re-evaluation and further treatment of your symptoms.   Please return to the ER sooner if you have any new or worsening symptoms.

## 2018-10-13 LAB — URINE CULTURE: Culture: >=100000 — AB

## 2018-10-14 ENCOUNTER — Telehealth: Payer: Self-pay | Admitting: Emergency Medicine

## 2018-10-14 NOTE — Telephone Encounter (Signed)
Post ED Visit - Positive Culture Follow-up  Culture report reviewed by antimicrobial stewardship pharmacist: [facility] Team []  Elenor Quinones, Pharm.D. []  Heide Guile, Pharm.D., BCPS AQ-ID []  Parks Neptune, Pharm.D., BCPS []  Alycia Rossetti, Pharm.D., BCPS []  Jeannette, [place].D., BCPS, AAHIVP []  Legrand Como, Pharm.D., BCPS, AAHIVP [x]  Salome Arnt, PharmD, BCPS []  Johnnette Gourd, PharmD, BCPS []  Hughes Better, PharmD, BCPS []  Leeroy Cha, PharmD []  Laqueta Linden, PharmD, BCPS []  Albertina Parr, PharmD  Campus Team []  Leodis Sias, PharmD []  Lindell Spar, PharmD []  Royetta Asal, PharmD []  Graylin Shiver, Rph []  Rema Fendt) Glennon Mac, PharmD []  Arlyn Dunning, PharmD []  Netta Cedars, PharmD []  Dia Sitter, PharmD []  Leone Haven, PharmD []  Gretta Arab, PharmD []  Theodis Shove, PharmD []  Peggyann Juba, PharmD []  Reuel Boom, PharmD   Positive urine culture Treated with cephalexin, organism sensitive to the same and no further patient follow-up is required at this time.  Hazle Nordmann 10/14/2018, 10:05 AM
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
## 2019-04-07 ENCOUNTER — Other Ambulatory Visit: Payer: Self-pay

## 2019-04-07 DIAGNOSIS — Z20822 Contact with and (suspected) exposure to covid-19: Secondary | ICD-10-CM

## 2019-04-09 LAB — NOVEL CORONAVIRUS, NAA: SARS-CoV-2, NAA: NOT DETECTED

## 2019-04-10 ENCOUNTER — Telehealth: Payer: Self-pay | Admitting: General Practice

## 2019-04-10 NOTE — Telephone Encounter (Signed)
Negative COVID results given. Patient results "NOT Detected." Caller expressed understanding. ° °

## 2019-06-04 ENCOUNTER — Telehealth (HOSPITAL_COMMUNITY): Payer: Self-pay

## 2019-06-04 NOTE — Telephone Encounter (Signed)
I called patient to schedule [facility] appointment, scheduled for 06/05/19 @ 3:30 pm. Previous Pap smear report received. Patient understands, accepted appointment, was given directions, informed to bring last tax return, social security card, and ID to discuss medicaid eligibility.

## 2019-06-05 ENCOUNTER — Ambulatory Visit (HOSPITAL_COMMUNITY): Payer: Self-pay

## 2019-06-05 ENCOUNTER — Telehealth (HOSPITAL_COMMUNITY): Payer: Self-pay

## 2019-06-05 NOTE — Telephone Encounter (Signed)
Patient called to reschedule [facility] appointment. Stated that she would not be able to come today. Rescheduled for 06/10/19. Informed patient that it was very important for her to keep this appointment. Patient voiced understanding.

## 2019-06-10 ENCOUNTER — Ambulatory Visit (HOSPITAL_COMMUNITY)
Admission: RE | Admit: 2019-06-10 | Discharge: 2019-06-10 | Disposition: A | Discharge status: Home / Self Care | Payer: Self-pay | Source: Ambulatory Visit | Attending: Obstetrics and Gynecology | Admitting: Obstetrics and Gynecology

## 2019-06-10 ENCOUNTER — Other Ambulatory Visit: Payer: Self-pay

## 2019-06-10 ENCOUNTER — Encounter (HOSPITAL_COMMUNITY): Payer: Self-pay

## 2019-06-10 DIAGNOSIS — Z01419 Encounter for gynecological examination (general) (routine) without abnormal findings: Secondary | ICD-10-CM

## 2019-06-10 DIAGNOSIS — N898 Other specified noninflammatory disorders of vagina: Secondary | ICD-10-CM | POA: Insufficient documentation

## 2019-06-10 NOTE — Progress Notes (Signed)
Patient referred to BCCCP by the [facility] Department due to having an abnormal Pap smear and colposcopy one year ago that no follow-up was completed.  Pap Smear: Pap smear completed today. Last Pap smear was 11/13/2017 at [facility] Department and HSIL with positive HPV. 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 Per patient has a history of 3-4 other abnormal Pap smears that no follow-up was completed. Last Pap smear and colposcopy result will be scanned into Epic.  Physical exam: Breasts Breasts symmetrical. No skin abnormalities bilateral breasts. No nipple retraction bilateral breasts. No nipple discharge bilateral breasts. No lymphadenopathy. No lumps palpated bilateral breasts. No complaints of pain or tenderness on exam. Screening mammogram recommended at age 28 unless clinically indicated prior.    Pelvic/Bimanual   Ext Genitalia No lesions, no swelling and no discharge observed on external genitalia.         Vagina Vagina pink and normal texture. No lesions and yellowish colored frothy discharge observed in vagina. Wet prep completed.          Cervix Cervix is present. Cervix pink and of normal texture. Cervix friable. No discharge observed on cervical os.    Uterus Uterus is present and palpable. Uterus in normal position and normal size.        Adnexae Bilateral ovaries present and palpable. No tenderness on palpation.         Rectovaginal No rectal exam completed today since patient had no rectal complaints. No skin abnormalities observed on exam.    Smoking History: Patient has never smoked.  Patient Navigation: Patient education provided. Access to services provided for patient through BCCCP program.   Breast and Cervical Cancer Risk Assessment: Patient has no family history of breast cancer, known genetic mutations, or radiation treatment to the chest before age 28. Patient has  a history of cervical dysplasia. Patient has no history of being immunocompromised or DES exposure in-utero. Breast Cancer risk assessment completed. No breast cancer risk calculated due to patient is less than 28 years old.

## 2019-06-10 NOTE — Patient Instructions (Signed)
Explained breast self awareness with Gennaro Africa. Let patient know that follow-up for today's Pap smear will be based on the result. Informed patient that a screening mammogram is recommended at age 28 unless clinically indicated prior. Let patient know will follow up with her within the next couple weeks with results of Pap smear and wet prep by phone. Gennaro Africa verbalized understanding.  Anicka Stuckert, Kathaleen Maser, RN 3:53 PM

## 2019-06-12 LAB — CERVICOVAGINAL ANCILLARY ONLY
Bacterial Vaginitis (gardnerella): POSITIVE — AB
Candida Glabrata: NEGATIVE
Candida Vaginitis: NEGATIVE
Comment: NEGATIVE
Comment: NEGATIVE
Comment: NEGATIVE
Comment: NEGATIVE
Trichomonas: NEGATIVE

## 2019-06-12 LAB — CYTOLOGY - PAP
Comment: NEGATIVE
Diagnosis: HIGH — AB
High risk HPV: POSITIVE — AB

## 2019-06-12 NOTE — Progress Notes (Signed)
        Needs colpo

## 2019-06-13 ENCOUNTER — Other Ambulatory Visit: Payer: Self-pay

## 2019-06-13 ENCOUNTER — Telehealth: Payer: Self-pay

## 2019-06-13 MED ORDER — METRONIDAZOLE 500 MG PO TABS: 500.0000 mg | ORAL_TABLET | Freq: Two times a day (BID) | ORAL | 0 refills | Status: DC

## 2019-06-13 NOTE — Telephone Encounter (Signed)
Patient was informed abnormal pap results-HSIL/Positive HPV,  Positive Bacterial Vaginosis needs Colposcopy. Colposcopy appointment is 07/17/19 @ 10:55am at the Center for [facility]. Patient states the appointment date/time did not work for her, needs to reschedule. Patient was given phone number to Center for [facility] to call and reschedule appointment. Rx (Flagyl) sent to [facility]. Patient understands.

## 2019-06-13 NOTE — Progress Notes (Signed)
Hi,    I informed patient of pap results and Colpo appt. The appointment date did not work for patient. I gave her the number to call and reschedule.  Thanks, [employer]

## 2019-06-16 ENCOUNTER — Telehealth: Payer: Self-pay

## 2019-06-16 NOTE — Telephone Encounter (Signed)
Per Wynona Canes, RN-I called patient to remind her to call back to Center for [facility] to reschedule her Colposcopy. Patient understands, states she will call today, the previous appointment was scheduled on her daughter's birthday, given telephone number to Center for [employer].

## 2019-07-03 ENCOUNTER — Telehealth: Payer: Self-pay

## 2019-07-03 NOTE — Telephone Encounter (Signed)
Returned patient's phone call requesting [facility] telephone number. Telephoned home number, mailbox full.

## 2019-09-03 ENCOUNTER — Ambulatory Visit (INDEPENDENT_AMBULATORY_CARE_PROVIDER_SITE_OTHER): Payer: Self-pay | Admitting: Obstetrics and Gynecology

## 2019-09-03 ENCOUNTER — Encounter: Payer: Self-pay | Admitting: Obstetrics and Gynecology

## 2019-09-03 ENCOUNTER — Other Ambulatory Visit (HOSPITAL_COMMUNITY)
Admission: RE | Admit: 2019-09-03 | Discharge: 2019-09-03 | Disposition: A | Discharge status: Home / Self Care | Payer: Medicaid Other | Source: Ambulatory Visit | Attending: Obstetrics and Gynecology | Admitting: Obstetrics and Gynecology

## 2019-09-03 ENCOUNTER — Other Ambulatory Visit: Payer: Self-pay

## 2019-09-03 VITALS — BP 106/71 | HR 75 | Wt 145.2 lb

## 2019-09-03 DIAGNOSIS — Z3041 Encounter for surveillance of contraceptive pills: Secondary | ICD-10-CM

## 2019-09-03 DIAGNOSIS — Z3202 Encounter for pregnancy test, result negative: Secondary | ICD-10-CM

## 2019-09-03 DIAGNOSIS — N879 Dysplasia of cervix uteri, unspecified: Secondary | ICD-10-CM

## 2019-09-03 HISTORY — PX: COLPOSCOPY: PRO47

## 2019-09-03 LAB — POCT PREGNANCY, URINE: Preg Test, Ur: NEGATIVE

## 2019-09-03 MED ORDER — NORGESTIMATE-ETH ESTRADIOL 0.25-35 MG-MCG PO TABS: 1.0000 | ORAL_TABLET | Freq: Every day | ORAL | 3 refills | Status: DC

## 2019-09-03 NOTE — Procedures (Signed)
  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    The risks (including infection, bleeding, pain) and benefits of the procedure were explained to the patient and written informed consent was obtained.  The patient was placed in the dorsal lithotomy position. A Graves was speculum inserted in the vagina, and the cervix was visualized.  AA staining done Lugol's with green filter.  Biopsy (random) from 10, 2 and 6 o'clock and then single toothed tenaculum applied and ECC in all four quadrants done. No bleeding after procedure with application of Monsel's  Findings: very friable transformation zone, diffuse AWE changes with increased vascularity and mosaicism diffusely.  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 I d/w her potential risk of PTB, SAB, PTL with LEEP although risk is minimal with just one procedure. She is wondering if anything else could be done and I told her potential cryo but based on her history I recommend an excisional procedure.  The patient was advised to call for any fever or for prolonged or severe pain or bleeding. She was advised to use OTC analgesics as needed for mild to moderate pain. She was advised to avoid vaginal intercourse for 2 weeks.  Patient needs OCP refill. She doesn't have insurance and has no issues with OCPs and no contraindications. Sprintec sent in for her.   RTC: 1 month for LEEP appt tentatively made Cornelia Copa MD Attending Center for [facility])

## 2019-09-05 LAB — SURGICAL PATHOLOGY

## 2019-10-22 ENCOUNTER — Other Ambulatory Visit (HOSPITAL_COMMUNITY)
Admission: RE | Admit: 2019-10-22 | Discharge: 2019-10-22 | Disposition: A | Discharge status: Home / Self Care | Payer: Medicaid Other | Source: Ambulatory Visit | Attending: Obstetrics and Gynecology | Admitting: Obstetrics and Gynecology

## 2019-10-22 ENCOUNTER — Other Ambulatory Visit: Payer: Self-pay

## 2019-10-22 ENCOUNTER — Ambulatory Visit (INDEPENDENT_AMBULATORY_CARE_PROVIDER_SITE_OTHER): Payer: Self-pay | Admitting: Obstetrics and Gynecology

## 2019-10-22 ENCOUNTER — Encounter: Payer: Self-pay | Admitting: Obstetrics and Gynecology

## 2019-10-22 VITALS — BP 112/71 | HR 84 | Ht 65.0 in | Wt 140.0 lb

## 2019-10-22 DIAGNOSIS — N879 Dysplasia of cervix uteri, unspecified: Secondary | ICD-10-CM

## 2019-10-22 DIAGNOSIS — D069 Carcinoma in situ of cervix, unspecified: Secondary | ICD-10-CM | POA: Insufficient documentation

## 2019-10-22 DIAGNOSIS — D060 Carcinoma in situ of endocervix: Secondary | ICD-10-CM

## 2019-10-22 DIAGNOSIS — Z3202 Encounter for pregnancy test, result negative: Secondary | ICD-10-CM

## 2019-10-22 HISTORY — PX: LEEP: PRO1013

## 2019-10-22 LAB — POCT PREGNANCY, URINE: Preg Test, Ur: NEGATIVE

## 2019-10-22 MED ORDER — METRONIDAZOLE 500 MG PO TABS: 500.0000 mg | ORAL_TABLET | Freq: Two times a day (BID) | ORAL | 0 refills | Status: AC

## 2019-10-22 NOTE — Procedures (Signed)
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  The patient was placed in the dorsal lithotomy position. A coated Graves was speculum inserted in the vagina, and the cervix was visualized.  A colposcopy was performed with acetic acid and lugol's staining, with the below noted findings. The cervical stromal bed was injected with 47mL of 1% lidocaine with epinephrine. Entering at 12 o'clock on the cervix and using a large, shallow Fischer Loop and a setting of 50/50 blend current, a LEEP was done, in one circumferential fashion.  Next, an ECC was done and hemostasis achieved with the ball electrode on 50 coagulation current and application of Monsel's. On coagulation, the entire LEEP bed and surgical margins were thoroughly cauterized  Findings: unchanged from prior  Adequate: yes  Specimens: LEEP specimen (circumferential, 360 degrees) and ECC   Condition: Stable  Complications: None  Plan: The patient was advised to call for any fever or for prolonged or severe pain or bleeding. She was advised to use OTC analgesics as needed for mild to moderate pain. Pelvic rest was advised until after her four week post operative visit.    Cornelia Copa MD Attending Center for [employer] [facility])

## 2019-10-24 LAB — SURGICAL PATHOLOGY

## 2019-10-27 ENCOUNTER — Telehealth: Payer: Self-pay | Admitting: Obstetrics and Gynecology

## 2019-10-27 NOTE — Telephone Encounter (Signed)
GYN Telephone Note Results d/w pt and I recommend rpt colpo and ecc in 6 months. Pt amenable to plan. Follow up 52m for post leep check  Cornelia Copa MD Attending Center for [facility] (Faculty Practice) 10/27/2019 Time: 1037am

## 2019-11-27 ENCOUNTER — Other Ambulatory Visit: Payer: Self-pay

## 2019-11-27 ENCOUNTER — Encounter: Payer: Self-pay | Admitting: Obstetrics and Gynecology

## 2019-11-27 ENCOUNTER — Ambulatory Visit (INDEPENDENT_AMBULATORY_CARE_PROVIDER_SITE_OTHER): Payer: Self-pay | Admitting: Obstetrics and Gynecology

## 2019-11-27 VITALS — BP 114/71 | HR 88 | Ht 65.5 in | Wt 144.3 lb

## 2019-11-27 DIAGNOSIS — B3731 Acute candidiasis of vulva and vagina: Secondary | ICD-10-CM

## 2019-11-27 DIAGNOSIS — B373 Candidiasis of vulva and vagina: Secondary | ICD-10-CM

## 2019-11-27 MED ORDER — FLUCONAZOLE 150 MG PO TABS: 150.0000 mg | ORAL_TABLET | Freq: Once | ORAL | 0 refills | Status: AC

## 2019-11-27 MED ORDER — MICONAZOLE NITRATE 2 % VA CREA: 1.0000 | TOPICAL_CREAM | Freq: Every day | VAGINAL | 0 refills | Status: DC

## 2019-11-27 NOTE — Progress Notes (Signed)
Obstetrics and Gynecology Visit Return Patient Evaluation  Appointment Date: 11/27/2019  Primary Care Provider: [facility]: [facility]  Chief Complaint: follow up LEEP  History of Present Illness:  Jennifer [name] is a 29 y.o. 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  Interval History: Since that time, she states that she is doing well. Period is just ending.   Review of Systems: as noted in the History of Present Illness.  Medications:  Jennifer [name] had no medications administered during this visit. Current Outpatient Medications  Medication Sig Dispense Refill  . norgestimate-ethinyl estradiol (ORTHO-CYCLEN) 0.25-35 MG-MCG tablet Take 1 tablet by mouth daily. 3 Package 3  . fluconazole (DIFLUCAN) 150 MG tablet Take 1 tablet (150 mg total) by mouth once for 1 dose. Can take additional dose three days later if symptoms persist 2 tablet 0  . ibuprofen (ADVIL) 800 MG tablet Take 1 tablet (800 mg total) by mouth 3 (three) times daily. (Patient not taking: Reported on 09/03/2019) 21 tablet 0  . miconazole (MONISTAT 7) 2 % vaginal cream Place 1 Applicatorful vaginally at bedtime. Apply for seven nights 30 g 0   No current facility-administered medications for this visit.    Allergies: has No Known Allergies.  Physical Exam:  BP 114/71   Pulse 88   Ht 5' 5.5" (1.664 m)   Wt 144 lb 4.8 oz (65.5 kg)   LMP 11/22/2019 (Exact Date)   BMI 23.65 kg/m  Body mass index is 23.65 kg/m. General appearance: Well nourished, well developed female in no acute distress.  Abdomen: diffusely non tender to palpation, non distended, and no masses, hernias Neuro/Psych:  Normal mood and affect.    Pelvic exam:  EGBUS, vaginal vault and cervix: within normal limits. Cervix well healed, can't even tell she's had a  LEEP   Assessment: pt doing well  Plan:  1. Follow up LEEP Plan for rpt colpo and ecc in 6 months. Pt amenable to plan  2. Vulvovaginal candidiasis Diflucan sent in   RTC: 62m colpo and ecc  Cornelia Copa MD Attending Center for [facility])

## 2020-01-02 ENCOUNTER — Encounter: Payer: Self-pay | Admitting: General Practice

## 2020-01-27 ENCOUNTER — Other Ambulatory Visit: Payer: Self-pay

## 2020-01-27 ENCOUNTER — Ambulatory Visit (INDEPENDENT_AMBULATORY_CARE_PROVIDER_SITE_OTHER): Payer: PRIVATE HEALTH INSURANCE | Admitting: Nurse Practitioner

## 2020-01-27 ENCOUNTER — Encounter: Payer: Self-pay | Admitting: Nurse Practitioner

## 2020-01-27 VITALS — BP 120/70 | Ht 65.5 in | Wt 155.0 lb

## 2020-01-27 DIAGNOSIS — B9689 Other specified bacterial agents as the cause of diseases classified elsewhere: Secondary | ICD-10-CM | POA: Diagnosis not present

## 2020-01-27 DIAGNOSIS — Z113 Encounter for screening for infections with a predominantly sexual mode of transmission: Secondary | ICD-10-CM

## 2020-01-27 DIAGNOSIS — N898 Other specified noninflammatory disorders of vagina: Secondary | ICD-10-CM | POA: Diagnosis not present

## 2020-01-27 DIAGNOSIS — N760 Acute vaginitis: Secondary | ICD-10-CM

## 2020-01-27 DIAGNOSIS — R300 Dysuria: Secondary | ICD-10-CM

## 2020-01-27 LAB — WET PREP FOR TRICH, YEAST, CLUE

## 2020-01-27 MED ORDER — METRONIDAZOLE 0.75 % VA GEL: 1.0000 | VAGINAL | 0 refills | Status: DC

## 2020-01-27 MED ORDER — METRONIDAZOLE 500 MG PO TABS: 500.0000 mg | ORAL_TABLET | Freq: Two times a day (BID) | ORAL | 0 refills | Status: DC

## 2020-01-27 NOTE — Progress Notes (Addendum)
   Acute Office Visit  Subjective:    Patient ID: Jennifer [name], female    DOB: 1990/11/02, 29 y.o.   MRN: [id number]   HPI 29 y.o. Jennifer [name] presents as new patient for vaginal odor, discharge, and burning with urination. 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 Says she has been treated with Flagyl and it returns each time. Would also like STD testing today. No new sexual partners.    Review of Systems  Constitutional: Negative.   Genitourinary: Positive for dysuria and vaginal discharge. Negative for frequency and urgency.       Vaginal odor       Objective:    Physical Exam Constitutional:      Appearance: Normal appearance.  Genitourinary:    General: Normal vulva.     Vagina: Vaginal discharge (copious white) present. No erythema.     BP 120/70   Ht 5' 5.5" (1.664 m)   Wt 155 lb (70.3 kg)   LMP 01/06/2020   BMI 25.40 kg/m  Wt Readings from Last 3 Encounters:  01/27/20 155 lb (70.3 kg)  11/27/19 144 lb 4.8 oz (65.5 kg)  10/22/19 140 lb (63.5 kg)   Wet prep + clue cells     Assessment & Plan:   Problem List Items Addressed This Visit    None    Visit Diagnoses    Bacterial vaginosis    -  Primary   Relevant Medications   metroNIDAZOLE (FLAGYL) 500 MG tablet   metroNIDAZOLE (METROGEL) 0.75 % vaginal gel (Start on 02/04/2020)   Vaginal odor       Relevant Orders   WET PREP FOR TRICH, YEAST, CLUE   Screen for STD (sexually transmitted disease)       Relevant Orders   C. trachomatis/N. gonorrhoeae RNA   HIV Antibody (routine testing w rflx)   RPR   Burning with urination       Relevant Orders   Urinalysis w microscopic + reflex cultur     Plan: Wet prep and exam consistent with bacterial vaginosis. Due to recurrences we will do Flagyl PO 500 mg twice a day for 7 days and when this is completed she will do Metrogel 0.75% twice weekly for 4 months with instructions to complete full course of antibiotics and avoid alcohol with pills. UA pending.  Gonorrhea/Chlamyida, HIV, RPR today. She is agreeable to plan.      Olivia Mackie [facility], 12:49 PM 01/27/2020

## 2020-01-27 NOTE — Patient Instructions (Signed)

## 2020-01-28 LAB — RPR: RPR Ser Ql: NONREACTIVE

## 2020-01-28 LAB — C. TRACHOMATIS/N. GONORRHOEAE RNA
C. trachomatis RNA, TMA: NOT DETECTED
N. gonorrhoeae RNA, TMA: NOT DETECTED

## 2020-01-28 LAB — HIV ANTIBODY (ROUTINE TESTING W REFLEX): HIV 1&2 Ab, 4th Generation: NONREACTIVE

## 2020-01-30 ENCOUNTER — Telehealth: Payer: Self-pay | Admitting: *Deleted

## 2020-01-30 LAB — URINALYSIS W MICROSCOPIC + REFLEX CULTURE
Bilirubin Urine: NEGATIVE
Glucose, UA: NEGATIVE
Hgb urine dipstick: NEGATIVE
Ketones, ur: NEGATIVE
Nitrites, Initial: POSITIVE — AB
Protein, ur: NEGATIVE
RBC / HPF: NONE SEEN /HPF (ref 0–2)
Specific Gravity, Urine: 1.017 (ref 1.001–1.03)
Squamous Epithelial / LPF: NONE SEEN /HPF (ref <=5)
pH: 7 (ref 5.0–8.0)

## 2020-01-30 LAB — CULTURE INDICATED

## 2020-01-30 LAB — URINE CULTURE
MICRO NUMBER:: 11005995
SPECIMEN QUALITY:: ADEQUATE

## 2020-01-30 MED ORDER — METRONIDAZOLE 0.75 % VA GEL: 1.0000 | Freq: Every day | VAGINAL | 0 refills | Status: DC

## 2020-01-30 MED ORDER — NITROFURANTOIN MONOHYD MACRO 100 MG PO CAPS: 100.0000 mg | ORAL_CAPSULE | Freq: Two times a day (BID) | ORAL | 0 refills | Status: DC

## 2020-01-30 NOTE — Telephone Encounter (Signed)
You are back up MD) Patient saw her urine results and would like to get started on Rx for UTI from recent office visit. She also mentioned that the flagyl 500 mg tablet is causing nausea back pain and fever. Please advise

## 2020-01-30 NOTE — Telephone Encounter (Signed)
Left detailed message on cell with below both Rx's sent.

## 2020-01-30 NOTE — Telephone Encounter (Signed)
Please change the Flagyl to MetroGel 0.75% nightly for 5 nights. Treat the UTI with Macrobid 100 mg twice daily x7 days, finish all the medication.  I would recommend doing both the MetroGel and the oral antibiotic at the same time.

## 2020-03-01 ENCOUNTER — Ambulatory Visit (INDEPENDENT_AMBULATORY_CARE_PROVIDER_SITE_OTHER): Payer: Medicaid Other | Admitting: Nurse Practitioner

## 2020-03-01 ENCOUNTER — Telehealth: Payer: Self-pay | Admitting: *Deleted

## 2020-03-01 ENCOUNTER — Encounter: Payer: Self-pay | Admitting: Nurse Practitioner

## 2020-03-01 ENCOUNTER — Other Ambulatory Visit: Payer: Self-pay

## 2020-03-01 VITALS — BP 116/72

## 2020-03-01 DIAGNOSIS — R300 Dysuria: Secondary | ICD-10-CM | POA: Diagnosis not present

## 2020-03-01 DIAGNOSIS — N3001 Acute cystitis with hematuria: Secondary | ICD-10-CM

## 2020-03-01 MED ORDER — NITROFURANTOIN MONOHYD MACRO 100 MG PO CAPS: 100.0000 mg | ORAL_CAPSULE | Freq: Two times a day (BID) | ORAL | 0 refills | Status: DC

## 2020-03-01 NOTE — Progress Notes (Signed)
   Acute Office Visit  Subjective:    Patient ID: Jennifer [name], female    DOB: [birth year], 29 y.o.   MRN: [id number]   HPI 29 y.o. presents today for dysuria, right flank pain, nausea and chills.  Was treated with Bactrim for E. coli UTI 01/27/2020.  Says symptoms improved for a week but symptoms slowly started back.  Symptoms were tolerable until 5 days ago when she began having the nausea and chills.  Denies fever.  Says she has had 5 UTIs in the past year.  Not sexually active since June. Denies vaginal symptoms.    Review of Systems  Constitutional: Positive for chills. Negative for diaphoresis and fever.  Gastrointestinal: Positive for nausea.  Genitourinary: Positive for dysuria, frequency and urgency.  Musculoskeletal: Positive for back pain.       Objective:    Physical Exam Constitutional:      Appearance: Normal appearance.  Abdominal:     Tenderness: There is no right CVA tenderness or left CVA tenderness.     BP 116/72   LMP 03/01/2020 Comment: PILL Wt Readings from Last 3 Encounters:  01/27/20 155 lb (70.3 kg)  11/27/19 144 lb 4.8 oz (65.5 kg)  10/22/19 140 lb (63.5 kg)   UA: 1+ leukocyte, nitrite positive, 1+ blood, WBC 20-40, bacteria many     Assessment & Plan:   Problem List Items Addressed This Visit    None    Visit Diagnoses    Acute cystitis with hematuria    -  Primary   Relevant Medications   nitrofurantoin, macrocrystal-monohydrate, (MACROBID) 100 MG capsule   Dysuria       Relevant Orders   Urinalysis,Complete w/RFL Culture     Plan: We will treat with Macrobid 100 mg twice daily for 7 days.  Urine culture pending.  Instructed to call if symptoms return after completing antibiotic.  If she continues to have UTIs we will refer to urology.  Increase fluid intake and discussed good vaginal hygiene.  If she begins to experience fever, malaise, and worsening flank pain it is recommended she go to the emergency room.  She is agreeable to  plan.     Olivia Mackie [facility], 4:21 PM 03/01/2020

## 2020-03-01 NOTE — Telephone Encounter (Signed)
Patient called c/o burning with urination, lower back pain and chills,no fever was treated on 01/30/20 telephone encounter with Macrobid 100 mg 1 po bid x 7 days. Patient said she took medication and felt better, however 3 days she started to have symptoms again. Patient asked if Rx could be sent into pharmacy? Please advise

## 2020-03-01 NOTE — Telephone Encounter (Signed)
Her urine culture at that time showed E. Coli. She will need to return for an office visit to obtain a UA for culture.

## 2020-03-01 NOTE — Telephone Encounter (Signed)
Patient informed. 

## 2020-03-01 NOTE — Patient Instructions (Signed)
Urinary Tract Infection, Adult A urinary tract infection (UTI) is an infection of any part of the urinary tract. The urinary tract includes:  The kidneys.  The ureters.  The bladder.  The urethra. These organs make, store, and get rid of pee (urine) in the body. What are the causes? This is caused by germs (bacteria) in your genital area. These germs grow and cause swelling (inflammation) of your urinary tract. What increases the risk? You are more likely to develop this condition if:  You have a small, thin tube (catheter) to drain pee.  You cannot control when you pee or poop (incontinence).  You are female, and: ? You use these methods to prevent pregnancy:  A medicine that kills sperm (spermicide).  A device that blocks sperm (diaphragm). ? You have low levels of a female hormone (estrogen). ? You are pregnant.  You have genes that add to your risk.  You are sexually active.  You take antibiotic medicines.  You have trouble peeing because of: ? A prostate that is bigger than normal, if you are female. ? A blockage in the part of your body that drains pee from the bladder (urethra). ? A kidney stone. ? A nerve condition that affects your bladder (neurogenic bladder). ? Not getting enough to drink. ? Not peeing often enough.  You have other conditions, such as: ? Diabetes. ? A weak disease-fighting system (immune system). ? Sickle cell disease. ? Gout. ? Injury of the spine. What are the signs or symptoms? Symptoms of this condition include:  Needing to pee right away (urgently).  Peeing often.  Peeing small amounts often.  Pain or burning when peeing.  Blood in the pee.  Pee that smells bad or not like normal.  Trouble peeing.  Pee that is cloudy.  Fluid coming from the vagina, if you are female.  Pain in the belly or lower back. Other symptoms include:  Throwing up (vomiting).  No urge to eat.  Feeling mixed up (confused).  Being tired  and grouchy (irritable).  A fever.  Watery poop (diarrhea). How is this treated? This condition may be treated with:  Antibiotic medicine.  Other medicines.  Drinking enough water. Follow these instructions at home:  Medicines  Take over-the-counter and prescription medicines only as told by your doctor.  If you were prescribed an antibiotic medicine, take it as told by your doctor. Do not stop taking it even if you start to feel better. General instructions  Make sure you: ? Pee until your bladder is empty. ? Do not hold pee for a long time. ? Empty your bladder after sex. ? Wipe from front to back after pooping if you are a female. Use each tissue one time when you wipe.  Drink enough fluid to keep your pee pale yellow.  Keep all follow-up visits as told by your doctor. This is important. Contact a doctor if:  You do not get better after 1-2 days.  Your symptoms go away and then come back. Get help right away if:  You have very bad back pain.  You have very bad pain in your lower belly.  You have a fever.  You are sick to your stomach (nauseous).  You are throwing up. Summary  A urinary tract infection (UTI) is an infection of any part of the urinary tract.  This condition is caused by germs in your genital area.  There are many risk factors for a UTI. These include having a small, thin   tube to drain pee and not being able to control when you pee or poop.  Treatment includes antibiotic medicines for germs.  Drink enough fluid to keep your pee pale yellow. This information is not intended to replace advice given to you by your health care provider. Make sure you discuss any questions you have with your health care provider. Document Revised: 04/04/2018 Document Reviewed: 10/25/2017 Elsevier Patient Education  2020 Elsevier Inc.  

## 2020-03-04 ENCOUNTER — Other Ambulatory Visit: Payer: Self-pay | Admitting: Nurse Practitioner

## 2020-03-04 DIAGNOSIS — N390 Urinary tract infection, site not specified: Secondary | ICD-10-CM

## 2020-03-04 DIAGNOSIS — B9620 Unspecified Escherichia coli [E. coli] as the cause of diseases classified elsewhere: Secondary | ICD-10-CM

## 2020-03-04 LAB — URINALYSIS, COMPLETE W/RFL CULTURE: Hyaline Cast: NONE SEEN /LPF

## 2020-03-04 LAB — URINE CULTURE
MICRO NUMBER:: 11142878
SPECIMEN QUALITY:: ADEQUATE

## 2020-03-04 LAB — CULTURE INDICATED

## 2020-03-04 MED ORDER — SULFAMETHOXAZOLE-TRIMETHOPRIM 800-160 MG PO TABS: 1.0000 | ORAL_TABLET | Freq: Two times a day (BID) | ORAL | 0 refills | Status: AC

## 2020-06-16 ENCOUNTER — Telehealth: Payer: Self-pay

## 2020-06-16 NOTE — Telephone Encounter (Signed)
Left message for patient about scheduling appointment with the BCCCP program. Left name and number for patient to call back. °

## 2020-06-17 ENCOUNTER — Telehealth: Payer: Self-pay | Admitting: Lactation Services

## 2020-06-17 DIAGNOSIS — Z3041 Encounter for surveillance of contraceptive pills: Secondary | ICD-10-CM

## 2020-06-17 MED ORDER — NORGESTIMATE-ETH ESTRADIOL 0.25-35 MG-MCG PO TABS: 1.0000 | ORAL_TABLET | Freq: Every day | ORAL | 0 refills | Status: DC

## 2020-06-17 NOTE — Addendum Note (Signed)
Addended by: Marjo Bicker on: 06/17/2020 04:37 PM   Modules accepted: Orders

## 2020-06-17 NOTE — Telephone Encounter (Signed)
Concern addressed via MyChart message. 3 month supply of birth control pills sent to pharmacy.

## 2020-06-17 NOTE — Telephone Encounter (Signed)
Patient called and LM on nurse voicemail that she needs a refill on her Birth Control as they will run out prior to her appointment next month.

## 2020-06-21 ENCOUNTER — Telehealth: Payer: Self-pay | Admitting: Obstetrics and Gynecology

## 2020-06-21 NOTE — Telephone Encounter (Signed)
Dr. Vergie Living recommended repeat colpo I LVM with pt to get appt scheduled

## 2020-07-08 ENCOUNTER — Ambulatory Visit: Payer: Self-pay

## 2020-10-19 ENCOUNTER — Ambulatory Visit: Payer: Self-pay | Admitting: Obstetrics & Gynecology

## 2020-10-19 ENCOUNTER — Ambulatory Visit: Payer: Self-pay | Admitting: *Deleted

## 2020-10-19 ENCOUNTER — Ambulatory Visit (INDEPENDENT_AMBULATORY_CARE_PROVIDER_SITE_OTHER): Payer: Self-pay | Admitting: Obstetrics & Gynecology

## 2020-10-19 ENCOUNTER — Other Ambulatory Visit (HOSPITAL_COMMUNITY)
Admission: RE | Admit: 2020-10-19 | Discharge: 2020-10-19 | Disposition: A | Discharge status: Home / Self Care | Payer: Medicaid Other | Source: Ambulatory Visit | Attending: Obstetrics & Gynecology | Admitting: Obstetrics & Gynecology

## 2020-10-19 ENCOUNTER — Other Ambulatory Visit: Payer: Self-pay

## 2020-10-19 ENCOUNTER — Encounter: Payer: Self-pay | Admitting: Obstetrics & Gynecology

## 2020-10-19 VITALS — BP 116/80 | Wt 159.4 lb

## 2020-10-19 VITALS — BP 123/66 | HR 83 | Ht 65.5 in | Wt 159.0 lb

## 2020-10-19 DIAGNOSIS — Z1239 Encounter for other screening for malignant neoplasm of breast: Secondary | ICD-10-CM

## 2020-10-19 DIAGNOSIS — N879 Dysplasia of cervix uteri, unspecified: Secondary | ICD-10-CM | POA: Diagnosis present

## 2020-10-19 DIAGNOSIS — N3000 Acute cystitis without hematuria: Secondary | ICD-10-CM

## 2020-10-19 LAB — POCT URINALYSIS DIP (DEVICE)
Bilirubin Urine: NEGATIVE
Glucose, UA: NEGATIVE mg/dL
Ketones, ur: NEGATIVE mg/dL
Nitrite: POSITIVE — AB
Protein, ur: NEGATIVE mg/dL
Specific Gravity, Urine: >=1.03 (ref 1.005–1.030)
Urobilinogen, UA: 0.2 mg/dL (ref 0.0–1.0)
pH: 6 (ref 5.0–8.0)

## 2020-10-19 MED ORDER — SULFAMETHOXAZOLE-TRIMETHOPRIM 800-160 MG PO TABS: 1.0000 | ORAL_TABLET | Freq: Two times a day (BID) | ORAL | 0 refills | Status: DC

## 2020-10-19 NOTE — Progress Notes (Signed)
Ms. Jennifer [name] is a 30 y.o. female who presents to [facility] clinic today with no complaints. Patient was referred to [facility] due to having a LEEP completed 10/22/2019 that a repeat colposcopy was recommended in 41-months for follow-up that patient has not had completed.   Pap Smear: Pap smear not completed today. Last Pap smear was 06/10/2019 at [facility] clinic and was abnormal - HSIL with positive HPV . Patient had a colposcopy to follow-up 09/03/2019 that showed CIN II-III and a LEEP 10/22/2019 that showed CIN III, severe dysplasia, and carcinoma in situ. Per patient has history of 4-5 abnormal Pap smears in addition to her last Pap smear. Patients previous abnormal 11/13/2017 was HSIL with postive HPV that a colposcopy was completed for follow-up that showed CIN II-III. Patient stated that she did not complete any follow-up for the other 3-4 abnormal Pap smears. Last Pap smear result is available in Epic.   Physical exam: Breasts Breasts symmetrical. No skin abnormalities bilateral breasts. No nipple retraction bilateral breasts. No nipple discharge bilateral breasts. No lymphadenopathy. No lumps palpated bilateral breasts. No complaints of pain or tenderness on exam. Screening mammogram recommended at age 35 unless clinically indicated prior.       Pelvic/Bimanual Patient referred to the [facility] for [facility] for follow-up.   Smoking History: Patient is a former smoker that quit in March 2012.   Patient Navigation: Patient education provided. Access to services provided for patient through BCCCP program.    Breast and Cervical Cancer Risk Assessment: Patient has family history of a maternal aunt having breast cancer. Patient has no known genetic mutations or history of radiation treatment to the chest before age 20. Patient has history of cervical dysplasia. Patient has no history of being immunocompromised or DES exposure in-utero. Breast Cancer risk assessment completed. No breast  cancer risk calculated due to patient is less than 75 years old.  Risk Assessment     Risk Scores       10/19/2020 06/10/2019   Last edited by: Meryl Dare, CMA [place], Fidel Levy, LPN   5-year risk:     Lifetime risk:              A: BCCCP exam without pap smear No complaints.  P: Referred patient to the [facility] for [facility] for a colposcopy per recommendation for follow-up. Appointment scheduled Tuesday, October 19, 2020 at 0935.  Priscille Heidelberg, RN 10/19/2020 9:00 AM

## 2020-10-19 NOTE — Progress Notes (Signed)
Patient ID: Jennifer [name], female   DOB: 1990/10/17, 30 y.o.   MRN: [id number]  Chief Complaint  Patient presents with   Gynecologic Exam  F/u exam after LEEP 10/22/19  HPI Jennifer [name] is a 30 y.o. female.  [id number] Patient's last menstrual period was 10/05/2020 (approximate). 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 HPI  Past Medical History:  Diagnosis Date   BV (bacterial vaginosis)    Chlamydia    Nausea and vomiting in pregnancy prior to [redacted] weeks gestation 12/04/2012    Past Surgical History:  Procedure Laterality Date   CERVICAL BIOPSY  W/ LOOP ELECTRODE EXCISION     COLPOSCOPY  09/03/2019       LEEP  10/22/2019        Family History  Problem Relation Age of Onset   Hypertension Mother    Breast cancer Maternal Aunt     Social History Social History   Tobacco Use   Smoking status: Former    Pack years: 0.00    Types: Cigarettes    Quit date: 07/16/2010    Years since quitting: 10.2   Smokeless tobacco: Never  Vaping Use   Vaping Use: Never used  Substance Use Topics   Alcohol use: Yes    Comment: Occassional   Drug use: No    No Known Allergies  Current Outpatient Medications  Medication Sig Dispense Refill   norgestimate-ethinyl estradiol (ORTHO-CYCLEN) 0.25-35 MG-MCG tablet Take 1 tablet by mouth daily. 84 tablet 0   sulfamethoxazole-trimethoprim (BACTRIM DS) 800-160 MG tablet Take 1 tablet by mouth 2 (two) times daily. 6 tablet 0   nitrofurantoin, macrocrystal-monohydrate, (MACROBID) 100 MG capsule Take 1 capsule (100 mg total) by mouth 2 (two) times daily. 14 capsule 0   No current facility-administered medications for this visit.    Review of Systems Review of Systems  Constitutional: Negative.   Gastrointestinal: Negative.   Genitourinary:  Positive for dysuria.   Blood pressure 123/66, pulse 83, height 5' 5.5" (1.664 m), weight 159 lb (72.1 kg), last menstrual  period 10/05/2020.  Physical Exam Physical Exam Vitals and nursing note reviewed. Exam conducted with a chaperone present.  Constitutional:      Appearance: Normal appearance.  Genitourinary:    General: Normal vulva.     Vagina: Normal.     Cervix: Normal.     Comments: Normal post LEEP cervical appearance, pap done Neurological:     Mental Status: She is alert.  Psychiatric:        Mood and Affect: Mood normal.        Behavior: Behavior normal.    Data Reviewed Biopsy results 09/2019 Urine dipstick shows positive for nitrites and positive for leukocytes.  Micro exam: not done.  Assessment Acute cystitis without hematuria - Plan: sulfamethoxazole-trimethoprim (BACTRIM DS) 800-160 MG tablet, Cytology - PAP( [facility])  Cervical dysplasia - Plan: Cytology - PAP( [place])   Plan I reviewed ASCCP guidelines and discussed management with the patient. Recommend f/u today is pap with cotesting. If result is negative she may have repeat pap in 12 months    Scheryl Darter 10/19/2020, 10:13 AM

## 2020-10-19 NOTE — Patient Instructions (Signed)
Explained breast self awareness with Gennaro Africa. Referred patient to the [facility] for [facility] for a colposcopy per recommendation for follow-up. Appointment scheduled Tuesday, October 19, 2020 at 0935. Patient escorted to appointment following BCCCP appointment. Let patient know a screening mammogram is recommended at age 30 unless clinically indicated prior. Gennaro Africa verbalized understanding.  Crandall Harvel, Kathaleen Maser, RN 9:01 AM

## 2020-10-19 NOTE — Progress Notes (Signed)
Pt states having burning during urination.

## 2020-10-21 LAB — CYTOLOGY - PAP
Comment: NEGATIVE
Diagnosis: NEGATIVE
High risk HPV: NEGATIVE

## 2020-12-21 ENCOUNTER — Other Ambulatory Visit: Payer: Self-pay

## 2020-12-21 DIAGNOSIS — Z3041 Encounter for surveillance of contraceptive pills: Secondary | ICD-10-CM

## 2020-12-23 MED ORDER — NORGESTIMATE-ETH ESTRADIOL 0.25-35 MG-MCG PO TABS: 1.0000 | ORAL_TABLET | Freq: Every day | ORAL | 0 refills | Status: DC

## 2020-12-23 NOTE — Telephone Encounter (Signed)
Per protocol filled for 3 months- had office visit and pap recently Andres Escandon,RN

## 2021-05-01 ENCOUNTER — Other Ambulatory Visit: Payer: Self-pay | Admitting: Obstetrics and Gynecology

## 2021-05-01 DIAGNOSIS — Z3041 Encounter for surveillance of contraceptive pills: Secondary | ICD-10-CM

## 2021-05-03 ENCOUNTER — Other Ambulatory Visit: Payer: Self-pay | Admitting: Obstetrics and Gynecology

## 2021-05-03 DIAGNOSIS — Z3041 Encounter for surveillance of contraceptive pills: Secondary | ICD-10-CM

## 2021-05-04 MED ORDER — NORGESTIMATE-ETH ESTRADIOL 0.25-35 MG-MCG PO TABS: 1.0000 | ORAL_TABLET | Freq: Every day | ORAL | 3 refills | Status: DC

## 2021-07-27 ENCOUNTER — Telehealth: Payer: Self-pay | Admitting: Family Medicine

## 2021-07-27 ENCOUNTER — Other Ambulatory Visit: Payer: Self-pay | Admitting: *Deleted

## 2021-07-27 DIAGNOSIS — N760 Acute vaginitis: Secondary | ICD-10-CM

## 2021-07-27 MED ORDER — METRONIDAZOLE 500 MG PO TABS: 500.0000 mg | ORAL_TABLET | Freq: Two times a day (BID) | ORAL | 0 refills | Status: DC

## 2021-07-27 NOTE — Progress Notes (Signed)
fl

## 2021-07-27 NOTE — Telephone Encounter (Signed)
Requesting a refill on her Birth Control ?

## 2021-07-27 NOTE — Telephone Encounter (Signed)
Per chart review patient had refill of ocp's sent in January to dispense 3 packs at a time with 3 refills. I called [facility] and verified they received order and she has refills. I called Jessica and left message I am calling her back re: her call and that I have checked with pharmacy and she has refills available , to call pharmacy to ask to get ready. ?Nancy Fetter ?

## 2021-09-22 ENCOUNTER — Other Ambulatory Visit (HOSPITAL_COMMUNITY)
Admission: RE | Admit: 2021-09-22 | Discharge: 2021-09-22 | Disposition: A | Discharge status: Home / Self Care | Payer: Medicaid Other | Source: Ambulatory Visit | Attending: Family Medicine | Admitting: Family Medicine

## 2021-09-22 ENCOUNTER — Ambulatory Visit (INDEPENDENT_AMBULATORY_CARE_PROVIDER_SITE_OTHER): Payer: Medicaid Other | Admitting: Family Medicine

## 2021-09-22 ENCOUNTER — Encounter: Payer: Self-pay | Admitting: Family Medicine

## 2021-09-22 ENCOUNTER — Telehealth: Payer: Self-pay

## 2021-09-22 VITALS — BP 112/62 | HR 62 | Ht 66.0 in | Wt 152.0 lb

## 2021-09-22 DIAGNOSIS — Z113 Encounter for screening for infections with a predominantly sexual mode of transmission: Secondary | ICD-10-CM | POA: Diagnosis not present

## 2021-09-22 DIAGNOSIS — Z3041 Encounter for surveillance of contraceptive pills: Secondary | ICD-10-CM

## 2021-09-22 DIAGNOSIS — Z0000 Encounter for general adult medical examination without abnormal findings: Secondary | ICD-10-CM

## 2021-09-22 DIAGNOSIS — N399 Disorder of urinary system, unspecified: Secondary | ICD-10-CM | POA: Diagnosis not present

## 2021-09-22 DIAGNOSIS — N898 Other specified noninflammatory disorders of vagina: Secondary | ICD-10-CM | POA: Diagnosis not present

## 2021-09-22 LAB — POCT URINALYSIS DIP (DEVICE)
Bilirubin Urine: NEGATIVE
Glucose, UA: NEGATIVE mg/dL
Ketones, ur: NEGATIVE mg/dL
Nitrite: POSITIVE — AB
Protein, ur: NEGATIVE mg/dL
Specific Gravity, Urine: 1.025 (ref 1.005–1.030)
Urobilinogen, UA: 0.2 mg/dL (ref 0.0–1.0)
pH: 5.5 (ref 5.0–8.0)

## 2021-09-22 MED ORDER — NORGESTIMATE-ETH ESTRADIOL 0.25-35 MG-MCG PO TABS: 1.0000 | ORAL_TABLET | Freq: Every day | ORAL | 3 refills | Status: DC

## 2021-09-22 NOTE — Progress Notes (Signed)
Patient reports white vaginal discharge with odor for past month- was treated for BV at end of March but feels like it came back. She has also noticed inability to empty bladder completely

## 2021-09-22 NOTE — Patient Instructions (Signed)
Please go to health department for free STD lab screening-- you can make an appointment

## 2021-09-22 NOTE — Progress Notes (Signed)
GYNECOLOGY ANNUAL PREVENTATIVE CARE ENCOUNTER NOTE  History:     Jennifer [name] is a 31 y.o. [phone] female here for a routine annual gynecologic exam.    Current complaints:  --Vaginal discharge for the past month, white with an odor intermittently. Took flagyl and helped for about two weeks but came back. Would like STD testing and swabs today. Sexually active but not using condoms. No known STD exposure. Does have a history of BV.  --Feels like she can't empty her bladder all the way. Some irritation but not really dysuria. Symptoms present for the past month. Denies urgency, frequency, or dribbling.   Denies abnormal vaginal bleeding, pelvic pain, problems with intercourse or other gynecologic concerns.   Otherwise doing well. Going to the gym 4 times weekly. Eating two times daily/balanced.    Gynecologic History Patient's last menstrual period was 09/04/2021 (within days). Contraception: OCP (estrogen/progesterone) Last Pap: 09/2020. Results were: normal with negative HPV>>however this was post-LEEP procedure for CIN-3. Previous recommendation for repeat pap in 1 year.  Last mammogram: never.   Obstetric History OB History  Gravida Para Term Preterm AB Living  3 3 3     3   SAB IAB Ectopic Multiple Live Births          3    # Outcome Date GA Lbr Len/2nd Weight Sex Delivery Anes PTL Lv  3 Term 09/18/17 [redacted]w[redacted]d    Vag-Spont     2 Term 07/16/13 [redacted]w[redacted]d 11:23 / 00:05 8 lb 1.8 oz (3.68 kg) F Vag-Spont None N LIV  1 Term 10/29/08 [redacted]w[redacted]d  8 lb 0.1 oz (3.632 kg) F Vag-Spont None N LIV     Complications: Pyelonephritis affecting pregnancy in second trimester    Past Medical History:  Diagnosis Date   BV (bacterial vaginosis)    Chlamydia    Nausea and vomiting in pregnancy prior to [redacted] weeks gestation 12/04/2012    Past Surgical History:  Procedure Laterality Date   CERVICAL BIOPSY  W/ LOOP ELECTRODE EXCISION     COLPOSCOPY  09/03/2019       LEEP  10/22/2019        No current  outpatient medications on file prior to visit.   No current facility-administered medications on file prior to visit.    No Known Allergies  Social History:  reports that she quit smoking about 11 years ago. Her smoking use included cigarettes. She has never used smokeless tobacco. She reports current alcohol use. She reports that she does not use drugs.  Family History  Problem Relation Age of Onset   Hypertension Mother    Breast cancer Maternal Aunt     The following portions of the patient's history were reviewed and updated as appropriate: allergies, current medications, past family history, past medical history, past social history, past surgical history and problem list.  Review of Systems Pertinent items noted in HPI and remainder of comprehensive ROS otherwise negative.  Physical Exam:  BP 112/62   Pulse 62   Ht 5\' 6"  (1.676 m)   Wt 152 lb (68.9 kg)   LMP 09/04/2021 (Within Days)   BMI 24.53 kg/m  CONSTITUTIONAL: Well-developed, well-nourished female in no acute distress.  HENT:  Normocephalic, atraumatic. Oropharynx is clear and moist EYES: Conjunctivae and EOM are normal.  NECK: Normal range of motion SKIN: Skin is warm and dry. No rash noted. MUSCULOSKELETAL: Normal range of motion.  NEUROLOGIC: Alert and oriented to person, place, and time PSYCHIATRIC: Normal mood and affect.  Normal behavior.  CARDIOVASCULAR: Normal heart rate noted RESPIRATORY: Effort WNL  BREASTS: deferred ABDOMEN: Soft, no distention noted.  No tenderness, rebound or guarding.  PELVIC: Normal appearing external genitalia and urethral meatus; normal appearing vaginal mucosa. Cervix appears normal s/p LEEP. Small amount of white cream like discharge noted.  Performed in the presence of a chaperone.   Assessment and Plan:   1. Annual physical exam Doing well overall. Due for pap smear in one month (surveillance after LEEP), but no insurance that she is aware of. Message sent to see if she can  qualify for pap through [place] program.   2. Screening examination for STD (sexually transmitted disease) No known exposure. Referred to [facility] for lab portion for more affordable option, wanted swabs today d/t current vaginal discharge.  - Cervicovaginal ancillary only( [place])  3. Encounter for surveillance of contraceptive pills Taking as prescribed.  - norgestimate-ethinyl estradiol (ORTHO-CYCLEN) 0.25-35 MG-MCG tablet; Take 1 tablet by mouth daily.  Dispense: 84 tablet; Refill: 3  4. Vaginal discharge Swab as above. Treat accordingly.   5. Urinary problem in female Sensation of incomplete bladder emptying with ?dysuria for the past month. Concurrent with discharge, may be related. UA with + nitrites/small leukocytes-- suspicious for infection but given duration of symptoms, will wait for culture + sensitives (pt h/o resistant E. Coli)      Follow up for pap in 1-2 months then annually pending results.   Darrelyn Hillock, DO  OB Fellow, [place] for [place] 09/22/2021 9:34 AM

## 2021-09-22 NOTE — Telephone Encounter (Signed)
Telephoned patient at mobile number. Left message with BCCCP contact information.

## 2021-09-25 LAB — URINE CULTURE

## 2021-09-27 ENCOUNTER — Other Ambulatory Visit: Payer: Self-pay | Admitting: Family Medicine

## 2021-09-27 DIAGNOSIS — N390 Urinary tract infection, site not specified: Secondary | ICD-10-CM

## 2021-09-27 DIAGNOSIS — B9689 Other specified bacterial agents as the cause of diseases classified elsewhere: Secondary | ICD-10-CM

## 2021-09-27 LAB — CERVICOVAGINAL ANCILLARY ONLY
Bacterial Vaginitis (gardnerella): POSITIVE — AB
Candida Glabrata: NEGATIVE
Candida Vaginitis: NEGATIVE
Chlamydia: NEGATIVE
Comment: NEGATIVE
Comment: NEGATIVE
Comment: NEGATIVE
Comment: NEGATIVE
Comment: NEGATIVE
Comment: NORMAL
Neisseria Gonorrhea: NEGATIVE
Trichomonas: NEGATIVE

## 2021-09-27 MED ORDER — CEPHALEXIN 500 MG PO CAPS: 500.0000 mg | ORAL_CAPSULE | Freq: Three times a day (TID) | ORAL | 0 refills | Status: AC

## 2021-09-27 MED ORDER — METRONIDAZOLE 500 MG PO TABS: 500.0000 mg | ORAL_TABLET | Freq: Two times a day (BID) | ORAL | 0 refills | Status: AC

## 2021-10-05 ENCOUNTER — Telehealth: Payer: Self-pay | Admitting: Family Medicine

## 2021-10-05 NOTE — Telephone Encounter (Signed)
Called patient to schedule for pap smear

## 2021-10-06 ENCOUNTER — Telehealth: Payer: Self-pay

## 2021-10-07 NOTE — Telephone Encounter (Signed)
error 

## 2022-03-17 ENCOUNTER — Encounter: Payer: Self-pay | Admitting: General Practice

## 2022-03-20 ENCOUNTER — Telehealth: Payer: Self-pay | Admitting: *Deleted

## 2022-03-20 MED ORDER — NITROFURANTOIN MONOHYD MACRO 100 MG PO CAPS: 100.0000 mg | ORAL_CAPSULE | Freq: Two times a day (BID) | ORAL | 0 refills | Status: DC

## 2022-03-20 NOTE — Telephone Encounter (Signed)
Called pt and inquired why she had requested refill of Metronidazole. She stated that she has history of UTI and is currently having symptoms of burning with urination and very dark urine. Pt was advised that what she requested will not treat UTI. Pt advised that Rx will be sent for Macrobid per standing order. If she continues to have symptoms of UTI, she will need nurse visit for urine culture. Pt voiced understanding.

## 2022-05-01 NOTE — L&D Delivery Note (Signed)
OB/GYN Faculty Practice Delivery Note  Jennifer [name] is a 32 y.o. [phone] s/p SVD at [redacted]w[redacted]d. She was admitted for IOL suspected LGA.   ROM: 2h 72m with clear fluid GBS Status:  Positive/-- (09/23 1342) Maximum Maternal Temperature: 98.70F  Labor Progress: Initial SVE: 2/50/Ballotable. She then progressed to complete.   Delivery Date/Time: 10/18 0940 Delivery: Called to room and patient was complete and pushing. Head delivered OA. Nuchal cord present. Shoulder and body delivered in usual fashion. Nuchal reduced. Infant with spontaneous cry, placed on mother's abdomen, dried and stimulated. Cord clamped x 2 after 1-minute delay, and cut by FOB. Cord blood drawn. Placenta delivered spontaneously with gentle cord traction. Fundus firm with massage, lower uterine sweep, and Pitocin. Labia, perineum, vagina, and cervix inspected without laceration needing suture repair. Mom and baby doing well.   Baby Weight: pending  Placenta: 3 vessel, intact. Sent to L&D Complications: None Lacerations: none EBL: 55 mL Analgesia: Epidural   Infant:  APGAR (1 MIN): 8  APGAR (5 MINS): 9   Hessie Dibble, MD [facility] Family Medicine Fellow, [facility] for [facility], [facility] 02/16/2023, 10:04 AM

## 2022-07-11 ENCOUNTER — Other Ambulatory Visit (HOSPITAL_COMMUNITY)
Admission: RE | Admit: 2022-07-11 | Discharge: 2022-07-11 | Disposition: A | Discharge status: Home / Self Care | Payer: Medicaid Other | Source: Ambulatory Visit | Attending: Obstetrics and Gynecology | Admitting: Obstetrics and Gynecology

## 2022-07-11 ENCOUNTER — Ambulatory Visit (INDEPENDENT_AMBULATORY_CARE_PROVIDER_SITE_OTHER): Payer: Medicaid Other | Admitting: Emergency Medicine

## 2022-07-11 VITALS — BP 134/82 | HR 92 | Ht 65.0 in | Wt 145.0 lb

## 2022-07-11 DIAGNOSIS — Z3201 Encounter for pregnancy test, result positive: Secondary | ICD-10-CM

## 2022-07-11 DIAGNOSIS — N898 Other specified noninflammatory disorders of vagina: Secondary | ICD-10-CM | POA: Insufficient documentation

## 2022-07-11 DIAGNOSIS — R300 Dysuria: Secondary | ICD-10-CM | POA: Diagnosis not present

## 2022-07-11 DIAGNOSIS — Z3200 Encounter for pregnancy test, result unknown: Secondary | ICD-10-CM

## 2022-07-11 LAB — POCT URINALYSIS DIPSTICK
Bilirubin, UA: NEGATIVE
Blood, UA: NEGATIVE
Glucose, UA: NEGATIVE
Ketones, UA: NEGATIVE
Nitrite, UA: POSITIVE
Protein, UA: NEGATIVE
Spec Grav, UA: 1.015 (ref 1.010–1.025)
Urobilinogen, UA: NEGATIVE E.U./dL — AB
pH, UA: 8.5 — AB (ref 5.0–8.0)

## 2022-07-11 LAB — POCT URINE PREGNANCY: Preg Test, Ur: POSITIVE — AB

## 2022-07-11 NOTE — Progress Notes (Signed)
Jennifer [name] presents today for UPT. She has breast tenderness. LMP: 05/16/2022    OBJECTIVE: Appears well, in no apparent distress.  OB History     Gravida  3   Para  3   Term  3   Preterm      AB      Living  3      SAB      IAB      Ectopic      Multiple      Live Births  3          Home UPT Result: positive  In-Office UPT result: positive I have reviewed the patient's medical, obstetrical, social, and family histories, and medications.   ASSESSMENT: Positive pregnancy test  PLAN Prenatal care to be completed at: [facility]

## 2022-07-12 LAB — CERVICOVAGINAL ANCILLARY ONLY
Bacterial Vaginitis (gardnerella): POSITIVE — AB
Candida Glabrata: NEGATIVE
Candida Vaginitis: NEGATIVE
Chlamydia: POSITIVE — AB
Comment: NEGATIVE
Comment: NEGATIVE
Comment: NEGATIVE
Comment: NEGATIVE
Comment: NEGATIVE
Comment: NORMAL
Neisseria Gonorrhea: NEGATIVE
Trichomonas: NEGATIVE

## 2022-07-13 ENCOUNTER — Ambulatory Visit (INDEPENDENT_AMBULATORY_CARE_PROVIDER_SITE_OTHER): Payer: Medicaid Other

## 2022-07-13 ENCOUNTER — Other Ambulatory Visit: Payer: Self-pay | Admitting: *Deleted

## 2022-07-13 ENCOUNTER — Other Ambulatory Visit (INDEPENDENT_AMBULATORY_CARE_PROVIDER_SITE_OTHER): Payer: Medicaid Other

## 2022-07-13 ENCOUNTER — Encounter: Payer: Self-pay | Admitting: Obstetrics & Gynecology

## 2022-07-13 VITALS — BP 119/75 | HR 81 | Ht 65.5 in | Wt 142.5 lb

## 2022-07-13 DIAGNOSIS — Z1339 Encounter for screening examination for other mental health and behavioral disorders: Secondary | ICD-10-CM | POA: Diagnosis not present

## 2022-07-13 DIAGNOSIS — O3680X0 Pregnancy with inconclusive fetal viability, not applicable or unspecified: Secondary | ICD-10-CM

## 2022-07-13 DIAGNOSIS — Z3481 Encounter for supervision of other normal pregnancy, first trimester: Secondary | ICD-10-CM | POA: Diagnosis not present

## 2022-07-13 DIAGNOSIS — Z3480 Encounter for supervision of other normal pregnancy, unspecified trimester: Secondary | ICD-10-CM

## 2022-07-13 DIAGNOSIS — N390 Urinary tract infection, site not specified: Secondary | ICD-10-CM

## 2022-07-13 DIAGNOSIS — Z3A08 8 weeks gestation of pregnancy: Secondary | ICD-10-CM | POA: Diagnosis not present

## 2022-07-13 MED ORDER — AZITHROMYCIN 500 MG PO TABS: 1000.0000 mg | ORAL_TABLET | Freq: Once | ORAL | 0 refills | Status: AC

## 2022-07-13 MED ORDER — CEFADROXIL 500 MG PO CAPS: 500.0000 mg | ORAL_CAPSULE | Freq: Two times a day (BID) | ORAL | 0 refills | Status: DC

## 2022-07-13 MED ORDER — BLOOD PRESSURE KIT DEVI: 1.0000 | 0 refills | Status: DC

## 2022-07-13 MED ORDER — METRONIDAZOLE 0.75 % VA GEL: 1.0000 | Freq: Every day | VAGINAL | 1 refills | Status: DC

## 2022-07-13 NOTE — Progress Notes (Signed)
STI report faxed to [facility].

## 2022-07-13 NOTE — Progress Notes (Signed)
New OB Intake  I connected with Jennifer [name]  on 07/13/22 at  8:15 AM EDT by in person and verified that I am speaking with the correct person using two identifiers. Nurse is located at [facility] and pt is located at [place].  I discussed the limitations, risks, security and privacy concerns of performing an evaluation and management service by telephone and the availability of in person appointments. I also discussed with the patient that there may be a patient responsible charge related to this service. The patient expressed understanding and agreed to proceed.  I explained I am completing New OB Intake today. We discussed EDD of 02/20/23 that is based on LMP of 05/16/22. Pt is G4/P3003. I reviewed her allergies, medications, Medical/Surgical/OB history, and appropriate screenings. I informed her of [facility] services. [facility] information placed in AVS. Based on history, this is a low risk pregnancy.  Patient Active Problem List   Diagnosis Date Noted   Cervical dysplasia 09/03/2019   Vaginal discharge 06/10/2019   BV (bacterial vaginosis) 12/04/2012    Concerns addressed today  Delivery Plans Plans to deliver at [facility]. Patient given information for [facility] Healthy Baby website for more information about [facility] and Center. Patient is not interested in water birth. Offered upcoming OB visit with CNM to discuss further.  MyChart/Babyscripts MyChart access verified. I explained pt will have some visits in office and some virtually. Babyscripts instructions given and order placed. Patient verifies receipt of registration text/e-mail. Account successfully created and app downloaded.  Blood Pressure Cuff/Weight Scale Blood pressure cuff ordered for patient to pick-up from [employer]. Explained after first prenatal appt pt will check weekly and document in 80. Patient does not have weight scale; declines to track weight weekly in Babyscripts.  Anatomy US Explained first  scheduled [place] will be around 19 weeks. Dating and viability u/s performed today. Anatomy US TBD.  Labs Discussed Johnsie Cancel genetic screening with patient. Would like both Panorama and Horizon drawn at new OB visit. Routine prenatal labs needed.  COVID Vaccine Patient has not had COVID vaccine.   Is patient a CenteringPregnancy candidate?  Declined Declined due to Group setting Not a candidate due to  If accepted,    Social Determinants of Health Food Insecurity: Patient denies food insecurity. WIC Referral: Patient is interested in referral to [facility].  Transportation: Patient denies transportation needs. Childcare: Discussed no children allowed at ultrasound appointments. Offered childcare services; patient declines childcare services at this time.  Interested in [place]? If yes, send referral.   First visit review I reviewed new OB appt with patient. I explained they will have a provider visit that includes pap smear, genetic screening, and discuss plan provider. Explained pt will be seen by Emeterio Reeve at first visit; encounter routed to appropriate provider. Explained that patient will be seen by pregnancy navigator following visit with provider.   Lucianne Lei, RN 07/13/2022  8:09 AM

## 2022-07-14 LAB — CBC/D/PLT+RPR+RH+ABO+RUBIGG...
Antibody Screen: NEGATIVE
Basophils Absolute: 0 10*3/uL (ref 0.0–0.2)
Basos: 1 %
EOS (ABSOLUTE): 0.1 10*3/uL (ref 0.0–0.4)
Eos: 1 %
HCV Ab: NONREACTIVE
HIV Screen 4th Generation wRfx: NONREACTIVE
Hematocrit: 40.6 % (ref 34.0–46.6)
Hemoglobin: 13.3 g/dL (ref 11.1–15.9)
Hepatitis B Surface Ag: NEGATIVE
Immature Grans (Abs): 0 10*3/uL (ref 0.0–0.1)
Immature Granulocytes: 0 %
Lymphocytes Absolute: 1.8 10*3/uL (ref 0.7–3.1)
Lymphs: 27 %
MCH: 30.5 pg (ref 26.6–33.0)
MCHC: 32.8 g/dL (ref 31.5–35.7)
MCV: 93 fL (ref 79–97)
Monocytes Absolute: 0.5 10*3/uL (ref 0.1–0.9)
Monocytes: 7 %
Neutrophils Absolute: 4.3 10*3/uL (ref 1.4–7.0)
Neutrophils: 64 %
Platelets: 241 10*3/uL (ref 150–450)
RBC: 4.36 x10E6/uL (ref 3.77–5.28)
RDW: 12.3 % (ref 11.7–15.4)
RPR Ser Ql: NONREACTIVE
Rh Factor: POSITIVE
Rubella Antibodies, IGG: 4.93 index (ref >0.99)
WBC: 6.7 10*3/uL (ref 3.4–10.8)

## 2022-07-14 LAB — HCV INTERPRETATION

## 2022-07-17 ENCOUNTER — Encounter: Payer: Self-pay | Admitting: Obstetrics & Gynecology

## 2022-07-17 LAB — CULTURE, OB URINE

## 2022-07-17 LAB — URINE CULTURE, OB REFLEX

## 2022-08-18 ENCOUNTER — Institutional Professional Consult (permissible substitution): Payer: Medicaid Other | Admitting: Licensed Clinical Social Worker

## 2022-08-18 ENCOUNTER — Encounter: Payer: Self-pay | Admitting: Obstetrics & Gynecology

## 2022-08-18 ENCOUNTER — Other Ambulatory Visit (HOSPITAL_COMMUNITY)
Admission: RE | Admit: 2022-08-18 | Discharge: 2022-08-18 | Disposition: A | Discharge status: Home / Self Care | Payer: Medicaid Other | Source: Ambulatory Visit | Attending: Obstetrics & Gynecology | Admitting: Obstetrics & Gynecology

## 2022-08-18 ENCOUNTER — Ambulatory Visit (INDEPENDENT_AMBULATORY_CARE_PROVIDER_SITE_OTHER): Payer: Medicaid Other | Admitting: Obstetrics & Gynecology

## 2022-08-18 VITALS — BP 117/75 | HR 92 | Wt 150.2 lb

## 2022-08-18 DIAGNOSIS — R300 Dysuria: Secondary | ICD-10-CM | POA: Diagnosis not present

## 2022-08-18 DIAGNOSIS — Z3A13 13 weeks gestation of pregnancy: Secondary | ICD-10-CM

## 2022-08-18 DIAGNOSIS — Z3481 Encounter for supervision of other normal pregnancy, first trimester: Secondary | ICD-10-CM

## 2022-08-18 DIAGNOSIS — Z1339 Encounter for screening examination for other mental health and behavioral disorders: Secondary | ICD-10-CM | POA: Diagnosis not present

## 2022-08-18 DIAGNOSIS — Z3480 Encounter for supervision of other normal pregnancy, unspecified trimester: Secondary | ICD-10-CM | POA: Insufficient documentation

## 2022-08-18 LAB — POCT URINALYSIS DIPSTICK
Bilirubin, UA: NEGATIVE
Blood, UA: NEGATIVE
Glucose, UA: NEGATIVE
Ketones, UA: NEGATIVE
Nitrite, UA: POSITIVE
Protein, UA: NEGATIVE
Spec Grav, UA: <=1.005 — AB (ref 1.010–1.025)
Urobilinogen, UA: 0.2 E.U./dL
pH, UA: 8 (ref 5.0–8.0)

## 2022-08-18 MED ORDER — SULFAMETHOXAZOLE-TRIMETHOPRIM 800-160 MG PO TABS: 1.0000 | ORAL_TABLET | Freq: Two times a day (BID) | ORAL | 1 refills | Status: DC

## 2022-08-18 NOTE — Progress Notes (Signed)
  Subjective:Jennifer    [name] being seen today for her first obstetrical visit.  Her obstetrical history is significant for  previous HSIL CIN 3 . Patient does intend to breast feed. Pregnancy history fully reviewed.  Patient reports no complaints.  Vitals:   08/18/22 0830  BP: 117/75  Pulse: 92  Weight: 150 lb 3.2 oz (68.1 kg)    HISTORY: OB History  Gravida Para Term Preterm AB Living  SAB IAB Ectopic Multiple Live Births          3    # Outcome Date GA Lbr Len/2nd Weight Sex Delivery Anes PTL Lv  4 Current           3 Term 09/18/17 [redacted]w[redacted]d  8 lb 9 oz (3.884 kg) F Vag-Spont   LIV  2 Term 07/16/13 [redacted]w[redacted]d 11:23 / 00:05 8 lb 1.8 oz (3.68 kg) F Vag-Spont None N LIV  1 Term 10/29/08 [redacted]w[redacted]d  8 lb 0.1 oz (3.632 kg) F Vag-Spont None N LIV     Complications: Pyelonephritis affecting pregnancy in second trimester   Past Medical History:  Diagnosis Date   BV (bacterial vaginosis)    Chlamydia    Nausea and vomiting in pregnancy prior to [redacted] weeks gestation 12/04/2012   Past Surgical History:  Procedure Laterality Date   CERVICAL BIOPSY  W/ LOOP ELECTRODE EXCISION     COLPOSCOPY  09/03/2019       LEEP  10/22/2019       Family History  Problem Relation Age of Onset   Hypertension Mother    Breast cancer Maternal Aunt      Exam    Uterus:   14 week  Pelvic Exam: Deferred today                              System: Breast:     Skin: normal coloration and turgor, no rashes    Neurologic: oriented, normal mood   Extremities: normal strength, tone, and muscle mass   HEENT PERRLA and trachea midline   Mouth/Teeth mucous membranes moist, pharynx normal without lesions   Neck supple   Cardiovascular: regular rate and rhythm, no murmurs or gallops   Respiratory:  appears well, vitals normal, no respiratory distress, acyanotic, normal RR, neck free of mass or lymphadenopathy, chest clear, no wheezing, crepitations, rhonchi, normal symmetric  air entry   Abdomen: soft, non-tender; bowel sounds normal; no masses,  no organomegaly   Urinary:       Assessment:    Pregnancy: [name] Patient Active Problem List   Diagnosis Date Noted   Supervision of other normal pregnancy, antepartum 07/13/2022   Cervical dysplasia 09/03/2019        Plan:     Initial labs drawn. Prenatal vitamins. Problem list reviewed and updated. Genetic Screening discussed : ordered.  Ultrasound discussed; fetal survey: ordered.  Follow up in 4 weeks. 50% of 30 min visit spent on counseling and coordination of care.  Needs f/u pap  Scheryl Darter 08/18/2022

## 2022-08-18 NOTE — Progress Notes (Signed)
New OB 13.3 wks Needs TOC/ Chlamydia, reports discharge with odor  Had E. Coli UTI, TX Duricef, reports cont'd dysuria and cloudy urine.

## 2022-08-21 LAB — CERVICOVAGINAL ANCILLARY ONLY
Bacterial Vaginitis (gardnerella): POSITIVE — AB
Candida Glabrata: NEGATIVE
Candida Vaginitis: NEGATIVE
Chlamydia: NEGATIVE
Comment: NEGATIVE
Comment: NEGATIVE
Comment: NEGATIVE
Comment: NEGATIVE
Comment: NEGATIVE
Comment: NORMAL
Neisseria Gonorrhea: NEGATIVE
Trichomonas: NEGATIVE

## 2022-08-21 LAB — URINE CULTURE, OB REFLEX

## 2022-08-21 LAB — CULTURE, OB URINE

## 2022-08-24 ENCOUNTER — Encounter: Payer: Self-pay | Admitting: Obstetrics & Gynecology

## 2022-08-25 LAB — PANORAMA PRENATAL TEST FULL PANEL:PANORAMA TEST PLUS 5 ADDITIONAL MICRODELETIONS: FETAL FRACTION: 11.8

## 2022-09-07 ENCOUNTER — Encounter: Payer: Self-pay | Admitting: *Deleted

## 2022-09-07 ENCOUNTER — Encounter: Payer: Self-pay | Admitting: Obstetrics & Gynecology

## 2022-09-20 ENCOUNTER — Other Ambulatory Visit (HOSPITAL_COMMUNITY)
Admission: RE | Admit: 2022-09-20 | Discharge: 2022-09-20 | Disposition: A | Discharge status: Home / Self Care | Payer: Medicaid Other | Source: Ambulatory Visit | Attending: Obstetrics & Gynecology | Admitting: Obstetrics & Gynecology

## 2022-09-20 ENCOUNTER — Ambulatory Visit (INDEPENDENT_AMBULATORY_CARE_PROVIDER_SITE_OTHER): Payer: Medicaid Other | Admitting: Obstetrics & Gynecology

## 2022-09-20 ENCOUNTER — Ambulatory Visit (INDEPENDENT_AMBULATORY_CARE_PROVIDER_SITE_OTHER): Payer: Medicaid Other | Admitting: Licensed Clinical Social Worker

## 2022-09-20 VITALS — BP 100/68 | HR 106 | Wt 153.0 lb

## 2022-09-20 DIAGNOSIS — O2340 Unspecified infection of urinary tract in pregnancy, unspecified trimester: Secondary | ICD-10-CM | POA: Insufficient documentation

## 2022-09-20 DIAGNOSIS — N879 Dysplasia of cervix uteri, unspecified: Secondary | ICD-10-CM

## 2022-09-20 DIAGNOSIS — O2343 Unspecified infection of urinary tract in pregnancy, third trimester: Secondary | ICD-10-CM

## 2022-09-20 DIAGNOSIS — Z3480 Encounter for supervision of other normal pregnancy, unspecified trimester: Secondary | ICD-10-CM

## 2022-09-20 DIAGNOSIS — N390 Urinary tract infection, site not specified: Secondary | ICD-10-CM

## 2022-09-20 MED ORDER — CEFADROXIL 500 MG PO CAPS
500.0000 mg | ORAL_CAPSULE | Freq: Two times a day (BID) | ORAL | 0 refills | Status: DC
Start: 2022-09-20 — End: 2022-10-08

## 2022-09-20 NOTE — Progress Notes (Signed)
Pt complains of possible yeast inf, self swab today. Pt also left urine sample for UTI x 2.

## 2022-09-20 NOTE — BH Specialist Note (Signed)
Integrated Behavioral Health Initial In-Person Visit  MRN: [id number] Name: RHODES BORUNDA  Number of Integrated Behavioral Health Clinician visits: 1 Session Start time:   11:25am Session End time: 11:40am Total time in minutes: 15 mins in person at [facility]   Types of Service: General Behavioral Integrated Care (BHI)  Interpretor:No. Interpretor Name and Language: none   Warm Hand Off Completed.        Subjective: NATALIJA ORFANOS is a 32 y.o. female accompanied by n/a Patient was referred by Dr. Debroah Loop for new ob introduction. Patient reports the following symptoms/concerns: no reported concerns  Duration of problem: n/a; Severity of problem: n/a  Objective: Mood: good and Affect: Appropriate Risk of harm to self or others: No plan to harm self or others  Life Context: Family and Social: Lives with family and children in [place]  School/Work: n/a Self-Care: n/a Life Changes: n/a  Patient and/or Family's Strengths/Protective Factors: Concrete supports in place (healthy food, safe environments, etc.)  Goals Addressed: Patient will: Take prenatal vitamins  Prioritize rest and reduce stress   Attend scheduled prenatal appts    Gwyndolyn Saxon, LCSW

## 2022-09-20 NOTE — Progress Notes (Signed)
   PRENATAL VISIT NOTE  Subjective:  Jennifer [name] is a 32 y.o. [phone] at [redacted]w[redacted]d being seen today for ongoing prenatal care.  She is currently monitored for the following issues for this high-risk pregnancy and has Cervical dysplasia and Supervision of other normal pregnancy, antepartum on their problem list.  Patient reports  dysuria .  Contractions: Not present. Vag. Bleeding: None.  Movement: Present. Denies leaking of fluid.   The following portions of the patient's history were reviewed and updated as appropriate: allergies, current medications, past family history, past medical history, past social history, past surgical history and problem list.   Objective:   Vitals:   09/20/22 0923  BP: 100/68  Pulse: (!) 106  Weight: 153 lb (69.4 kg)    Fetal Status: Fetal Heart Rate (bpm): 150   Movement: Present     General:  Alert, oriented and cooperative. Patient is in no acute distress.  Skin: Skin is warm and dry. No rash noted.   Cardiovascular: Normal heart rate noted  Respiratory: Normal respiratory effort, no problems with respiration noted  Abdomen: Soft, gravid, appropriate for gestational age.  Pain/Pressure: Absent     Pelvic: Cervical exam deferred        Extremities: Normal range of motion.     Mental Status: Normal mood and affect. Normal behavior. Normal judgment and thought content.   Assessment and Plan:  Pregnancy: G4P3003 at [redacted]w[redacted]d 1. Cervical dysplasia   2. Supervision of other normal pregnancy, antepartum UTI- treatment and suppression after this - Cervicovaginal ancillary only( [place]) - Culture, OB Urine  3. Urinary tract infection without hematuria, site unspecified  - cefadroxil (DURICEF) 500 MG capsule; Take 1 capsule (500 mg total) by mouth 2 (two) times daily.  Dispense: 14 capsule; Refill: 0  Preterm labor symptoms and general obstetric precautions including but not limited to vaginal bleeding, contractions, leaking of fluid and fetal  movement were reviewed in detail with the patient. Please refer to After Visit Summary for other counseling recommendations.   Return in about 2 weeks (around 10/04/2022).  Future Appointments  Date Time Provider Department Center  09/27/2022  9:30 AM [facility] NURSE [facility]  09/27/2022  9:45 AM [facility] US7 [facility]    Scheryl Darter, MD

## 2022-09-21 LAB — CERVICOVAGINAL ANCILLARY ONLY
Bacterial Vaginitis (gardnerella): POSITIVE — AB
Chlamydia: NEGATIVE
Comment: NEGATIVE
Comment: NEGATIVE
Comment: NEGATIVE
Comment: NORMAL
Neisseria Gonorrhea: NEGATIVE
Trichomonas: NEGATIVE

## 2022-09-24 LAB — URINE CULTURE, OB REFLEX

## 2022-09-24 LAB — CULTURE, OB URINE

## 2022-09-26 ENCOUNTER — Other Ambulatory Visit: Payer: Self-pay

## 2022-09-26 ENCOUNTER — Encounter: Payer: Self-pay | Admitting: Obstetrics & Gynecology

## 2022-09-26 ENCOUNTER — Other Ambulatory Visit: Payer: Self-pay | Admitting: Family Medicine

## 2022-09-26 DIAGNOSIS — B9689 Other specified bacterial agents as the cause of diseases classified elsewhere: Secondary | ICD-10-CM

## 2022-09-26 MED ORDER — METRONIDAZOLE 500 MG PO TABS
500.0000 mg | ORAL_TABLET | Freq: Two times a day (BID) | ORAL | 0 refills | Status: DC
Start: 2022-09-26 — End: 2022-10-08

## 2022-09-26 MED ORDER — AMOXICILLIN-POT CLAVULANATE 875-125 MG PO TABS: 1.0000 | ORAL_TABLET | Freq: Two times a day (BID) | ORAL | 0 refills | Status: DC

## 2022-09-26 NOTE — Progress Notes (Signed)
Spoke with nursing staff regarding this patient's UTI.  UTI growing E. coli as well as strep uberis.  Spoke with pharmacy regarding treatment and they report thatS.Uberis is nonpathogenic and no need to treat but recommended treating the E. coli UTI with Augmentin given Duricef and Bactrim have already been used.  Prescription sent for Augmentin for 7 days.

## 2022-09-27 ENCOUNTER — Ambulatory Visit: Payer: Medicaid Other

## 2022-09-27 ENCOUNTER — Other Ambulatory Visit: Payer: Self-pay | Admitting: Obstetrics & Gynecology

## 2022-09-27 DIAGNOSIS — Z3480 Encounter for supervision of other normal pregnancy, unspecified trimester: Secondary | ICD-10-CM

## 2022-10-02 ENCOUNTER — Ambulatory Visit: Payer: Medicaid Other | Attending: Obstetrics & Gynecology

## 2022-10-02 ENCOUNTER — Encounter: Payer: Self-pay | Admitting: *Deleted

## 2022-10-02 ENCOUNTER — Ambulatory Visit: Payer: Medicaid Other | Admitting: *Deleted

## 2022-10-02 VITALS — BP 103/54 | HR 69

## 2022-10-02 DIAGNOSIS — O358XX0 Maternal care for other (suspected) fetal abnormality and damage, not applicable or unspecified: Secondary | ICD-10-CM | POA: Diagnosis not present

## 2022-10-02 DIAGNOSIS — Z363 Encounter for antenatal screening for malformations: Secondary | ICD-10-CM | POA: Diagnosis present

## 2022-10-02 DIAGNOSIS — O3442 Maternal care for other abnormalities of cervix, second trimester: Secondary | ICD-10-CM | POA: Insufficient documentation

## 2022-10-02 DIAGNOSIS — Z3686 Encounter for antenatal screening for cervical length: Secondary | ICD-10-CM | POA: Diagnosis not present

## 2022-10-02 DIAGNOSIS — Z3480 Encounter for supervision of other normal pregnancy, unspecified trimester: Secondary | ICD-10-CM | POA: Diagnosis not present

## 2022-10-02 DIAGNOSIS — Z3A19 19 weeks gestation of pregnancy: Secondary | ICD-10-CM | POA: Diagnosis not present

## 2022-10-02 DIAGNOSIS — Z3689 Encounter for other specified antenatal screening: Secondary | ICD-10-CM | POA: Insufficient documentation

## 2022-10-05 ENCOUNTER — Ambulatory Visit (INDEPENDENT_AMBULATORY_CARE_PROVIDER_SITE_OTHER): Payer: Medicaid Other | Admitting: Obstetrics and Gynecology

## 2022-10-05 VITALS — BP 104/66 | HR 80 | Wt 151.0 lb

## 2022-10-05 DIAGNOSIS — Z3A20 20 weeks gestation of pregnancy: Secondary | ICD-10-CM

## 2022-10-05 DIAGNOSIS — Z3480 Encounter for supervision of other normal pregnancy, unspecified trimester: Secondary | ICD-10-CM

## 2022-10-05 DIAGNOSIS — O2342 Unspecified infection of urinary tract in pregnancy, second trimester: Secondary | ICD-10-CM

## 2022-10-05 NOTE — Progress Notes (Signed)
ROB, c/o Abx not working.

## 2022-10-05 NOTE — Progress Notes (Signed)
   PRENATAL VISIT NOTE  Subjective:  Jennifer [name] is a 32 y.o. [phone] at 110w2d being seen today for ongoing prenatal care.  She is currently monitored for the following issues for this low-risk pregnancy and has Cervical dysplasia; Supervision of other normal pregnancy, antepartum; and UTI (urinary tract infection) during pregnancy on their problem list.  Patient doing well with no acute concerns today. She reports  continued dysuria and frequency .  Contractions: Not present. Vag. Bleeding: None.  Movement: Present. Denies leaking of fluid.   The following portions of the patient's history were reviewed and updated as appropriate: allergies, current medications, past family history, past medical history, past social history, past surgical history and problem list. Problem list updated.  Objective:   Vitals:   10/05/22 0959  BP: 104/66  Pulse: 80  Weight: 151 lb (68.5 kg)    Fetal Status: Fetal Heart Rate (bpm): 148 Fundal Height: 20 cm Movement: Present     General:  Alert, oriented and cooperative. Patient is in no acute distress.  Skin: Skin is warm and dry. No rash noted.   Cardiovascular: Normal heart rate noted  Respiratory: Normal respiratory effort, no problems with respiration noted  Abdomen: Soft, gravid, appropriate for gestational age.  Pain/Pressure: Absent     Pelvic: Cervical exam deferred        Extremities: Normal range of motion.  Edema: None  Mental Status:  Normal mood and affect. Normal behavior. Normal judgment and thought content.   Assessment and Plan:  Pregnancy: [id number] at [redacted]w[redacted]d  1. [redacted] weeks gestation of pregnancy   2. Supervision of other normal pregnancy, antepartum Continue routine prenatal care - AFP, Serum, Open Spina Bifida  3. Urinary tract infection in mother during second trimester of pregnancy Pt had e coli on last urine culture, she has not picked up the augmentin yet and still notes symptoms, pt advised to start abx at this  time.  Preterm labor symptoms and general obstetric precautions including but not limited to vaginal bleeding, contractions, leaking of fluid and fetal movement were reviewed in detail with the patient.  Please refer to After Visit Summary for other counseling recommendations.   Return in about 4 weeks (around 11/02/2022) for ROB, in person.   Mariel Aloe, MD Faculty Attending Center for [facility]

## 2022-10-06 ENCOUNTER — Other Ambulatory Visit: Payer: Self-pay

## 2022-10-06 ENCOUNTER — Encounter (HOSPITAL_COMMUNITY): Payer: Self-pay | Admitting: Obstetrics and Gynecology

## 2022-10-06 ENCOUNTER — Inpatient Hospital Stay (HOSPITAL_COMMUNITY)
Admission: EM | Admit: 2022-10-06 | Discharge: 2022-10-08 | DRG: 832 | Disposition: A | Discharge status: Home / Self Care | Payer: Medicaid Other | Attending: Obstetrics and Gynecology | Admitting: Obstetrics and Gynecology

## 2022-10-06 DIAGNOSIS — Z3A20 20 weeks gestation of pregnancy: Secondary | ICD-10-CM | POA: Diagnosis not present

## 2022-10-06 DIAGNOSIS — Z87891 Personal history of nicotine dependence: Secondary | ICD-10-CM

## 2022-10-06 DIAGNOSIS — O2302 Infections of kidney in pregnancy, second trimester: Principal | ICD-10-CM | POA: Diagnosis present

## 2022-10-06 DIAGNOSIS — O98812 Other maternal infectious and parasitic diseases complicating pregnancy, second trimester: Secondary | ICD-10-CM | POA: Diagnosis present

## 2022-10-06 DIAGNOSIS — Z3480 Encounter for supervision of other normal pregnancy, unspecified trimester: Principal | ICD-10-CM

## 2022-10-06 DIAGNOSIS — B9620 Unspecified Escherichia coli [E. coli] as the cause of diseases classified elsewhere: Secondary | ICD-10-CM | POA: Diagnosis present

## 2022-10-06 DIAGNOSIS — Z87410 Personal history of cervical dysplasia: Secondary | ICD-10-CM

## 2022-10-06 DIAGNOSIS — A749 Chlamydial infection, unspecified: Secondary | ICD-10-CM | POA: Insufficient documentation

## 2022-10-06 LAB — URINALYSIS, ROUTINE W REFLEX MICROSCOPIC
Bilirubin Urine: NEGATIVE
Glucose, UA: NEGATIVE mg/dL
Hgb urine dipstick: NEGATIVE
Ketones, ur: NEGATIVE mg/dL
Nitrite: POSITIVE — AB
Protein, ur: 100 mg/dL — AB
Specific Gravity, Urine: 1.017 (ref 1.005–1.030)
WBC, UA: >50 WBC/hpf (ref 0–5)
pH: 6 (ref 5.0–8.0)

## 2022-10-06 LAB — COMPREHENSIVE METABOLIC PANEL
ALT: 17 U/L (ref 0–44)
AST: 19 U/L (ref 15–41)
Albumin: 2.9 g/dL — ABNORMAL LOW (ref 3.5–5.0)
Alkaline Phosphatase: 87 U/L (ref 38–126)
Anion gap: 9 (ref 5–15)
BUN: 11 mg/dL (ref 6–20)
CO2: 21 mmol/L — ABNORMAL LOW (ref 22–32)
Calcium: 8.8 mg/dL — ABNORMAL LOW (ref 8.9–10.3)
Chloride: 103 mmol/L (ref 98–111)
Creatinine, Ser: 0.79 mg/dL (ref 0.44–1.00)
GFR, Estimated: >60 mL/min (ref >60)
Glucose, Bld: 110 mg/dL — ABNORMAL HIGH (ref 70–99)
Potassium: 3.2 mmol/L — ABNORMAL LOW (ref 3.5–5.1)
Sodium: 133 mmol/L — ABNORMAL LOW (ref 135–145)
Total Bilirubin: 0.4 mg/dL (ref 0.3–1.2)
Total Protein: 6.4 g/dL — ABNORMAL LOW (ref 6.5–8.1)

## 2022-10-06 LAB — CBC WITH DIFFERENTIAL/PLATELET
Abs Immature Granulocytes: 0.03 10*3/uL (ref 0.00–0.07)
Basophils Absolute: 0 10*3/uL (ref 0.0–0.1)
Basophils Relative: 0 %
Eosinophils Absolute: 0.1 10*3/uL (ref 0.0–0.5)
Eosinophils Relative: 1 %
HCT: 33.9 % — ABNORMAL LOW (ref 36.0–46.0)
Hemoglobin: 11.4 g/dL — ABNORMAL LOW (ref 12.0–15.0)
Immature Granulocytes: 0 %
Lymphocytes Relative: 14 %
Lymphs Abs: 1.6 10*3/uL (ref 0.7–4.0)
MCH: 32 pg (ref 26.0–34.0)
MCHC: 33.6 g/dL (ref 30.0–36.0)
MCV: 95.2 fL (ref 80.0–100.0)
Monocytes Absolute: 0.7 10*3/uL (ref 0.1–1.0)
Monocytes Relative: 6 %
Neutro Abs: 8.6 10*3/uL — ABNORMAL HIGH (ref 1.7–7.7)
Neutrophils Relative %: 79 %
Platelets: 215 10*3/uL (ref 150–400)
RBC: 3.56 MIL/uL — ABNORMAL LOW (ref 3.87–5.11)
RDW: 13.4 % (ref 11.5–15.5)
WBC: 11.1 10*3/uL — ABNORMAL HIGH (ref 4.0–10.5)
nRBC: 0 % (ref 0.0–0.2)

## 2022-10-06 LAB — TYPE AND SCREEN: ABO/RH(D): O POS

## 2022-10-06 LAB — PREGNANCY, URINE: Preg Test, Ur: POSITIVE — AB

## 2022-10-06 MED ORDER — ONDANSETRON 4 MG PO TBDP: 4.0000 mg | ORAL_TABLET | Freq: Four times a day (QID) | ORAL | Status: DC | PRN

## 2022-10-06 MED ORDER — LACTATED RINGERS IV SOLN: INTRAVENOUS | Status: DC

## 2022-10-06 MED ORDER — LACTATED RINGERS IV SOLN: 125.0000 mL/h | INTRAVENOUS | Status: DC

## 2022-10-06 MED ORDER — SODIUM CHLORIDE 0.9 % IV SOLN: INTRAVENOUS | Status: DC

## 2022-10-06 MED ORDER — ACETAMINOPHEN 325 MG PO TABS
650.0000 mg | ORAL_TABLET | ORAL | Status: DC | PRN
  Administered 2022-10-06 – 2022-10-07 (×2): 650 mg via ORAL
  Filled 2022-10-06 (×2): qty 2

## 2022-10-06 MED ORDER — ONDANSETRON HCL 4 MG/2ML IJ SOLN: 4.0000 mg | Freq: Four times a day (QID) | INTRAMUSCULAR | Status: DC | PRN

## 2022-10-06 MED ORDER — DOCUSATE SODIUM 100 MG PO CAPS
100.0000 mg | ORAL_CAPSULE | Freq: Every day | ORAL | Status: DC
  Administered 2022-10-07: 100 mg via ORAL
  Filled 2022-10-06: qty 1

## 2022-10-06 MED ORDER — SODIUM CHLORIDE 0.9 % IV SOLN
2.0000 g | INTRAVENOUS | Status: DC
  Administered 2022-10-06 – 2022-10-07 (×2): 2 g via INTRAVENOUS
  Filled 2022-10-06 (×3): qty 20

## 2022-10-06 MED ORDER — PRENATAL MULTIVITAMIN CH
1.0000 | ORAL_TABLET | Freq: Every day | ORAL | Status: DC
  Administered 2022-10-07: 1 via ORAL
  Filled 2022-10-06: qty 1

## 2022-10-06 MED ORDER — CALCIUM CARBONATE ANTACID 500 MG PO CHEW: 2.0000 | CHEWABLE_TABLET | ORAL | Status: DC | PRN

## 2022-10-06 NOTE — MAU Note (Signed)
Pt says she came from [facility] ER- PNC- [facility] - DX UTI - every mth- takes antx- finished last round 5 weeks ago  Went to [facility] - on middle May - Told has another UTI- sent in antx- but not correct- then cx came back with 2 kinds of bacteria .  So she picked up new antx this am after Dr appointment-but has not started them yet- S/S worsened  at 6pm - Temp at home was 101.7- she took 1 reg Tyl at 6pm. Now nausea-and right sided back pain - 8/10. No UC's

## 2022-10-06 NOTE — ED Triage Notes (Addendum)
Patient reports malodorous cloudy urine with dysuria and low back pain for several days , diagnosed with UTI but did not start her antibiotic . No vaginal bleeding or abdominal contractions .

## 2022-10-06 NOTE — H&P (Signed)
OBSC ADMISSION HISTORY AND PHYSICAL NOTE  Jennifer [name] is a 32 y.o. female [phone] with IUP at [redacted]w[redacted]d by LMP presenting for Pyelonephritis.  She endorses monthly UTIs in her current pregnancy. She experienced new onset right flank pain and fever last night 10/05/2022. T max at home was 101.7 .   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 - Chlamydia infection in pregnancy - Cervical dysplasia  Past Medical History: Past Medical History:  Diagnosis Date   BV (bacterial vaginosis)    Chlamydia    Nausea and vomiting in pregnancy prior to [redacted] weeks gestation 12/04/2012    Past Surgical History: Past Surgical History:  Procedure Laterality Date   CERVICAL BIOPSY  W/ LOOP ELECTRODE EXCISION     COLPOSCOPY  09/03/2019       LEEP  10/22/2019        Obstetrical History: OB History     Gravida  4   Para  3   Term  3   Preterm      AB      Living  3      SAB      IAB      Ectopic      Multiple      Live Births  3           Social History: Social History   Socioeconomic History   Marital status: Single    Spouse name: Not on file   Number of children: Not on file   Years of education: Not on file   Highest education level: Some college, no degree  Occupational History   Not on file  Tobacco Use   Smoking status: Former    Types: Cigarettes    Quit date: 07/16/2010    Years since quitting: 12.2   Smokeless tobacco: Never  Vaping Use   Vaping Use: Never used  Substance and Sexual Activity   Alcohol use: Not Currently    Comment: Occassional, prior to pregnancy   Drug use: No   Sexual activity: Yes    Partners: Male    Birth control/protection: None  Other Topics Concern   Not on file  Social History Narrative   Not on file   Social Determinants of Health   Financial Resource Strain:  Not on file  Food Insecurity: No Food Insecurity (09/22/2021)   Hunger Vital Sign    Worried About Running Out of Food in the Last Year: Never true    Ran Out of Food in the Last Year: Never true  Transportation Needs: No Transportation Needs (09/22/2021)   PRAPARE - Administrator, Civil Service (Medical): No    Lack of Transportation (Non-Medical): No  Physical Activity: Not on file  Stress: Not on file  Social Connections: Not on file    Family History: Family History  Problem Relation Age of Onset   Hypertension Mother    Breast cancer Maternal Aunt     Allergies: No Known Allergies  Medications Prior to Admission  Medication Sig Dispense Refill Last Dose   metroNIDAZOLE (FLAGYL) 500 MG tablet Take 1 tablet (500 mg total) by mouth 2 (two) times daily. (Patient not taking: Reported on 10/02/2022) 14 tablet 0    amoxicillin-clavulanate (AUGMENTIN) 875-125 MG tablet Take 1 tablet by mouth 2 (two) times daily. (Patient not taking: Reported on 10/02/2022) 14 tablet 0  Blood Pressure Monitoring (BLOOD PRESSURE KIT) DEVI 1 kit by Does not apply route once a week. 1 each 0    cefadroxil (DURICEF) 500 MG capsule Take 1 capsule (500 mg total) by mouth 2 (two) times daily. (Patient not taking: Reported on 10/02/2022) 14 capsule 0    Prenatal Vit-Fe Fumarate-FA (MULTIVITAMIN-PRENATAL) 27-0.8 MG TABS tablet Take 1 tablet by mouth daily at 12 noon.        Review of Systems  All systems reviewed and negative except as stated in HPI  Physical Exam Blood pressure (!) 110/54, pulse 87, temperature 97.8 F (36.6 C), temperature source Oral, resp. rate 16, height 5' 5.5" (1.664 m), weight 69.4 kg, last menstrual period 05/16/2022, SpO2 100 %. General appearance: alert, oriented Lungs: normal respiratory effort Heart: regular rate Abdomen: soft, non-tender; gravid, no CVAT Extremities: No calf swelling or tenderness   FHT 156 by Doppler Uterine activity: none    Prenatal  labs: ABO, Rh: --/--/PENDING (06/07 2241) Antibody: PENDING (06/07 2241) Rubella: 4.93 (03/14 0916) RPR: Non Reactive (03/14 0916)  HBsAg: Negative (03/14 0916)  HIV: Non Reactive (03/14 0916)  GC/Chlamydia: Neg (TOC  09/20/2022) GBS:   Unknown Genetic screening:  Low risk Anatomy US: WNL  Prenatal Transfer Tool  Maternal Diabetes: No Genetic Screening: Normal Maternal Ultrasounds/Referrals: Normal Fetal Ultrasounds or other Referrals:  None Maternal Substance Abuse:  No Significant Maternal Medications:  None Significant Maternal Lab Results: None  Results for orders placed or performed during the hospital encounter of 10/06/22 (from the past 24 hour(s))  Urinalysis, Routine w reflex microscopic -Urine, Clean Catch   Collection Time: 10/06/22  9:22 PM  Result Value Ref Range   Color, Urine YELLOW YELLOW   APPearance CLOUDY (A) CLEAR   Specific Gravity, Urine 1.017 1.005 - 1.030   pH 6.0 5.0 - 8.0   Glucose, UA NEGATIVE NEGATIVE mg/dL   Hgb urine dipstick NEGATIVE NEGATIVE   Bilirubin Urine NEGATIVE NEGATIVE   Ketones, ur NEGATIVE NEGATIVE mg/dL   Protein, ur 161 (A) NEGATIVE mg/dL   Nitrite POSITIVE (A) NEGATIVE   Leukocytes,Ua LARGE (A) NEGATIVE   RBC / HPF 6-10 0 - 5 RBC/hpf   WBC, UA >50 0 - 5 WBC/hpf   Bacteria, UA MANY (A) NONE SEEN   Squamous Epithelial / HPF 0-5 0 - 5 /HPF   WBC Clumps PRESENT    Mucus PRESENT    Non Squamous Epithelial 0-5 (A) NONE SEEN  Pregnancy, urine   Collection Time: 10/06/22  9:22 PM  Result Value Ref Range   Preg Test, Ur POSITIVE (A) NEGATIVE  CBC with Differential   Collection Time: 10/06/22  9:30 PM  Result Value Ref Range   WBC 11.1 (H) 4.0 - 10.5 K/uL   RBC 3.56 (L) 3.87 - 5.11 MIL/uL   Hemoglobin 11.4 (L) 12.0 - 15.0 g/dL   HCT 09.6 (L) 04.5 - 40.9 %   MCV 95.2 80.0 - 100.0 fL   MCH 32.0 26.0 - 34.0 pg   MCHC 33.6 30.0 - 36.0 g/dL   RDW 81.1 91.4 - 78.2 %   Platelets 215 150 - 400 K/uL   nRBC 0.0 0.0 - 0.2 %    Neutrophils Relative % 79 %   Neutro Abs 8.6 (H) 1.7 - 7.7 K/uL   Lymphocytes Relative 14 %   Lymphs Abs 1.6 0.7 - 4.0 K/uL   Monocytes Relative 6 %   Monocytes Absolute 0.7 0.1 - 1.0 K/uL   Eosinophils Relative 1 %  Eosinophils Absolute 0.1 0.0 - 0.5 K/uL   Basophils Relative 0 %   Basophils Absolute 0.0 0.0 - 0.1 K/uL   Immature Granulocytes 0 %   Abs Immature Granulocytes 0.03 0.00 - 0.07 K/uL  Comprehensive metabolic panel   Collection Time: 10/06/22  9:30 PM  Result Value Ref Range   Sodium 133 (L) 135 - 145 mmol/L   Potassium 3.2 (L) 3.5 - 5.1 mmol/L   Chloride 103 98 - 111 mmol/L   CO2 21 (L) 22 - 32 mmol/L   Glucose, Bld 110 (H) 70 - 99 mg/dL   BUN 11 6 - 20 mg/dL   Creatinine, Ser 1.61 0.44 - 1.00 mg/dL   Calcium 8.8 (L) 8.9 - 10.3 mg/dL   Total Protein 6.4 (L) 6.5 - 8.1 g/dL   Albumin 2.9 (L) 3.5 - 5.0 g/dL   AST 19 15 - 41 U/L   ALT 17 0 - 44 U/L   Alkaline Phosphatase 87 38 - 126 U/L   Total Bilirubin 0.4 0.3 - 1.2 mg/dL   GFR, Estimated >09 >60 mL/min   Anion gap 9 5 - 15  Type and screen [facility]   Collection Time: 10/06/22 10:41 PM  Result Value Ref Range   ABO/RH(D) PENDING    Antibody Screen PENDING    Sample Expiration      10/09/2022,2359 Performed at [facility], [address] [place], [place] [address]     Patient Active Problem List   Diagnosis Date Noted   Chlamydia infection affecting pregnancy 10/06/2022   Pyelonephritis affecting pregnancy in second trimester 10/06/2022   UTI (urinary tract infection) during pregnancy 09/20/2022   Supervision of other normal pregnancy, antepartum 07/13/2022   Cervical dysplasia 09/03/2019    Assessment: - Jennifer [name] is a 32 y.o. [phone] at [redacted]w[redacted]d admitted for Pyelonephritis - Normotensive on admission - Per Dr. Donavan Foil, admit to [facility]  Calvert Cantor, MSA, MSN, CNM 10/06/2022, 11:06 PM

## 2022-10-06 NOTE — ED Notes (Signed)
Report given to RN at MAU on patient's condition and transfer .

## 2022-10-07 ENCOUNTER — Encounter (HOSPITAL_COMMUNITY): Payer: Self-pay | Admitting: Obstetrics and Gynecology

## 2022-10-07 DIAGNOSIS — O2302 Infections of kidney in pregnancy, second trimester: Principal | ICD-10-CM

## 2022-10-07 DIAGNOSIS — Z3A20 20 weeks gestation of pregnancy: Secondary | ICD-10-CM

## 2022-10-07 LAB — AFP, SERUM, OPEN SPINA BIFIDA
AFP MoM: 0.82
AFP Value: 48.6 ng/mL
Gest. Age on Collection Date: 20.2 weeks
Maternal Age At EDD: 30.8 yr
OSBR Risk 1 IN: 10000
Test Results:: NEGATIVE
Weight: 151 [lb_av]

## 2022-10-07 LAB — CBC
HCT: 30.2 % — ABNORMAL LOW (ref 36.0–46.0)
Hemoglobin: 10.2 g/dL — ABNORMAL LOW (ref 12.0–15.0)
MCH: 31 pg (ref 26.0–34.0)
MCHC: 33.8 g/dL (ref 30.0–36.0)
MCV: 91.8 fL (ref 80.0–100.0)
Platelets: 196 10*3/uL (ref 150–400)
RBC: 3.29 MIL/uL — ABNORMAL LOW (ref 3.87–5.11)
RDW: 13.5 % (ref 11.5–15.5)
WBC: 9.9 10*3/uL (ref 4.0–10.5)
nRBC: 0 % (ref 0.0–0.2)

## 2022-10-07 LAB — TYPE AND SCREEN: Antibody Screen: NEGATIVE

## 2022-10-07 LAB — CULTURE, OB URINE

## 2022-10-07 LAB — CULTURE, BLOOD (ROUTINE X 2): Culture: NO GROWTH

## 2022-10-07 MED ORDER — POTASSIUM CHLORIDE CRYS ER 20 MEQ PO TBCR
40.0000 meq | EXTENDED_RELEASE_TABLET | Freq: Once | ORAL | Status: AC
  Administered 2022-10-07: 40 meq via ORAL
  Filled 2022-10-07: qty 2

## 2022-10-07 NOTE — Progress Notes (Signed)
FACULTY PRACTICE ANTEPARTUM PROGRESS NOTE  Jennifer [name] is a 32 y.o. [phone] at [redacted]w[redacted]d who is admitted for mild pyelonephritis.  Estimated Date of Delivery: 02/20/23 Fetal presentation is unsure.  Length of Stay:  1 Days. Admitted 10/06/2022  Subjective: Pt states no fever/chills since she was admitted.  Feels slightly better since admission.  No nausea/vomiting.  Right flank pain still present, but improved from previous. Patient reports normal fetal movement.  She denies uterine contractions, denies bleeding and leaking of fluid per vagina.  Vitals:  Blood pressure (!) 102/50, pulse 74, temperature 98 F (36.7 C), temperature source Oral, resp. rate 16, height 5' 5.5" (1.664 m), weight 69.4 kg, last menstrual period 05/16/2022, SpO2 100 %. Physical Examination: CONSTITUTIONAL: Well-developed, well-nourished female in no acute distress.  HENT:  Normocephalic, atraumatic, External right and left ear normal. Oropharynx is clear and moist EYES: Conjunctivae and EOM are normal.  NECK: Normal range of motion, supple, no masses. SKIN: Skin is warm and dry. No rash noted. Not diaphoretic. No erythema. No pallor. NEUROLGIC: Alert and oriented to person, place, and time. Normal reflexes, muscle tone coordination. No cranial nerve deficit noted. PSYCHIATRIC: Normal mood and affect. Normal behavior. Normal judgment and thought content. CARDIOVASCULAR: Normal heart rate noted, regular rhythm RESPIRATORY: Effort and breath sounds normal, no problems with respiration noted MUSCULOSKELETAL: Normal range of motion. No edema .  Positive right flank pain with palpation ABDOMEN: Soft, nontender, nondistended, gravid. CERVIX: deferred  Fetal monitoring: FHR: 149 bpm,   Results for orders placed or performed during the hospital encounter of 10/06/22 (from the past 48 hour(s))  Urinalysis, Routine w reflex microscopic -Urine, Clean Catch     Status: Abnormal   Collection Time: 10/06/22  9:22 PM  Result  Value Ref Range   Color, Urine YELLOW YELLOW   APPearance CLOUDY (A) CLEAR   Specific Gravity, Urine 1.017 1.005 - 1.030   pH 6.0 5.0 - 8.0   Glucose, UA NEGATIVE NEGATIVE mg/dL   Hgb urine dipstick NEGATIVE NEGATIVE   Bilirubin Urine NEGATIVE NEGATIVE   Ketones, ur NEGATIVE NEGATIVE mg/dL   Protein, ur 295 (A) NEGATIVE mg/dL   Nitrite POSITIVE (A) NEGATIVE   Leukocytes,Ua LARGE (A) NEGATIVE   RBC / HPF 6-10 0 - 5 RBC/hpf   WBC, UA >50 0 - 5 WBC/hpf   Bacteria, UA MANY (A) NONE SEEN   Squamous Epithelial / HPF 0-5 0 - 5 /HPF   WBC Clumps PRESENT    Mucus PRESENT    Non Squamous Epithelial 0-5 (A) NONE SEEN    Comment: Performed at [facility], [address], [place], [place] [address]  Pregnancy, urine     Status: Abnormal   Collection Time: 10/06/22  9:22 PM  Result Value Ref Range   Preg Test, Ur POSITIVE (A) NEGATIVE    Comment:        THE SENSITIVITY OF THIS METHODOLOGY IS >20 mIU/mL. Performed at [facility], [address], [place], [place] [address]   CBC with Differential     Status: Abnormal   Collection Time: 10/06/22  9:30 PM  Result Value Ref Range   WBC 11.1 (H) 4.0 - 10.5 K/uL   RBC 3.56 (L) 3.87 - 5.11 MIL/uL   Hemoglobin 11.4 (L) 12.0 - 15.0 g/dL   HCT 46.9 (L) 62.9 - 52.8 %   MCV 95.2 80.0 - 100.0 fL   MCH 32.0 26.0 - 34.0 pg   MCHC 33.6 30.0 - 36.0 g/dL  RDW 13.4 11.5 - 15.5 %   Platelets 215 150 - 400 K/uL   nRBC 0.0 0.0 - 0.2 %   Neutrophils Relative % 79 %   Neutro Abs 8.6 (H) 1.7 - 7.7 K/uL   Lymphocytes Relative 14 %   Lymphs Abs 1.6 0.7 - 4.0 K/uL   Monocytes Relative 6 %   Monocytes Absolute 0.7 0.1 - 1.0 K/uL   Eosinophils Relative 1 %   Eosinophils Absolute 0.1 0.0 - 0.5 K/uL   Basophils Relative 0 %   Basophils Absolute 0.0 0.0 - 0.1 K/uL   Immature Granulocytes 0 %   Abs Immature Granulocytes 0.03 0.00 - 0.07 K/uL    Comment: Performed at [facility], [address] [place], [place] [address]  Comprehensive  metabolic panel     Status: Abnormal   Collection Time: 10/06/22  9:30 PM  Result Value Ref Range   Sodium 133 (L) 135 - 145 mmol/L   Potassium 3.2 (L) 3.5 - 5.1 mmol/L   Chloride 103 98 - 111 mmol/L   CO2 21 (L) 22 - 32 mmol/L   Glucose, Bld 110 (H) 70 - 99 mg/dL    Comment: Glucose reference range applies only to samples taken after fasting for at least 8 hours.   BUN 11 6 - 20 mg/dL   Creatinine, Ser 6.04 0.44 - 1.00 mg/dL   Calcium 8.8 (L) 8.9 - 10.3 mg/dL   Total Protein 6.4 (L) 6.5 - 8.1 g/dL   Albumin 2.9 (L) 3.5 - 5.0 g/dL   AST 19 15 - 41 U/L   ALT 17 0 - 44 U/L   Alkaline Phosphatase 87 38 - 126 U/L   Total Bilirubin 0.4 0.3 - 1.2 mg/dL   GFR, Estimated >54 >09 mL/min    Comment: (NOTE) Calculated using the CKD-EPI Creatinine Equation (2021)    Anion gap 9 5 - 15    Comment: Performed at [facility], [address], [place], [place] [address]  Type and screen [facility]     Status: None   Collection Time: 10/06/22 10:41 PM  Result Value Ref Range   ABO/RH(D) O POS    Antibody Screen NEG    Sample Expiration      10/09/2022,2359 Performed at [facility], [address] [place], [place] [address]   Culture, blood (Routine X 2)     Status: None (Preliminary result)   Collection Time: 10/06/22 11:07 PM   Specimen: BLOOD  Result Value Ref Range   Specimen Description BLOOD BLOOD RIGHT ARM    Special Requests      BOTTLES DRAWN AEROBIC AND ANAEROBIC Blood Culture adequate volume   Culture      NO GROWTH < 12 HOURS Performed at [facility], [address] [place], [place] [address]    Report Status PENDING   Culture, blood (Routine X 2)     Status: None (Preliminary result)   Collection Time: 10/06/22 11:07 PM   Specimen: BLOOD  Result Value Ref Range   Specimen Description BLOOD BLOOD LEFT ARM    Special Requests      BOTTLES DRAWN AEROBIC AND ANAEROBIC Blood Culture adequate volume   Culture      NO GROWTH < 12  HOURS Performed at [facility], [address], [place], [place] [address]    Report Status PENDING   CBC     Status: Abnormal   Collection Time: 10/07/22  4:26 AM  Result Value Ref Range   WBC 9.9 4.0 - 10.5 K/uL   RBC 3.29 (L) 3.87 - 5.11 MIL/uL   Hemoglobin 10.2 (L) 12.0 - 15.0 g/dL   HCT 16.1 (L) 09.6 - 04.5 %   MCV 91.8 80.0 - 100.0 fL   MCH 31.0 26.0 - 34.0 pg   MCHC 33.8 30.0 - 36.0 g/dL   RDW 40.9 81.1 - 91.4 %   Platelets 196 150 - 400 K/uL   nRBC 0.0 0.0 - 0.2 %    Comment: Performed at [facility], [address], [place], [place] [address]    I have reviewed the patient's current medications.  ASSESSMENT: Principal Problem:   Pyelonephritis affecting pregnancy in second trimester Active Problems:   Chlamydia infection affecting pregnancy   PLAN: Pt had previous urine culture with e.coli which should be susceptible to the rocephin.  Pt picked up the previous augmentin, but never took it. WBC improved No fever noted. Pt still has some CVA tenderness.  Would get at least 1 more dose of IV antibiotics and reassess on 10/08/22 for possible discharge.   Continue routine antenatal care.   Mariel Aloe, MD [facility] Faculty Attending, Center for [facility] 10/07/2022 7:45 AM

## 2022-10-08 LAB — CULTURE, OB URINE: Culture: >=100000 — AB

## 2022-10-08 LAB — CULTURE, BLOOD (ROUTINE X 2)

## 2022-10-08 MED ORDER — AMOXICILLIN-POT CLAVULANATE 875-125 MG PO TABS: 1.0000 | ORAL_TABLET | Freq: Two times a day (BID) | ORAL | 0 refills | Status: AC

## 2022-10-08 MED ORDER — CEPHALEXIN 250 MG PO CAPS: 250.0000 mg | ORAL_CAPSULE | Freq: Every day | ORAL | 1 refills | Status: DC

## 2022-10-08 NOTE — Discharge Summary (Signed)
Antenatal Physician Discharge Summary  Patient ID: ADRIHANNA KARON MRN: [id number] DOB/AGE: 1991-03-05 32 y.o.  Admit date: 10/06/2022 Discharge date: 10/08/2022  Admission Diagnoses: Pyelonephritis affecting pregnancy in second trimester [O23.02] SLIUP at [redacted]w[redacted]d  Discharge Diagnoses:  As above SLIUP [redacted]w[redacted]d  Prenatal Procedures: none Consults: none  Hospital Course:  MCKENZYE MACKOWSKI is a 32 y.o. [phone] with IUP at [redacted]w[redacted]d with recurrent UTI admitted for pyelonephritis. Patient had known UTI and had not started her antibiotics due to confusion about her prescription. She presented with R flank pain and fevers so was admitted to [facility] for IV antibiotics. The patient rapidly improved after 48 hours of IV ceftriaxone. On day of discharge, she reported resolution of her flank pain, fevers and chills. She was tolerating a regular diet and ambulating without issue. She was transition to augmentin 875 BID x 7d based of sensitivities from her prior urine culture. Speciation & sensitivities were still pending on her urine culture from admission. After completion of treatment with augmentin she will transition to PO keflex 250mg  daily for suppression. She has an ROB scheduled 7/15, but was instructed to call for a sooner appointment if her symptoms return. She expressed good understanding of the plan and was discharged home in good condition.   Discharge Exam: Temp:  [98 F (36.7 C)-99 F (37.2 C)] 98.4 F (36.9 C) (06/09 1120) Pulse Rate:  [79-96] 79 (06/09 1120) Resp:  [16-18] 18 (06/09 1120) BP: (94-116)/(45-66) 116/66 (06/09 1120) SpO2:  [97 %-100 %] 100 % (06/09 1120) Physical Examination: CONSTITUTIONAL: Well-developed, well-nourished female in no acute distress.  NEUROLOGIC: Alert and oriented to person, place, and time.  CARDIOVASCULAR: Normal heart rate noted RESPIRATORY: Effort normal, no problems with respiration noted ABDOMEN: Soft, nontender, nondistended, gravid. No CVA  tenderness  Significant Diagnostic Studies:  Results for orders placed or performed during the hospital encounter of 10/06/22 (from the past 168 hour(s))  Culture, OB Urine   Collection Time: 10/06/22  9:22 PM   Specimen: Urine, Random  Result Value Ref Range   Specimen Description URINE, RANDOM    Special Requests NONE    Culture (A)     >=100,000 COLONIES/mL GRAM NEGATIVE RODS CULTURE REINCUBATED FOR BETTER GROWTH SUSCEPTIBILITIES TO FOLLOW Performed at [facility], [address] [place], [place] [address]    Report Status PENDING   Urinalysis, Routine w reflex microscopic -Urine, Clean Catch   Collection Time: 10/06/22  9:22 PM  Result Value Ref Range   Color, Urine YELLOW YELLOW   APPearance CLOUDY (A) CLEAR   Specific Gravity, Urine 1.017 1.005 - 1.030   pH 6.0 5.0 - 8.0   Glucose, UA NEGATIVE NEGATIVE mg/dL   Hgb urine dipstick NEGATIVE NEGATIVE   Bilirubin Urine NEGATIVE NEGATIVE   Ketones, ur NEGATIVE NEGATIVE mg/dL   Protein, ur 244 (A) NEGATIVE mg/dL   Nitrite POSITIVE (A) NEGATIVE   Leukocytes,Ua LARGE (A) NEGATIVE   RBC / HPF 6-10 0 - 5 RBC/hpf   WBC, UA >50 0 - 5 WBC/hpf   Bacteria, UA MANY (A) NONE SEEN   Squamous Epithelial / HPF 0-5 0 - 5 /HPF   WBC Clumps PRESENT    Mucus PRESENT    Non Squamous Epithelial 0-5 (A) NONE SEEN  Pregnancy, urine   Collection Time: 10/06/22  9:22 PM  Result Value Ref Range   Preg Test, Ur POSITIVE (A) NEGATIVE  CBC with Differential   Collection Time: 10/06/22  9:30 PM  Result Value Ref Range  WBC 11.1 (H) 4.0 - 10.5 K/uL   RBC 3.56 (L) 3.87 - 5.11 MIL/uL   Hemoglobin 11.4 (L) 12.0 - 15.0 g/dL   HCT 40.9 (L) 81.1 - 91.4 %   MCV 95.2 80.0 - 100.0 fL   MCH 32.0 26.0 - 34.0 pg   MCHC 33.6 30.0 - 36.0 g/dL   RDW 78.2 95.6 - 21.3 %   Platelets 215 150 - 400 K/uL   nRBC 0.0 0.0 - 0.2 %   Neutrophils Relative % 79 %   Neutro Abs 8.6 (H) 1.7 - 7.7 K/uL   Lymphocytes Relative 14 %   Lymphs Abs 1.6 0.7 - 4.0 K/uL    Monocytes Relative 6 %   Monocytes Absolute 0.7 0.1 - 1.0 K/uL   Eosinophils Relative 1 %   Eosinophils Absolute 0.1 0.0 - 0.5 K/uL   Basophils Relative 0 %   Basophils Absolute 0.0 0.0 - 0.1 K/uL   Immature Granulocytes 0 %   Abs Immature Granulocytes 0.03 0.00 - 0.07 K/uL  Comprehensive metabolic panel   Collection Time: 10/06/22  9:30 PM  Result Value Ref Range   Sodium 133 (L) 135 - 145 mmol/L   Potassium 3.2 (L) 3.5 - 5.1 mmol/L   Chloride 103 98 - 111 mmol/L   CO2 21 (L) 22 - 32 mmol/L   Glucose, Bld 110 (H) 70 - 99 mg/dL   BUN 11 6 - 20 mg/dL   Creatinine, Ser 0.86 0.44 - 1.00 mg/dL   Calcium 8.8 (L) 8.9 - 10.3 mg/dL   Total Protein 6.4 (L) 6.5 - 8.1 g/dL   Albumin 2.9 (L) 3.5 - 5.0 g/dL   AST 19 15 - 41 U/L   ALT 17 0 - 44 U/L   Alkaline Phosphatase 87 38 - 126 U/L   Total Bilirubin 0.4 0.3 - 1.2 mg/dL   GFR, Estimated >57 >84 mL/min   Anion gap 9 5 - 15  Type and screen [facility]   Collection Time: 10/06/22 10:41 PM  Result Value Ref Range   ABO/RH(D) O POS    Antibody Screen NEG    Sample Expiration      10/09/2022,2359 Performed at [facility], [address], [place], [place] [address]   Culture, blood (Routine X 2)   Collection Time: 10/06/22 11:07 PM   Specimen: BLOOD RIGHT ARM  Result Value Ref Range   Specimen Description BLOOD RIGHT ARM    Special Requests      BOTTLES DRAWN AEROBIC AND ANAEROBIC Blood Culture adequate volume   Culture      NO GROWTH 2 DAYS Performed at [facility], [address], [place], [place] [address]    Report Status PENDING   Culture, blood (Routine X 2)   Collection Time: 10/06/22 11:07 PM   Specimen: BLOOD  Result Value Ref Range   Specimen Description BLOOD BLOOD LEFT ARM    Special Requests      BOTTLES DRAWN AEROBIC AND ANAEROBIC Blood Culture adequate volume   Culture      NO GROWTH 2 DAYS Performed at [facility], [address], [place], [place] [address]    Report  Status PENDING   CBC   Collection Time: 10/07/22  4:26 AM  Result Value Ref Range   WBC 9.9 4.0 - 10.5 K/uL   RBC 3.29 (L) 3.87 - 5.11 MIL/uL   Hemoglobin 10.2 (L) 12.0 - 15.0 g/dL   HCT 10.2 (L) 72.5 -  46.0 %   MCV 91.8 80.0 - 100.0 fL   MCH 31.0 26.0 - 34.0 pg   MCHC 33.8 30.0 - 36.0 g/dL   RDW 16.1 09.6 - 04.5 %   Platelets 196 150 - 400 K/uL   nRBC 0.0 0.0 - 0.2 %  Results for orders placed or performed in visit on 10/05/22 (from the past 168 hour(s))  AFP, Serum, Open Spina Bifida   Collection Time: 10/05/22 10:26 AM  Result Value Ref Range   Results Report    Test Results: *Screen Negative*    Gest. Age on Collection Date 20.2 weeks   Gestat. Age Based On LMP    M[age]D 28.8 yr   Race Caucasian    Weight 151 lbs   Insulin Dep Diabetes No    Multiple Gestation No    AFP Value 48.6 ng/mL   AFP MoM 0.82    OSBR Risk 1 IN 10,000    Interpretation Comment    Comment: Comment    [place] MFM OB COMP + 14 WK  Result Date: 10/02/2022 ----------------------------------------------------------------------  OBSTETRICS REPORT                       (Signed Final 10/02/2022 08:58 am) ---------------------------------------------------------------------- Patient Info  ID #:       [id number]                          D.O.B.:  10/23/1990 (31 yrs)  Name:       Gennaro Africa                Visit Date: 10/02/2022 07:38 am ---------------------------------------------------------------------- Performed By  Attending:        Ma Rings MD         Ref. Address:     [address]                                                             [address]                                                             [place]                                                             [address]  Performed By:     [name]          [facility]  Referred By:      [facility] ---------------------------------------------------------------------- Orders  #  Description                           Code        Ordered By  1  [place] MFM OB COMP + 14 WK                76805.01    Scheryl Darter  2  [place] MFM [facility] TRANSVAGINAL                [id number]     Scheryl Darter ----------------------------------------------------------------------  #  Order #                     Accession #                Episode #  1  [id number]                   [id number]                 [id number]  2  [id number]                   [id number]                 [id number] ---------------------------------------------------------------------- Indications  Fetal or maternal indication                   O35.8XX0  Previous cervical surgery (leep x 2)           O34.40  Encounter for antenatal screening for          Z36.3  malformations  [redacted] weeks gestation of pregnancy                Z3A.19  Encounter for cervical length                  Z36.86 ---------------------------------------------------------------------- Fetal Evaluation  Num Of Fetuses:         1  Fetal Heart Rate(bpm):  150  Cardiac Activity:       Observed  Presentation:           Cephalic  Placenta:               Posterior  P. Cord Insertion:      Visualized, central  Amniotic Fluid  AFI FV:      Within normal limits  Largest Pocket(cm)                              3.97 ---------------------------------------------------------------------- Biometry  BPD:      44.3  mm     G. Age:  19w 3d         30  %    CI:        73.17   %    70 - 86                                                          FL/HC:      19.0   %    16.8 - 19.8  HC:      164.6  mm     G. Age:  19w 1d         15  %    HC/AC:      1.11        1.09 - 1.39  AC:      147.9  mm     G. Age:  20w 0d         52  %     FL/BPD:     70.4   %  FL:       31.2  mm     G. Age:  19w 5d         36  %    FL/AC:      21.1   %    20 - 24  HUM:      30.1  mm     G. Age:  20w 0d         54  %  CER:      19.9  mm     G. Age:  19w 2d         48  %  NFT:       3.8  mm  LV:          7  mm  CM:        3.3  mm  Est. FW:     314  gm    0 lb 11 oz      42  % ---------------------------------------------------------------------- OB History  Blood Type:   O+  Gravidity:    4         Term:   3  Living:       3 ---------------------------------------------------------------------- Gestational Age  LMP:           19w 6d        Date:  05/16/22                  EDD:   02/20/23  U/S Today:     19w 4d                                        EDD:   02/22/23  Best:          19w 6d     Det. By:  LMP  (05/16/22)          EDD:   02/20/23 ---------------------------------------------------------------------- Anatomy  Cranium:  Appears normal         Aortic Arch:            Appears normal  Cavum:                 Appears normal         Ductal Arch:            Appears normal  Ventricles:            Appears normal         Diaphragm:              Appears normal  Choroid Plexus:        Appears normal         Stomach:                Appears normal, left                                                                        sided  Cerebellum:            Appears normal         Abdomen:                Appears normal  Posterior Fossa:       Appears normal         Abdominal Wall:         Appears nml (cord                                                                        insert, abd wall)  Nuchal Fold:           Appears normal         Cord Vessels:           Appears normal (3                                                                        vessel cord)  Face:                  Appears normal         Kidneys:                Appear normal                         (orbits and profile)  Lips:                  Appears normal         Bladder:                Appears  normal  Thoracic:  Appears normal         Spine:                  Appears normal  Heart:                 Appears normal         Upper Extremities:      Appears normal                         (4CH, axis, and                         situs)  RVOT:                  Appears normal         Lower Extremities:      Appears normal  LVOT:                  Appears normal  Other:  Heels, feet, open hands, and 5th digit visualized. Nasal bone          visualized. Fetus appears to be female. ---------------------------------------------------------------------- Cervix Uterus Adnexa  Cervix  Length:            4.2  cm.  Normal appearance by transvaginal scan  Uterus  No abnormality visualized.  Right Ovary  Within normal limits.  Left Ovary  Within normal limits.  Adnexa  No abnormality visualized ---------------------------------------------------------------------- Comments  This patient was seen for a detailed fetal anatomy scan as  she has had two prior LEEPs with the most recent in 2021.  The patient's pregnancy history includes 3 prior full-term  deliveries at between 39 to 41 weeks.  These 3 pregnancies  occurred prior to her second LEEP.  She denies any significant past medical history and denies  any problems in her current pregnancy.  She had a cell free DNA test earlier in her pregnancy which  indicated a low risk for trisomy 107, 76, and 13. A female fetus  is predicted.  She was informed that the fetal growth and amniotic fluid  level were appropriate for her gestational age.  There were no obvious fetal anomalies noted on today's  ultrasound exam.  The patient was informed that anomalies may be missed due  to technical limitations. If the fetus is in a suboptimal position  or maternal habitus is increased, visualization of the fetus in  the maternal uterus may be impaired.  Due to her prior LEEP, a transvaginal ultrasound performed  today showed a cervical length of 4.2 cm long without any  signs of  funneling.  She was reassured that based on her  past pregnancy history and the normal cervical length  obtained today, her overall risk for a preterm birth is low.  Follow-up as indicated. ----------------------------------------------------------------------                  Ma Rings, MD Electronically Signed Final Report   10/02/2022 08:58 am ----------------------------------------------------------------------  [place] MFM OB Transvaginal  Result Date: 10/02/2022 ----------------------------------------------------------------------  OBSTETRICS REPORT                       (Signed Final 10/02/2022 08:58 am) ---------------------------------------------------------------------- Patient Info  ID #:       [id number]  D.O.B.:  06-02-1990 (31 yrs)  Name:       YOMARI EASLER                Visit Date: 10/02/2022 07:38 am ---------------------------------------------------------------------- Performed By  Attending:        Ma Rings MD         Ref. Address:     [address]                                                             [address]                                                             [place]                                                             [address]  Performed By:     [name]          [facility]  Referred By:      [facility] ---------------------------------------------------------------------- Orders  #  Description                           Code        Ordered By  1  [place] MFM OB COMP + 14 WK  65784.69    Scheryl Darter  2  [place] MFM [facility] TRANSVAGINAL                62952.8     Scheryl Darter  ----------------------------------------------------------------------  #  Order #                     Accession #                Episode #  1  [id number]                   [id number]                 [id number]  2  [id number]                   [id number]                 [id number] ---------------------------------------------------------------------- Indications  Fetal or maternal indication                   O35.8XX0  Previous cervical surgery (leep x 2)           O34.40  Encounter for antenatal screening for          Z36.3  malformations  [redacted] weeks gestation of pregnancy                Z3A.19  Encounter for cervical length                  Z36.86 ---------------------------------------------------------------------- Fetal Evaluation  Num Of Fetuses:         1  Fetal Heart Rate(bpm):  150  Cardiac Activity:       Observed  Presentation:           Cephalic  Placenta:               Posterior  P. Cord Insertion:      Visualized, central  Amniotic Fluid  AFI FV:      Within normal limits                              Largest Pocket(cm)                              3.97 ---------------------------------------------------------------------- Biometry  BPD:      44.3  mm     G. Age:  19w 3d         30  %    CI:        73.17   %    70 - 86                                                          FL/HC:      19.0   %    16.8 - 19.8  HC:      164.6  mm     G. Age:  19w 1d         15  %    HC/AC:      1.11        1.09 - 1.39  AC:      147.9  mm     G. Age:  20w 0d         52  %    FL/BPD:     70.4   %  FL:       31.2  mm     G. Age:  19w 5d         36  %    FL/AC:      21.1   %    20 - 24  HUM:      30.1  mm     G. Age:  20w 0d         54  %  CER:      19.9  mm     G. Age:  19w 2d         48  %  NFT:       3.8  mm  LV:          7  mm  CM:        3.3  mm  Est. FW:     314  gm    0 lb 11 oz      42  % ---------------------------------------------------------------------- OB History  Blood Type:   O+  Gravidity:    4         Term:   3   Living:       3 ---------------------------------------------------------------------- Gestational Age  LMP:           19w 6d        Date:  05/16/22                  EDD:   02/20/23  U/S Today:     19w 4d                                        EDD:   02/22/23  Best:          19w 6d     Det. By:  LMP  (05/16/22)          EDD:   02/20/23 ---------------------------------------------------------------------- Anatomy  Cranium:               Appears normal         Aortic Arch:            Appears normal  Cavum:                 Appears normal         Ductal Arch:            Appears normal  Ventricles:            Appears normal         Diaphragm:              Appears normal  Choroid Plexus:        Appears normal         Stomach:                Appears normal, left  sided  Cerebellum:            Appears normal         Abdomen:                Appears normal  Posterior Fossa:       Appears normal         Abdominal Wall:         Appears nml (cord                                                                        insert, abd wall)  Nuchal Fold:           Appears normal         Cord Vessels:           Appears normal (3                                                                        vessel cord)  Face:                  Appears normal         Kidneys:                Appear normal                         (orbits and profile)  Lips:                  Appears normal         Bladder:                Appears normal  Thoracic:              Appears normal         Spine:                  Appears normal  Heart:                 Appears normal         Upper Extremities:      Appears normal                         (4CH, axis, and                         situs)  RVOT:                  Appears normal         Lower Extremities:      Appears normal  LVOT:                  Appears normal  Other:  Heels, feet, open hands, and 5th digit visualized. Nasal bone           visualized. Fetus appears to  be female. ---------------------------------------------------------------------- Cervix Uterus Adnexa  Cervix  Length:            4.2  cm.  Normal appearance by transvaginal scan  Uterus  No abnormality visualized.  Right Ovary  Within normal limits.  Left Ovary  Within normal limits.  Adnexa  No abnormality visualized ---------------------------------------------------------------------- Comments  This patient was seen for a detailed fetal anatomy scan as  she has had two prior LEEPs with the most recent in 2021.  The patient's pregnancy history includes 3 prior full-term  deliveries at between 48 to 41 weeks.  These 3 pregnancies  occurred prior to her second LEEP.  She denies any significant past medical history and denies  any problems in her current pregnancy.  She had a cell free DNA test earlier in her pregnancy which  indicated a low risk for trisomy 22, 27, and 13. A female fetus  is predicted.  She was informed that the fetal growth and amniotic fluid  level were appropriate for her gestational age.  There were no obvious fetal anomalies noted on today's  ultrasound exam.  The patient was informed that anomalies may be missed due  to technical limitations. If the fetus is in a suboptimal position  or maternal habitus is increased, visualization of the fetus in  the maternal uterus may be impaired.  Due to her prior LEEP, a transvaginal ultrasound performed  today showed a cervical length of 4.2 cm long without any  signs of funneling.  She was reassured that based on her  past pregnancy history and the normal cervical length  obtained today, her overall risk for a preterm birth is low.  Follow-up as indicated. ----------------------------------------------------------------------                  Ma Rings, MD Electronically Signed Final Report   10/02/2022 08:58 am ----------------------------------------------------------------------   Future Appointments  Date Time  Provider Department Center  11/13/2022  9:55 AM Adam Phenix, MD [facility] None    Discharge Condition: Stable  Discharge disposition: 01-Home or Self Care       Discharge Instructions     Activity as tolerated - No restrictions   Complete by: As directed    Call MD for:   Complete by: As directed    Persistent/severe contractions, vaginal bleeding, leakage of fluid   Call MD for:  persistant nausea and vomiting   Complete by: As directed    Call MD for:  severe uncontrolled pain   Complete by: As directed    Call MD for:  temperature >100.4   Complete by: As directed    Diet general   Complete by: As directed    May shower / Bathe   Complete by: As directed       Allergies as of 10/08/2022   No Known Allergies      Medication List     STOP taking these medications    cefadroxil 500 MG capsule Commonly known as: DURICEF   metroNIDAZOLE 500 MG tablet Commonly known as: FLAGYL       TAKE these medications    amoxicillin-clavulanate 875-125 MG tablet Commonly known as: AUGMENTIN Take 1 tablet by mouth 2 (two) times daily for 7 days.   Blood Pressure Kit Devi 1 kit by Does not apply route once a week.   cephALEXin 250 MG capsule Commonly known as: Keflex Take 1 capsule (250 mg total) by mouth at bedtime. Start taking on: October 15, 2022  multivitamin-prenatal 27-0.8 MG Tabs tablet Take 1 tablet by mouth daily at 12 noon.        Follow-up Information     [facility] Follow up on 11/13/2022.   Specialty: Obstetrics and Gynecology Why: Please keep your scheduled OB appointment. If your symptoms return, please call to schedule a sooner appointment. Contact information: [address] [place] [address] [phone]                Total discharge time: 30 minutes   Signed: Lennart Pall M.D. 10/08/2022, 12:37 PM

## 2022-10-08 NOTE — Plan of Care (Signed)
Patient discharged in good condition.

## 2022-10-08 NOTE — Progress Notes (Signed)
Patient given discharge instructions,call office if unable to take meds, return to [facility] if symptoms worsen (pain and fever > 100.4). Patient verbalizes understanding.

## 2022-10-09 LAB — CULTURE, OB URINE

## 2022-10-11 LAB — CULTURE, BLOOD (ROUTINE X 2)
Culture: NO GROWTH
Special Requests: ADEQUATE
Special Requests: ADEQUATE

## 2022-10-17 ENCOUNTER — Telehealth: Payer: Self-pay | Admitting: *Deleted

## 2022-10-17 NOTE — Telephone Encounter (Signed)
LM on VM advising pt of Keflex RX filled for 14 days and ready for pickup at [facility]. Advised that [facility] staff will work to get approval for more than 14 days at one time. Call back number left.

## 2022-10-17 NOTE — Telephone Encounter (Signed)
Prior authorization for Keflex previously submitted for pt. [employer] responded stating no PA required. [facility] pharmacy and associate said PA is required for quantity greater that 14 at one time. RX is filled now for 14. Associate states that [facility] staff will have to call [employer] to get approval for 30 x 3 or 90 at one time. RN will follow up with [facility].

## 2022-10-18 ENCOUNTER — Telehealth: Payer: Self-pay | Admitting: *Deleted

## 2022-10-18 NOTE — Telephone Encounter (Signed)
TC to [employer] for [facility]. They reviewed the claim and indicate that pt can next fill Keflex on 10/28/22 for 30 days at 1 time. They indicate that the pharmacy will get a "soft rejection" for excessive duration but can override with a code. If they have difficulty they can call [phone].

## 2022-11-13 ENCOUNTER — Ambulatory Visit: Payer: Medicaid Other | Admitting: Obstetrics & Gynecology

## 2022-11-13 VITALS — BP 107/69 | HR 74 | Wt 157.1 lb

## 2022-11-13 DIAGNOSIS — O2302 Infections of kidney in pregnancy, second trimester: Secondary | ICD-10-CM

## 2022-11-13 DIAGNOSIS — Z3480 Encounter for supervision of other normal pregnancy, unspecified trimester: Secondary | ICD-10-CM

## 2022-11-13 NOTE — Progress Notes (Unsigned)
   PRENATAL VISIT NOTE  Subjective:  Jennifer [name] is a 32 y.o. [phone] at [redacted]w[redacted]d being seen today for ongoing prenatal care.  She is currently monitored for the following issues for this low-risk pregnancy and has Cervical dysplasia; Supervision of other normal pregnancy, antepartum; Chlamydia infection affecting pregnancy; and Pyelonephritis affecting pregnancy in second trimester on their problem list.  Patient reports occasional contractions.  Contractions: Not present. Vag. Bleeding: None.  Movement: Present. Denies leaking of fluid.   The following portions of the patient's history were reviewed and updated as appropriate: allergies, current medications, past family history, past medical history, past social history, past surgical history and problem list.   Objective:   Vitals:   11/13/22 1007  BP: 107/69  Pulse: 74  Weight: 157 lb 1.6 oz (71.3 kg)    Fetal Status: Fetal Heart Rate (bpm): 154   Movement: Present     General:  Alert, oriented and cooperative. Patient is in no acute distress.  Skin: Skin is warm and dry. No rash noted.   Cardiovascular: Normal heart rate noted  Respiratory: Normal respiratory effort, no problems with respiration noted  Abdomen: Soft, gravid, appropriate for gestational age.  Pain/Pressure: Absent     Pelvic: Cervical exam deferred        Extremities: Normal range of motion.  Edema: None  Mental Status: Normal mood and affect. Normal behavior. Normal judgment and thought content.   Assessment and Plan:  Pregnancy: G4P3003 at [redacted]w[redacted]d 1. Supervision of other normal pregnancy, antepartum Normal growth and cx length  2. Pyelonephritis affecting pregnancy in second trimester On suppression tx  Preterm labor symptoms and general obstetric precautions including but not limited to vaginal bleeding, contractions, leaking of fluid and fetal movement were reviewed in detail with the patient. Please refer to After Visit Summary for other counseling  recommendations.   Return in about 3 weeks (around 12/04/2022).  No future appointments.  Scheryl Darter, MD

## 2022-11-13 NOTE — Progress Notes (Signed)
Pt presents for ROB. No complaints reported to RN today.

## 2022-11-29 ENCOUNTER — Ambulatory Visit (INDEPENDENT_AMBULATORY_CARE_PROVIDER_SITE_OTHER): Payer: Medicaid Other | Admitting: Obstetrics and Gynecology

## 2022-11-29 ENCOUNTER — Other Ambulatory Visit (HOSPITAL_COMMUNITY)
Admission: RE | Admit: 2022-11-29 | Discharge: 2022-11-29 | Disposition: A | Discharge status: Home / Self Care | Payer: Medicaid Other | Source: Ambulatory Visit | Attending: Obstetrics and Gynecology | Admitting: Obstetrics and Gynecology

## 2022-11-29 VITALS — BP 109/63 | HR 85 | Wt 161.0 lb

## 2022-11-29 DIAGNOSIS — N898 Other specified noninflammatory disorders of vagina: Secondary | ICD-10-CM | POA: Insufficient documentation

## 2022-11-29 DIAGNOSIS — Z3A28 28 weeks gestation of pregnancy: Secondary | ICD-10-CM

## 2022-11-29 DIAGNOSIS — O2302 Infections of kidney in pregnancy, second trimester: Secondary | ICD-10-CM

## 2022-11-29 DIAGNOSIS — Z3480 Encounter for supervision of other normal pregnancy, unspecified trimester: Secondary | ICD-10-CM

## 2022-11-29 NOTE — Progress Notes (Signed)
   PRENATAL VISIT NOTE  Subjective:  Jennifer [name] is a 32 y.o. [phone] at [redacted]w[redacted]d being seen today for ongoing prenatal care.  She is currently monitored for the following issues for this low-risk pregnancy and has Cervical dysplasia; Supervision of other normal pregnancy, antepartum; Chlamydia infection affecting pregnancy; and Pyelonephritis affecting pregnancy in second trimester on their problem list.  Patient reports no complaints.  Contractions: Irritability. Vag. Bleeding: None.  Movement: Present. Denies leaking of fluid.   The following portions of the patient's history were reviewed and updated as appropriate: allergies, current medications, past family history, past medical history, past social history, past surgical history and problem list.   Objective:   Vitals:   11/29/22 1049  BP: 109/63  Pulse: 85  Weight: 161 lb (73 kg)    Fetal Status: Fetal Heart Rate (bpm): 150 Fundal Height: 28 cm Movement: Present     General:  Alert, oriented and cooperative. Patient is in no acute distress.  Skin: Skin is warm and dry. No rash noted.   Cardiovascular: Normal heart rate noted  Respiratory: Normal respiratory effort, no problems with respiration noted  Abdomen: Soft, gravid, appropriate for gestational age.  Pain/Pressure: Present     Pelvic: Cervical exam deferred        Extremities: Normal range of motion.  Edema: None  Mental Status: Normal mood and affect. Normal behavior. Normal judgment and thought content.   Assessment and Plan:  Pregnancy: G4P3003 at [redacted]w[redacted]d 1. Supervision of other normal pregnancy, antepartum BP and FHR normal  Feeling regular fetal movement  FH appropriate  2. Pyelonephritis affecting pregnancy in second trimester On suppression therapy, requesting swab and urine today, discussed continued suppression therapy   3. [redacted] weeks gestation of pregnancy Was not scheduled for GTT today, will schedule for next visit    Preterm labor symptoms and  general obstetric precautions including but not limited to vaginal bleeding, contractions, leaking of fluid and fetal movement were reviewed in detail with the patient. Please refer to After Visit Summary for other counseling recommendations.   Future Appointments  Date Time Provider Department Center  12/21/2022  8:30 AM [facility] None  12/21/2022  8:55 AM Warden Fillers, MD [facility] None     Albertine Grates, FNP

## 2022-12-01 MED ORDER — FLUCONAZOLE 150 MG PO TABS
150.0000 mg | ORAL_TABLET | Freq: Once | ORAL | 0 refills | Status: AC
Start: 2022-12-01 — End: 2022-12-01

## 2022-12-01 NOTE — Addendum Note (Signed)
Addended by: Sue Lush on: 12/01/2022 12:02 PM   Modules accepted: Orders

## 2022-12-13 ENCOUNTER — Other Ambulatory Visit: Payer: Medicaid Other

## 2022-12-21 ENCOUNTER — Other Ambulatory Visit (HOSPITAL_COMMUNITY)
Admission: RE | Admit: 2022-12-21 | Discharge: 2022-12-21 | Disposition: A | Discharge status: Home / Self Care | Payer: Medicaid Other | Source: Ambulatory Visit | Attending: Obstetrics and Gynecology | Admitting: Obstetrics and Gynecology

## 2022-12-21 ENCOUNTER — Ambulatory Visit (INDEPENDENT_AMBULATORY_CARE_PROVIDER_SITE_OTHER): Payer: Medicaid Other | Admitting: Obstetrics and Gynecology

## 2022-12-21 ENCOUNTER — Other Ambulatory Visit: Payer: Medicaid Other

## 2022-12-21 VITALS — BP 121/69 | HR 86 | Wt 165.1 lb

## 2022-12-21 DIAGNOSIS — O2302 Infections of kidney in pregnancy, second trimester: Secondary | ICD-10-CM | POA: Diagnosis not present

## 2022-12-21 DIAGNOSIS — Z3A31 31 weeks gestation of pregnancy: Secondary | ICD-10-CM

## 2022-12-21 DIAGNOSIS — Z3480 Encounter for supervision of other normal pregnancy, unspecified trimester: Secondary | ICD-10-CM | POA: Insufficient documentation

## 2022-12-21 NOTE — Progress Notes (Addendum)
   PRENATAL VISIT NOTE  Subjective:  Jennifer [name] is a 32 y.o. [phone] at 103w2d being seen today for ongoing prenatal care.  She is currently monitored for the following issues for this low-risk pregnancy and has Cervical dysplasia; Supervision of other normal pregnancy, antepartum; Chlamydia infection affecting pregnancy; and Pyelonephritis affecting pregnancy in second trimester on their problem list.  Patient doing well with no acute concerns today. She reports no complaints.  Contractions: Not present. Vag. Bleeding: None.  Movement: Present. Denies leaking of fluid.   Pt is interested in permanent sterilization. Patient counseled regarding bilateral tubal ligation. Reviewed that this is a permanent procedure and that she will not be able to have children after it is done. Reviewed risks of bilateral tubal ligation including infection, hemorrhage, damage to surrounding tissue and organs, risk of regret. Reviewed that bilateral tubal ligation is not 100% effective and she should take a pregnancy test if she believes for any reason she may be pregnant. Reviewed slightly increased risk of ectopic pregnancy and need to seek care if she becomes pregnant. She understands this is an elective procedure and again affirms her desire. Consent signed.    The following portions of the patient's history were reviewed and updated as appropriate: allergies, current medications, past family history, past medical history, past social history, past surgical history and problem list. Problem list updated.  Objective:   Vitals:   12/21/22 1010  BP: 121/69  Pulse: 86  Weight: 165 lb 1.6 oz (74.9 kg)    Fetal Status: Fetal Heart Rate (bpm): 131 Fundal Height: 31 cm Movement: Present     General:  Alert, oriented and cooperative. Patient is in no acute distress.  Skin: Skin is warm and dry. No rash noted.   Cardiovascular: Normal heart rate noted  Respiratory: Normal respiratory effort, no problems with  respiration noted  Abdomen: Soft, gravid, appropriate for gestational age.  Pain/Pressure: Absent     Pelvic: Cervical exam deferred        Extremities: Normal range of motion.  Edema: None  Mental Status:  Normal mood and affect. Normal behavior. Normal judgment and thought content.   Assessment and Plan:  Pregnancy: G4P3003 at [redacted]w[redacted]d  1. Supervision of other normal pregnancy, antepartum Continue routine prenatal care  - Glucose Tolerance, 2 Hours w/1 Hour - RPR - HIV antibody (with reflex) - CBC - Cervicovaginal ancillary only( [place]) Vaginal swab done at patient's request  2. [redacted] weeks gestation of pregnancy   3. Pyelonephritis affecting pregnancy in second trimester Pt continues keflex prophylaxis  Preterm labor symptoms and general obstetric precautions including but not limited to vaginal bleeding, contractions, leaking of fluid and fetal movement were reviewed in detail with the patient.  Please refer to After Visit Summary for other counseling recommendations.   Return in about 2 weeks (around 01/04/2023) for ROB, in person.   Mariel Aloe, MD Faculty Attending Center for [facility]

## 2022-12-21 NOTE — Progress Notes (Signed)
12  

## 2022-12-22 LAB — CERVICOVAGINAL ANCILLARY ONLY
Bacterial Vaginitis (gardnerella): NEGATIVE
Candida Glabrata: NEGATIVE
Candida Vaginitis: POSITIVE — AB
Chlamydia: POSITIVE — AB
Comment: NEGATIVE
Comment: NEGATIVE
Comment: NEGATIVE
Comment: NEGATIVE
Comment: NEGATIVE
Comment: NORMAL
Neisseria Gonorrhea: POSITIVE — AB
Trichomonas: NEGATIVE

## 2022-12-22 LAB — CBC
Hematocrit: 31.5 % — ABNORMAL LOW (ref 34.0–46.6)
Hemoglobin: 10.1 g/dL — ABNORMAL LOW (ref 11.1–15.9)
MCH: 29.5 pg (ref 26.6–33.0)
MCHC: 32.1 g/dL (ref 31.5–35.7)
MCV: 92 fL (ref 79–97)
Platelets: 184 10*3/uL (ref 150–450)
RBC: 3.42 x10E6/uL — ABNORMAL LOW (ref 3.77–5.28)
RDW: 12 % (ref 11.7–15.4)
WBC: 5.5 10*3/uL (ref 3.4–10.8)

## 2022-12-22 LAB — RPR: RPR Ser Ql: NONREACTIVE

## 2022-12-22 LAB — GLUCOSE TOLERANCE, 2 HOURS W/ 1HR
Glucose, 1 hour: 89 mg/dL (ref 70–179)
Glucose, 2 hour: 89 mg/dL (ref 70–152)
Glucose, Fasting: 85 mg/dL (ref 70–91)

## 2022-12-22 LAB — HIV ANTIBODY (ROUTINE TESTING W REFLEX): HIV Screen 4th Generation wRfx: NONREACTIVE

## 2022-12-25 ENCOUNTER — Encounter: Payer: Self-pay | Admitting: Obstetrics and Gynecology

## 2022-12-25 ENCOUNTER — Other Ambulatory Visit: Payer: Self-pay

## 2022-12-25 DIAGNOSIS — A749 Chlamydial infection, unspecified: Secondary | ICD-10-CM

## 2022-12-25 DIAGNOSIS — B379 Candidiasis, unspecified: Secondary | ICD-10-CM

## 2022-12-25 MED ORDER — AZITHROMYCIN 500 MG PO TABS
1000.0000 mg | ORAL_TABLET | Freq: Once | ORAL | 0 refills | Status: AC
Start: 2022-12-25 — End: 2022-12-25

## 2022-12-25 MED ORDER — TERCONAZOLE 0.4 % VA CREA
1.0000 | TOPICAL_CREAM | Freq: Every day | VAGINAL | 0 refills | Status: DC
Start: 2022-12-25 — End: 2023-02-17

## 2022-12-26 ENCOUNTER — Ambulatory Visit (INDEPENDENT_AMBULATORY_CARE_PROVIDER_SITE_OTHER): Payer: Medicaid Other | Admitting: Emergency Medicine

## 2022-12-26 VITALS — BP 108/68 | HR 78 | Wt 161.0 lb

## 2022-12-26 DIAGNOSIS — Z3A32 32 weeks gestation of pregnancy: Secondary | ICD-10-CM | POA: Diagnosis not present

## 2022-12-26 DIAGNOSIS — O98213 Gonorrhea complicating pregnancy, third trimester: Secondary | ICD-10-CM

## 2022-12-26 MED ORDER — CEFTRIAXONE SODIUM 500 MG IJ SOLR
500.0000 mg | Freq: Once | INTRAMUSCULAR | Status: AC
  Administered 2022-12-26: 500 mg via INTRAMUSCULAR

## 2022-12-26 NOTE — Progress Notes (Signed)
Pt presents for Rocephin injection.  + Gonorrhea on 8/22. Denies symptoms at this time. Reports that she received Rx, completed tx for yeast, chlamydia.

## 2023-01-08 ENCOUNTER — Ambulatory Visit (INDEPENDENT_AMBULATORY_CARE_PROVIDER_SITE_OTHER): Payer: Medicaid Other | Admitting: Obstetrics and Gynecology

## 2023-01-08 ENCOUNTER — Encounter: Payer: Self-pay | Admitting: Obstetrics and Gynecology

## 2023-01-08 VITALS — BP 115/72 | HR 84 | Wt 168.0 lb

## 2023-01-08 DIAGNOSIS — Z3480 Encounter for supervision of other normal pregnancy, unspecified trimester: Secondary | ICD-10-CM

## 2023-01-08 DIAGNOSIS — Z3009 Encounter for other general counseling and advice on contraception: Secondary | ICD-10-CM | POA: Insufficient documentation

## 2023-01-08 DIAGNOSIS — O98219 Gonorrhea complicating pregnancy, unspecified trimester: Secondary | ICD-10-CM | POA: Insufficient documentation

## 2023-01-08 DIAGNOSIS — O2302 Infections of kidney in pregnancy, second trimester: Secondary | ICD-10-CM

## 2023-01-08 DIAGNOSIS — O98813 Other maternal infectious and parasitic diseases complicating pregnancy, third trimester: Secondary | ICD-10-CM

## 2023-01-08 DIAGNOSIS — Z9889 Other specified postprocedural states: Secondary | ICD-10-CM | POA: Insufficient documentation

## 2023-01-08 DIAGNOSIS — A749 Chlamydial infection, unspecified: Secondary | ICD-10-CM

## 2023-01-08 DIAGNOSIS — O98213 Gonorrhea complicating pregnancy, third trimester: Secondary | ICD-10-CM

## 2023-01-08 NOTE — Progress Notes (Signed)
Subjective:  Jennifer [name] is a 32 y.o. [phone] at [redacted]w[redacted]d being seen today for ongoing prenatal care.  She is currently monitored for the following issues for this high-risk pregnancy and has Supervision of other normal pregnancy, antepartum; Chlamydia infection affecting pregnancy; Pyelonephritis affecting pregnancy in second trimester; Gonorrhea affecting pregnancy; History of loop electrical excision procedure (LEEP); and Unwanted fertility on their problem list.  Patient reports no complaints.  Contractions: Not present. Vag. Bleeding: None.  Movement: Present. Denies leaking of fluid.   The following portions of the patient's history were reviewed and updated as appropriate: allergies, current medications, past family history, past medical history, past social history, past surgical history and problem list. Problem list updated.  Objective:   Vitals:   01/08/23 0933  BP: 115/72  Pulse: 84  Weight: 168 lb (76.2 kg)    Fetal Status: Fetal Heart Rate (bpm): 140   Movement: Present     General:  Alert, oriented and cooperative. Patient is in no acute distress.  Skin: Skin is warm and dry. No rash noted.   Cardiovascular: Normal heart rate noted  Respiratory: Normal respiratory effort, no problems with respiration noted  Abdomen: Soft, gravid, appropriate for gestational age. Pain/Pressure: Absent     Pelvic:  Cervical exam deferred        Extremities: Normal range of motion.     Mental Status: Normal mood and affect. Normal behavior. Normal judgment and thought content.   Urinalysis:      Assessment and Plan:  Pregnancy: G4P3003 at [redacted]w[redacted]d  1. Supervision of other normal pregnancy, antepartum Stable  2. Chlamydia infection affecting pregnancy in third trimester S/P Tx TOC next visit  3. Pyelonephritis affecting pregnancy in second trimester Stable Continue with suppression  4. Gonorrhea affecting pregnancy in third trimester S/P Tx TOC with next visit  5. History of  loop electrical excision procedure (LEEP) Nl CL on anatomy scan No S/Sx of PTL  6. Unwanted fertility BTL papers signed  Preterm labor symptoms and general obstetric precautions including but not limited to vaginal bleeding, contractions, leaking of fluid and fetal movement were reviewed in detail with the patient. Please refer to After Visit Summary for other counseling recommendations.  Return in about 2 weeks (around 01/22/2023) for OB visit, face to face, MD only.   Hermina Staggers, MD

## 2023-01-22 ENCOUNTER — Other Ambulatory Visit (HOSPITAL_COMMUNITY)
Admission: RE | Admit: 2023-01-22 | Discharge: 2023-01-22 | Disposition: A | Discharge status: Home / Self Care | Payer: Medicaid Other | Source: Ambulatory Visit | Attending: Obstetrics & Gynecology | Admitting: Obstetrics & Gynecology

## 2023-01-22 ENCOUNTER — Ambulatory Visit (INDEPENDENT_AMBULATORY_CARE_PROVIDER_SITE_OTHER): Payer: Medicaid Other | Admitting: Obstetrics & Gynecology

## 2023-01-22 ENCOUNTER — Encounter: Payer: Self-pay | Admitting: Obstetrics & Gynecology

## 2023-01-22 VITALS — BP 110/71 | HR 85 | Wt 166.0 lb

## 2023-01-22 DIAGNOSIS — Z3480 Encounter for supervision of other normal pregnancy, unspecified trimester: Secondary | ICD-10-CM | POA: Insufficient documentation

## 2023-01-22 DIAGNOSIS — Z1339 Encounter for screening examination for other mental health and behavioral disorders: Secondary | ICD-10-CM | POA: Diagnosis not present

## 2023-01-22 DIAGNOSIS — Z3A35 35 weeks gestation of pregnancy: Secondary | ICD-10-CM | POA: Insufficient documentation

## 2023-01-22 DIAGNOSIS — R3915 Urgency of urination: Secondary | ICD-10-CM

## 2023-01-22 DIAGNOSIS — O23593 Infection of other part of genital tract in pregnancy, third trimester: Secondary | ICD-10-CM | POA: Insufficient documentation

## 2023-01-22 DIAGNOSIS — O26899 Other specified pregnancy related conditions, unspecified trimester: Secondary | ICD-10-CM

## 2023-01-22 LAB — POCT URINALYSIS DIPSTICK
Bilirubin, UA: NEGATIVE
Glucose, UA: NEGATIVE
Ketones, UA: NEGATIVE
Nitrite, UA: NEGATIVE
Protein, UA: NEGATIVE
Spec Grav, UA: 1.025 (ref 1.010–1.025)
Urobilinogen, UA: 0.2 E.U./dL
pH, UA: 7 (ref 5.0–8.0)

## 2023-01-22 MED ORDER — CEFADROXIL 500 MG PO CAPS
500.0000 mg | ORAL_CAPSULE | Freq: Two times a day (BID) | ORAL | 0 refills | Status: DC
Start: 2023-01-22 — End: 2023-02-12

## 2023-01-22 NOTE — Patient Instructions (Signed)
Return to office for any scheduled appointments. Call the office or go to the MAU at [facility] if: You begin to have strong, frequent contractions Your water breaks.  Sometimes it is a big gush of fluid, sometimes it is just a trickle that keeps getting your underwear wet or running down your legs You have vaginal bleeding.  It is normal to have a small amount of spotting if your cervix was checked.  You do not feel your baby moving like normal.  If you do not, get something to eat and drink and lay down and focus on feeling your baby move.   If your baby is still not moving like normal, you should call the office or go to MAU. Any other obstetric concerns.

## 2023-01-22 NOTE — Progress Notes (Signed)
ROB/GBS.  C/o vaginal swelling, yellowish discharge, urgency to urinate, but she is emptying her bladder.

## 2023-01-22 NOTE — Progress Notes (Signed)
PRENATAL VISIT NOTE  Subjective:  Jennifer [name] is a 32 y.o. [phone] at [redacted]w[redacted]d being seen today for ongoing prenatal care.  She is currently monitored for the following issues for this low-risk pregnancy and has Supervision of other normal pregnancy, antepartum; Chlamydia infection affecting pregnancy; Pyelonephritis affecting pregnancy in second trimester; Gonorrhea affecting pregnancy; History of loop electrical excision procedure (LEEP); and Unwanted fertility on their problem list.  Patient reports vaginal irritation, history of recent yeast infection.  Wants recheck.  Also reports increased urinary urgency, history of pyelonephritis this pregnancy.  Contractions: Not present. Vag. Bleeding: None.  Movement: Present. Denies leaking of fluid.   The following portions of the patient's history were reviewed and updated as appropriate: allergies, current medications, past family history, past medical history, past social history, past surgical history and problem list.   Objective:   Vitals:   01/22/23 0935  BP: 110/71  Pulse: 85  Weight: 166 lb (75.3 kg)    Fetal Status: Fetal Heart Rate (bpm): 147 Fundal Height: 35 cm Movement: Present  Presentation: Vertex  General:  Alert, oriented and cooperative. Patient is in no acute distress.  Skin: Skin is warm and dry. No rash noted.   Cardiovascular: Normal heart rate noted  Respiratory: Normal respiratory effort, no problems with respiration noted  Abdomen: Soft, gravid, appropriate for gestational age.  Pain/Pressure: Absent     Pelvic: Cervical exam performed in the presence of a chaperone Dilation: 1 Effacement (%): Thick Station: Ballotable  Extremities: Normal range of motion.  Edema: None  Mental Status: Normal mood and affect. Normal behavior. Normal judgment and thought content.   Results for orders placed or performed in visit on 11/29/22 (from the past 24 hour(s))  POCT Urinalysis Dipstick     Status: Abnormal   Collection  Time: 01/22/23  9:56 AM  Result Value Ref Range   Color, UA     Clarity, UA     Glucose, UA Negative Negative   Bilirubin, UA NEGATIVE    Ketones, UA NEGATIVE    Spec Grav, UA 1.025 1.010 - 1.025   Blood, UA +MODERATE    pH, UA 7.0 5.0 - 8.0   Protein, UA Negative Negative   Urobilinogen, UA 0.2 0.2 or 1.0 E.U./dL   Nitrite, UA NEGATIVE    Leukocytes, UA Trace (A) Negative   Appearance     Odor      Assessment and Plan:  Pregnancy: G4P3003 at [redacted]w[redacted]d 1. Vaginitis affecting pregnancy in third trimester, antepartum - Cervicovaginal ancillary only( [place]) done, will follow up results and manage accordingly.  2. Urinary urgency during pregnancy - POCT Urinalysis Dipstick concerning. - Culture, OB Urine sent -  Will presumptively treat given history. Cefadroxil (DURICEF) 500 MG capsule; Take 1 capsule (500 mg total) by mouth 2 (two) times daily.  Dispense: 14 capsule; Refill: 0  3. [redacted] weeks gestation of pregnancy 4. Supervision of other normal pregnancy, antepartum Cultures done, will follow up results and manage accordingly. - Cervicovaginal ancillary only( [place]) - Culture, beta strep (group b only)  Preterm labor symptoms and general obstetric precautions including but not limited to vaginal bleeding, contractions, leaking of fluid and fetal movement were reviewed in detail with the patient. Please refer to After Visit Summary for other counseling recommendations.   Return in about 1 week (around 01/29/2023) for OFFICE OB VISIT (MD or APP).  Future Appointments  Date Time Provider Department Center  01/29/2023  9:35 AM Hermina Staggers, MD [facility]  None  02/05/2023  9:35 AM Adam Phenix, MD [facility] None  02/12/2023  9:35 AM Hermina Staggers, MD [facility] None  02/19/2023  9:35 AM Constant, Gigi Gin, MD [facility] None  02/26/2023  9:35 AM Constant, Gigi Gin, MD [facility] None    Jaynie Collins, MD

## 2023-01-23 LAB — CERVICOVAGINAL ANCILLARY ONLY
Bacterial Vaginitis (gardnerella): NEGATIVE
Candida Glabrata: NEGATIVE
Candida Vaginitis: NEGATIVE
Chlamydia: NEGATIVE
Comment: NEGATIVE
Comment: NEGATIVE
Comment: NEGATIVE
Comment: NEGATIVE
Comment: NEGATIVE
Comment: NORMAL
Neisseria Gonorrhea: NEGATIVE
Trichomonas: NEGATIVE

## 2023-01-24 LAB — CULTURE, OB URINE

## 2023-01-24 LAB — URINE CULTURE, OB REFLEX

## 2023-01-25 ENCOUNTER — Encounter: Payer: Self-pay | Admitting: Obstetrics & Gynecology

## 2023-01-25 ENCOUNTER — Other Ambulatory Visit: Payer: Self-pay

## 2023-01-25 DIAGNOSIS — R300 Dysuria: Secondary | ICD-10-CM

## 2023-01-25 DIAGNOSIS — O99820 Streptococcus B carrier state complicating pregnancy: Secondary | ICD-10-CM | POA: Insufficient documentation

## 2023-01-25 LAB — CULTURE, BETA STREP (GROUP B ONLY): Strep Gp B Culture: POSITIVE — AB

## 2023-01-25 MED ORDER — PHENAZOPYRIDINE HCL 100 MG PO TABS
100.0000 mg | ORAL_TABLET | Freq: Three times a day (TID) | ORAL | 0 refills | Status: DC | PRN
Start: 2023-01-25 — End: 2023-02-17

## 2023-01-29 ENCOUNTER — Encounter: Payer: Self-pay | Admitting: Obstetrics and Gynecology

## 2023-01-29 ENCOUNTER — Ambulatory Visit (INDEPENDENT_AMBULATORY_CARE_PROVIDER_SITE_OTHER): Payer: Medicaid Other | Admitting: Obstetrics and Gynecology

## 2023-01-29 VITALS — BP 107/72 | HR 80 | Wt 168.0 lb

## 2023-01-29 DIAGNOSIS — O2302 Infections of kidney in pregnancy, second trimester: Secondary | ICD-10-CM

## 2023-01-29 DIAGNOSIS — Z3480 Encounter for supervision of other normal pregnancy, unspecified trimester: Secondary | ICD-10-CM

## 2023-01-29 DIAGNOSIS — Z3009 Encounter for other general counseling and advice on contraception: Secondary | ICD-10-CM

## 2023-01-29 DIAGNOSIS — Z3A36 36 weeks gestation of pregnancy: Secondary | ICD-10-CM

## 2023-01-29 DIAGNOSIS — O99820 Streptococcus B carrier state complicating pregnancy: Secondary | ICD-10-CM

## 2023-01-29 DIAGNOSIS — O26843 Uterine size-date discrepancy, third trimester: Secondary | ICD-10-CM

## 2023-01-29 NOTE — Progress Notes (Signed)
Pt states she has more ctx while at work.

## 2023-01-29 NOTE — Progress Notes (Signed)
Subjective:  Jennifer [name] is a 32 y.o. [phone] at [redacted]w[redacted]d being seen today for ongoing prenatal care.  She is currently monitored for the following issues for this high-risk pregnancy and has Supervision of other normal pregnancy, antepartum; Pyelonephritis affecting pregnancy in second trimester; History of loop electrical excision procedure (LEEP); Unwanted fertility; and Group B Streptococcus carrier, +RV culture, currently pregnant on their problem list.  Patient reports  general discomforts of pregnancy .  Contractions: Irregular. Vag. Bleeding: None.  Movement: Present. Denies leaking of fluid.   The following portions of the patient's history were reviewed and updated as appropriate: allergies, current medications, past family history, past medical history, past social history, past surgical history and problem list. Problem list updated.  Objective:   Vitals:   01/29/23 0941  BP: 107/72  Pulse: 80  Weight: 168 lb (76.2 kg)    Fetal Status: Fetal Heart Rate (bpm): 150   Movement: Present     General:  Alert, oriented and cooperative. Patient is in no acute distress.  Skin: Skin is warm and dry. No rash noted.   Cardiovascular: Normal heart rate noted  Respiratory: Normal respiratory effort, no problems with respiration noted  Abdomen: Soft, gravid, appropriate for gestational age. Pain/Pressure: Present     Pelvic:  Cervical exam deferred        Extremities: Normal range of motion.     Mental Status: Normal mood and affect. Normal behavior. Normal judgment and thought content.   Urinalysis:      Assessment and Plan:  Pregnancy: G4P3003 at [redacted]w[redacted]d  1. Supervision of other normal pregnancy, antepartum Labor precautions Growth scan  2. Group B Streptococcus carrier, +RV culture, currently pregnant Tx while in labor  3. Pyelonephritis affecting pregnancy in second trimester Continue with suppression  4. Unwanted fertility BTL paper signed  Term labor symptoms and  general obstetric precautions including but not limited to vaginal bleeding, contractions, leaking of fluid and fetal movement were reviewed in detail with the patient. Please refer to After Visit Summary for other counseling recommendations.  Return in about 1 week (around 02/05/2023) for OB visit, face to face, any provider.   Hermina Staggers, MD

## 2023-02-01 ENCOUNTER — Ambulatory Visit: Payer: Medicaid Other | Attending: Obstetrics and Gynecology

## 2023-02-01 DIAGNOSIS — O26843 Uterine size-date discrepancy, third trimester: Secondary | ICD-10-CM | POA: Diagnosis present

## 2023-02-01 DIAGNOSIS — Z9889 Other specified postprocedural states: Secondary | ICD-10-CM

## 2023-02-01 DIAGNOSIS — O3443 Maternal care for other abnormalities of cervix, third trimester: Secondary | ICD-10-CM

## 2023-02-01 DIAGNOSIS — Z3A37 37 weeks gestation of pregnancy: Secondary | ICD-10-CM

## 2023-02-05 ENCOUNTER — Ambulatory Visit (INDEPENDENT_AMBULATORY_CARE_PROVIDER_SITE_OTHER): Payer: Medicaid Other | Admitting: Obstetrics & Gynecology

## 2023-02-05 VITALS — BP 116/72 | HR 94 | Wt 165.0 lb

## 2023-02-05 DIAGNOSIS — Z3480 Encounter for supervision of other normal pregnancy, unspecified trimester: Secondary | ICD-10-CM

## 2023-02-05 DIAGNOSIS — O99820 Streptococcus B carrier state complicating pregnancy: Secondary | ICD-10-CM

## 2023-02-05 NOTE — Progress Notes (Signed)
Pt would like self swab today for vaginal discharge.

## 2023-02-05 NOTE — Progress Notes (Signed)
   PRENATAL VISIT NOTE  Subjective:  Jennifer [name] is a 32 y.o. [phone] at [redacted]w[redacted]d being seen today for ongoing prenatal care.  She is currently monitored for the following issues for this high-risk pregnancy and has Supervision of other normal pregnancy, antepartum; Pyelonephritis affecting pregnancy in second trimester; History of loop electrical excision procedure (LEEP); Unwanted fertility; and Group B Streptococcus carrier, +RV culture, currently pregnant on their problem list.  Patient reports occasional contractions.  Contractions: Irregular. Vag. Bleeding: None.  Movement: Present. Denies leaking of fluid.   The following portions of the patient's history were reviewed and updated as appropriate: allergies, current medications, past family history, past medical history, past social history, past surgical history and problem list.   Objective:   Vitals:   02/05/23 0935  BP: 116/72  Pulse: 94  Weight: 165 lb (74.8 kg)    Fetal Status:     Movement: Present  Presentation: Vertex  General:  Alert, oriented and cooperative. Patient is in no acute distress.  Skin: Skin is warm and dry. No rash noted.   Cardiovascular: Normal heart rate noted  Respiratory: Normal respiratory effort, no problems with respiration noted  Abdomen: Soft, gravid, appropriate for gestational age.  Pain/Pressure: Present     Pelvic: Cervical exam performed in the presence of a chaperone Dilation: 1 Effacement (%): 20 Station: -3  Extremities: Normal range of motion.     Mental Status: Normal mood and affect. Normal behavior. Normal judgment and thought content.   Assessment and Plan:  Pregnancy: G4P3003 at [redacted]w[redacted]d 1. Supervision of other normal pregnancy, antepartum [place] 83% ile, h/o shoulder dystocia last delivery Consider IOL after 39 weeks per MFM recommmendation 2. Group B Streptococcus carrier, +RV culture, currently pregnant Negative screen for vaginal infection 2 weeks ago  Term labor symptoms and  general obstetric precautions including but not limited to vaginal bleeding, contractions, leaking of fluid and fetal movement were reviewed in detail with the patient. Please refer to After Visit Summary for other counseling recommendations.   Return in about 1 week (around 02/12/2023).  Future Appointments  Date Time Provider Department Center  02/12/2023  9:35 AM Hermina Staggers, MD [facility] None  02/15/2023  7:15 AM [facility] SCHED ROOM [facility] None  02/19/2023  9:35 AM Constant, Gigi Gin, MD [facility] None  02/26/2023  9:35 AM Constant, Gigi Gin, MD [facility] None    Scheryl Darter, MD

## 2023-02-07 ENCOUNTER — Other Ambulatory Visit: Payer: Self-pay | Admitting: Advanced Practice Midwife

## 2023-02-08 ENCOUNTER — Encounter: Payer: Self-pay | Admitting: Obstetrics & Gynecology

## 2023-02-08 ENCOUNTER — Other Ambulatory Visit (HOSPITAL_COMMUNITY)
Admission: RE | Admit: 2023-02-08 | Discharge: 2023-02-08 | Disposition: A | Discharge status: Home / Self Care | Payer: Medicaid Other | Source: Ambulatory Visit | Attending: Obstetrics and Gynecology | Admitting: Obstetrics and Gynecology

## 2023-02-08 ENCOUNTER — Ambulatory Visit: Payer: Medicaid Other

## 2023-02-08 VITALS — BP 119/72 | HR 86 | Wt 167.6 lb

## 2023-02-08 DIAGNOSIS — N898 Other specified noninflammatory disorders of vagina: Secondary | ICD-10-CM

## 2023-02-08 NOTE — Progress Notes (Signed)
..  SUBJECTIVE:  32 y.o. female complains of white vaginal discharge for 2 week(s). Denies abnormal vaginal bleeding or significant pelvic pain or fever. No UTI symptoms. Denies history of known exposure to STD.  Patient's last menstrual period was 05/16/2022.  OBJECTIVE:  She appears well, afebrile. Urine dipstick: not done.  ASSESSMENT:  Vaginal Discharge    PLAN:  GC, chlamydia, trichomonas, BVAG, CVAG probe sent to lab. Treatment: To be determined once lab results are received ROV prn if symptoms persist or worsen.

## 2023-02-09 LAB — CERVICOVAGINAL ANCILLARY ONLY
Bacterial Vaginitis (gardnerella): NEGATIVE
Chlamydia: NEGATIVE
Comment: NEGATIVE
Comment: NEGATIVE
Comment: NEGATIVE
Comment: NORMAL
Neisseria Gonorrhea: NEGATIVE
Trichomonas: NEGATIVE

## 2023-02-12 ENCOUNTER — Telehealth (HOSPITAL_COMMUNITY): Payer: Self-pay | Admitting: *Deleted

## 2023-02-12 ENCOUNTER — Encounter (HOSPITAL_COMMUNITY): Payer: Self-pay

## 2023-02-12 ENCOUNTER — Ambulatory Visit (INDEPENDENT_AMBULATORY_CARE_PROVIDER_SITE_OTHER): Payer: Medicaid Other | Admitting: Obstetrics and Gynecology

## 2023-02-12 ENCOUNTER — Encounter: Payer: Self-pay | Admitting: Obstetrics and Gynecology

## 2023-02-12 VITALS — BP 123/79 | HR 79 | Wt 168.0 lb

## 2023-02-12 DIAGNOSIS — O2302 Infections of kidney in pregnancy, second trimester: Secondary | ICD-10-CM

## 2023-02-12 DIAGNOSIS — Z3480 Encounter for supervision of other normal pregnancy, unspecified trimester: Secondary | ICD-10-CM

## 2023-02-12 DIAGNOSIS — Z3009 Encounter for other general counseling and advice on contraception: Secondary | ICD-10-CM

## 2023-02-12 DIAGNOSIS — O99820 Streptococcus B carrier state complicating pregnancy: Secondary | ICD-10-CM

## 2023-02-12 NOTE — Progress Notes (Signed)
   IOL scheduled for 02/15/23

## 2023-02-12 NOTE — Progress Notes (Signed)
Subjective:  Jennifer [name] is a 32 y.o. [phone] at [redacted]w[redacted]d being seen today for ongoing prenatal care.  She is currently monitored for the following issues for this low-risk pregnancy and has Supervision of other normal pregnancy, antepartum; Pyelonephritis affecting pregnancy in second trimester; History of loop electrical excision procedure (LEEP); Unwanted fertility; and Group B Streptococcus carrier, +RV culture, currently pregnant on their problem list.  Patient reports  general discomforts of pregnancy .  Contractions: Irritability. Vag. Bleeding: None.  Movement: Present. Denies leaking of fluid.   The following portions of the patient's history were reviewed and updated as appropriate: allergies, current medications, past family history, past medical history, past social history, past surgical history and problem list. Problem list updated.  Objective:   Vitals:   02/12/23 0956  BP: 123/79  Pulse: 79  Weight: 168 lb (76.2 kg)    Fetal Status: Fetal Heart Rate (bpm): 157   Movement: Present     General:  Alert, oriented and cooperative. Patient is in no acute distress.  Skin: Skin is warm and dry. No rash noted.   Cardiovascular: Normal heart rate noted  Respiratory: Normal respiratory effort, no problems with respiration noted  Abdomen: Soft, gravid, appropriate for gestational age. Pain/Pressure: Absent     Pelvic:  Cervical exam performed        Extremities: Normal range of motion.  Edema: None  Mental Status: Normal mood and affect. Normal behavior. Normal judgment and thought content.   Urinalysis:      Assessment and Plan:  Pregnancy: G4P3003 at [redacted]w[redacted]d  1. Supervision of other normal pregnancy, antepartum Labor precautions IOL 02/15/23  2. Group B Streptococcus carrier, +RV culture, currently pregnant Tx while in labor  3. Unwanted fertility BTL papers signed  4. Pyelonephritis affecting pregnancy in second trimester Continue with suppression  Term labor  symptoms and general obstetric precautions including but not limited to vaginal bleeding, contractions, leaking of fluid and fetal movement were reviewed in detail with the patient. Please refer to After Visit Summary for other counseling recommendations.  No follow-ups on file.   Hermina Staggers, MD

## 2023-02-12 NOTE — Telephone Encounter (Signed)
Preadmission screen  

## 2023-02-13 ENCOUNTER — Telehealth (HOSPITAL_COMMUNITY): Payer: Self-pay | Admitting: *Deleted

## 2023-02-13 ENCOUNTER — Encounter (HOSPITAL_COMMUNITY): Payer: Self-pay | Admitting: *Deleted

## 2023-02-13 NOTE — Telephone Encounter (Signed)
Preadmission screen  

## 2023-02-15 ENCOUNTER — Inpatient Hospital Stay (HOSPITAL_COMMUNITY): Payer: Medicaid Other

## 2023-02-15 ENCOUNTER — Other Ambulatory Visit: Payer: Self-pay

## 2023-02-15 ENCOUNTER — Inpatient Hospital Stay (HOSPITAL_COMMUNITY)
Admission: RE | Admit: 2023-02-15 | Discharge: 2023-02-17 | DRG: 807 | Disposition: A | Discharge status: Home / Self Care | Payer: Medicaid Other | Attending: Obstetrics & Gynecology | Admitting: Obstetrics & Gynecology

## 2023-02-15 ENCOUNTER — Encounter (HOSPITAL_COMMUNITY): Payer: Self-pay | Admitting: Obstetrics & Gynecology

## 2023-02-15 DIAGNOSIS — O3663X0 Maternal care for excessive fetal growth, third trimester, not applicable or unspecified: Principal | ICD-10-CM | POA: Diagnosis present

## 2023-02-15 DIAGNOSIS — Z803 Family history of malignant neoplasm of breast: Secondary | ICD-10-CM

## 2023-02-15 DIAGNOSIS — O99824 Streptococcus B carrier state complicating childbirth: Secondary | ICD-10-CM | POA: Diagnosis present

## 2023-02-15 DIAGNOSIS — Z3480 Encounter for supervision of other normal pregnancy, unspecified trimester: Principal | ICD-10-CM

## 2023-02-15 DIAGNOSIS — Z87891 Personal history of nicotine dependence: Secondary | ICD-10-CM | POA: Diagnosis not present

## 2023-02-15 DIAGNOSIS — Z3A39 39 weeks gestation of pregnancy: Secondary | ICD-10-CM

## 2023-02-15 DIAGNOSIS — O99820 Streptococcus B carrier state complicating pregnancy: Secondary | ICD-10-CM | POA: Diagnosis not present

## 2023-02-15 DIAGNOSIS — Z8249 Family history of ischemic heart disease and other diseases of the circulatory system: Secondary | ICD-10-CM | POA: Diagnosis not present

## 2023-02-15 LAB — TYPE AND SCREEN
ABO/RH(D): O POS
Antibody Screen: NEGATIVE

## 2023-02-15 LAB — CBC
HCT: 30.5 % — ABNORMAL LOW (ref 36.0–46.0)
Hemoglobin: 9.7 g/dL — ABNORMAL LOW (ref 12.0–15.0)
MCH: 27.5 pg (ref 26.0–34.0)
MCHC: 31.8 g/dL (ref 30.0–36.0)
MCV: 86.4 fL (ref 80.0–100.0)
Platelets: 192 10*3/uL (ref 150–400)
RBC: 3.53 MIL/uL — ABNORMAL LOW (ref 3.87–5.11)
RDW: 13.2 % (ref 11.5–15.5)
WBC: 7.2 10*3/uL (ref 4.0–10.5)
nRBC: 0 % (ref 0.0–0.2)

## 2023-02-15 MED ORDER — TERBUTALINE SULFATE 1 MG/ML IJ SOLN: 0.2500 mg | Freq: Once | INTRAMUSCULAR | Status: DC | PRN

## 2023-02-15 MED ORDER — OXYTOCIN-SODIUM CHLORIDE 30-0.9 UT/500ML-% IV SOLN
1.0000 m[IU]/min | INTRAVENOUS | Status: DC
  Administered 2023-02-15: 2 m[IU]/min via INTRAVENOUS

## 2023-02-15 MED ORDER — SODIUM CHLORIDE 0.9 % IV SOLN
5.0000 10*6.[IU] | Freq: Once | INTRAVENOUS | Status: AC
  Administered 2023-02-15: 5 10*6.[IU] via INTRAVENOUS
  Filled 2023-02-15: qty 5

## 2023-02-15 MED ORDER — PENICILLIN G POT IN DEXTROSE 60000 UNIT/ML IV SOLN
3.0000 10*6.[IU] | INTRAVENOUS | Status: DC
  Administered 2023-02-15 – 2023-02-16 (×4): 3 10*6.[IU] via INTRAVENOUS
  Filled 2023-02-15 (×4): qty 50

## 2023-02-15 MED ORDER — SOD CITRATE-CITRIC ACID 500-334 MG/5ML PO SOLN: 30.0000 mL | ORAL | Status: DC | PRN

## 2023-02-15 MED ORDER — ACETAMINOPHEN 325 MG PO TABS: 650.0000 mg | ORAL_TABLET | ORAL | Status: DC | PRN

## 2023-02-15 MED ORDER — MISOPROSTOL 25 MCG QUARTER TABLET
25.0000 ug | ORAL_TABLET | Freq: Once | ORAL | Status: AC
  Administered 2023-02-15: 25 ug via VAGINAL
  Filled 2023-02-15: qty 1

## 2023-02-15 MED ORDER — OXYTOCIN BOLUS FROM INFUSION
333.0000 mL | Freq: Once | INTRAVENOUS | Status: AC
  Administered 2023-02-16: 333 mL via INTRAVENOUS

## 2023-02-15 MED ORDER — ONDANSETRON HCL 4 MG/2ML IJ SOLN
4.0000 mg | Freq: Four times a day (QID) | INTRAMUSCULAR | Status: DC | PRN
  Administered 2023-02-16: 4 mg via INTRAVENOUS
  Filled 2023-02-15: qty 2

## 2023-02-15 MED ORDER — LACTATED RINGERS IV SOLN: 500.0000 mL | INTRAVENOUS | Status: DC | PRN

## 2023-02-15 MED ORDER — LIDOCAINE HCL (PF) 1 % IJ SOLN: 30.0000 mL | INTRAMUSCULAR | Status: DC | PRN

## 2023-02-15 MED ORDER — FENTANYL CITRATE (PF) 100 MCG/2ML IJ SOLN
50.0000 ug | INTRAMUSCULAR | Status: DC | PRN
  Administered 2023-02-16: 100 ug via INTRAVENOUS
  Administered 2023-02-16 (×2): 50 ug via INTRAVENOUS
  Filled 2023-02-15 (×3): qty 2

## 2023-02-15 MED ORDER — LACTATED RINGERS IV SOLN: INTRAVENOUS | Status: DC

## 2023-02-15 MED ORDER — OXYTOCIN-SODIUM CHLORIDE 30-0.9 UT/500ML-% IV SOLN: 2.5000 [IU]/h | INTRAVENOUS | Status: DC

## 2023-02-15 MED ORDER — MISOPROSTOL 25 MCG QUARTER TABLET: 25.0000 ug | ORAL_TABLET | ORAL | Status: DC | PRN

## 2023-02-15 MED ORDER — OXYTOCIN-SODIUM CHLORIDE 30-0.9 UT/500ML-% IV SOLN
1.0000 m[IU]/min | INTRAVENOUS | Status: DC
  Filled 2023-02-15: qty 500

## 2023-02-15 NOTE — Progress Notes (Addendum)
Patient Vitals for the past 4 hrs:  BP Temp Temp src Pulse Resp  02/15/23 2059 119/73 98.4 F (36.9 C) Oral 93 16  02/15/23 1922 122/82 98.1 F (36.7 C) Oral 94 16   Ctx are mild per pt, q 1-4 minutes.  FHR Cat 1.  Cx 1.5/80/-2.  Discussed options at this time:  foley/AROM/pitocin.  Pt elects for pitocin.  Will start now, plan AROM when cx dilated some more.

## 2023-02-15 NOTE — Progress Notes (Signed)
Labor Progress Note Jennifer [name] is a 32 y.o. [phone] at [redacted]w[redacted]d presented for IOL indicated for suspected LGA.  S: Doing well, mild crampy pain but tolerating contractions.   O:  BP 127/72   Pulse 89   Temp 98.4 F (36.9 C) (Oral)   Resp 16   Ht 5' 5.5" (1.664 m)   Wt 76.2 kg   LMP 05/16/2022   BMI 27.53 kg/m  EFM: 145/mod variability/+accels/no decels  CVE: Dilation: 2 Effacement (%): 80 Station: -2 Presentation: Vertex Exam by:: Whittley Carandang, MD   A&P: 32 y.o. [id number] [redacted]w[redacted]d admitted for IOL indicated for suspected LGA #Labor: Progressing well. SVE unchanged from prior. Will increase pitocin per protocol. Will discuss AROM after dilation progress.  #Pain: Per patient request.  #FWB: Cat I tracing #GBS positive continue Penicillin   This patient's plan of care has been discussed with Cresenzo, CNM. Please see attestation.    Charma Igo, MD 11:32 PM

## 2023-02-15 NOTE — H&P (Signed)
OBSTETRIC ADMISSION HISTORY AND PHYSICAL  Jennifer [name] is a 32 y.o. female [phone] with IUP at [redacted]w[redacted]d by LMP presenting for IOL due to suspecting LGA. She reports +FMs, No LOF, no VB, no blurry vision, headaches or peripheral edema, and RUQ pain.  She plans on breastfeeding. She requests bilateral tubal ligation for birth control. 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) repeat pap negative (6/22) s/p LEEP  Nursing Staff Provider  Office Location [facility] Dating  02/20/2023, by Last Menstrual Period  [facility] Model Arly.Keller ] Traditional [ ]  Centering [ ]  Mom-Baby Dyad      Language  English Anatomy US     Flu Vaccine  Declined 3/14 Genetic/Carrier Screen  NIPS:    AFP:   negative Horizon:  TDaP Vaccine   Declined 01/22/2023 Hgb A1C or  GTT Early  Third trimester : normal  COVID Vaccine Not vaccinated   LAB RESULTS   Rhogam  --/--/O POS (06/07 2241)  Blood Type --/--/O POS (06/07 2241)   Baby Feeding Plan Breast Antibody NEG (06/07 2241)  Contraception BTL Rubella 4.93 (03/14 0916)  Circumcision Yes if a boy RPR Non Reactive (08/22 0846)   Pediatrician  [facility] HBsAg Negative (03/14 0916)   Support Person FOB HCVAb Non Reactive (03/14 0916)   Prenatal Classes   HIV Non Reactive (08/22 0846)     BTL Consent  12/21/2022 GBS  POSITIVE  VBAC Consent   Pap       Diagnosis  Date Value Ref Range Status  10/19/2020     Final    - Negative for intraepithelial lesion or malignancy (NILM)             DME Rx Arly.Keller ] BP cuff Arly.Keller ] Weight Scale Waterbirth  [ ]  Class [ ]  Consent [ ]  CNM visit  PHQ9 & GAD7 [ X ] new OB [  ] 28  weeks  [X]  36 weeks Induction  [ ]  Orders Entered [ ] Foley Y/N    Past Medical History: Past Medical History:  Diagnosis Date   BV (bacterial vaginosis)    Chlamydia    Nausea and vomiting in pregnancy prior to [redacted] weeks gestation 12/04/2012   Vaginal Pap smear, abnormal     Past Surgical History: Past Surgical History:  Procedure Laterality Date   CERVICAL BIOPSY  W/ LOOP ELECTRODE EXCISION     COLPOSCOPY  09/03/2019       LEEP  10/22/2019        Obstetrical History: OB History     Gravida  4   Para  3   Term  3   Preterm      AB      Living  3      SAB      IAB      Ectopic      Multiple      Live Births  3           Social History Social History   Socioeconomic History   Marital status: Single    Spouse name: Not on file   Number of children:  Not on file   Years of education: Not on file   Highest education level: Some college, no degree  Occupational History   Not on file  Tobacco Use   Smoking status: Former    Current packs/day: 0.00    Types: Cigarettes    Quit date: 07/16/2010    Years since quitting: 12.5   Smokeless tobacco: Never  Vaping Use   Vaping status: Never Used  Substance and Sexual Activity   Alcohol use: Not Currently    Comment: Occassional, prior to pregnancy   Drug use: No   Sexual activity: Yes    Partners: Male    Birth control/protection: None  Other Topics Concern   Not on file  Social History Narrative   Not on file   Social Determinants of Health   Financial Resource Strain: Not on file  Food Insecurity: No Food Insecurity (02/15/2023)   Hunger Vital Sign    Worried About Running Out of Food in the Last Year: Never true    Ran Out of Food in the Last Year: Never true  Transportation Needs: No Transportation Needs (02/15/2023)   PRAPARE - Administrator, Civil Service (Medical): No    Lack of Transportation (Non-Medical): No  Physical Activity: Not on file  Stress: Not on file   Social Connections: Not on file    Family History: Family History  Problem Relation Age of Onset   Hypertension Mother    Breast cancer Maternal Aunt     Allergies: No Known Allergies  Medications Prior to Admission  Medication Sig Dispense Refill Last Dose   Prenatal Vit-Fe Fumarate-FA (MULTIVITAMIN-PRENATAL) 27-0.8 MG TABS tablet Take 1 tablet by mouth daily at 12 noon.   Past Week   Blood Pressure Monitoring (BLOOD PRESSURE KIT) DEVI 1 kit by Does not apply route once a week. 1 each 0    cephALEXin (KEFLEX) 250 MG capsule Take 1 capsule (250 mg total) by mouth at bedtime. 90 capsule 1 02/13/2023   phenazopyridine (PYRIDIUM) 100 MG tablet Take 1 tablet (100 mg total) by mouth 3 (three) times daily as needed for pain. 9 tablet 0    terconazole (TERAZOL 7) 0.4 % vaginal cream Place 1 applicator vaginally at bedtime. 45 g 0      Review of Systems   All systems reviewed and negative except as stated in HPI  Blood pressure 125/89, pulse (!) 103, temperature 98.6 F (37 C), temperature source Axillary, resp. rate 16, height 5' 5.5" (1.664 m), weight 76.2 kg, last menstrual period 05/16/2022. General appearance: alert, cooperative, appears stated age, and no distress Lungs: clear to auscultation bilaterally Heart: regular rate and rhythm Abdomen: soft, non-tender; bowel sounds normal Pelvic: 2 cm dilated and 50% effacement Extremities: Homans sign is negative, no sign of DVT Presentation: cephalic Fetal monitoring: Baseline: 160 bpm, Variability: Good {> 6 bpm), Accelerations: Reactive, and Decelerations: Absent Uterine activityFrequency: Every 2-3 minutes Dilation: 2 Effacement (%): 50 Station: Ballotable Exam by:: Dr. Judd Lien   Prenatal labs: ABO, Rh: --/--/O POS (10/17 1505) Antibody: NEG (10/17 1505) Rubella: 4.93 (03/14 0916) RPR: Non Reactive (08/22 0846)  HBsAg: Negative (03/14 0916)  HIV: Non Reactive (08/22 0846)  GBS: Positive/-- (09/23 1342)  2 hr Glucola:  89, passed Genetic screening:  Low risk Anatomy US: Appears normal  Prenatal Transfer Tool  Maternal Diabetes: No Genetic Screening: Normal Maternal Ultrasounds/Referrals: Normal Fetal Ultrasounds or other Referrals:  Referred to Materal Fetal Medicine  and Social Work Maternal Substance Abuse:  No Significant Maternal Medications:  Meds include: Other:  Chronic UTI suppression therapy with Keflex 250 mg QD Significant Maternal Lab Results:  Group B Strep positive Number of Prenatal Visits:greater than 3 verified prenatal visits Other Comments:  None  Results for orders placed or performed during the hospital encounter of 02/15/23 (from the past 24 hour(s))  Type and screen [facility]   Collection Time: 02/15/23  3:05 PM  Result Value Ref Range   ABO/RH(D) O POS    Antibody Screen NEG    Sample Expiration      02/18/2023,2359 Performed at [facility], [address], [place], [place] [address]   CBC   Collection Time: 02/15/23  3:06 PM  Result Value Ref Range   WBC 7.2 4.0 - 10.5 K/uL   RBC 3.53 (L) 3.87 - 5.11 MIL/uL   Hemoglobin 9.7 (L) 12.0 - 15.0 g/dL   HCT 24.4 (L) 01.0 - 27.2 %   MCV 86.4 80.0 - 100.0 fL   MCH 27.5 26.0 - 34.0 pg   MCHC 31.8 30.0 - 36.0 g/dL   RDW 53.6 64.4 - 03.4 %   Platelets 192 150 - 400 K/uL   nRBC 0.0 0.0 - 0.2 %    Patient Active Problem List   Diagnosis Date Noted   Indication for care in labor or delivery 02/15/2023   Group B Streptococcus carrier, +RV culture, currently pregnant 01/25/2023   History of loop electrical excision procedure (LEEP) 01/08/2023   Unwanted fertility 01/08/2023   Pyelonephritis affecting pregnancy in second trimester 10/06/2022   Supervision of other normal pregnancy, antepartum 07/13/2022    Assessment/Plan:  Jennifer [name] is a 32 y.o. G4P3003 at [redacted]w[redacted]d here for IOL due to suspecting LGA.   #Labor: Some contractions on tocvo. Here fir IOL. Will trial Cytotec vaginally first and  will monitor uterine contractions and assess need for PO Cytotec prior to Pitocin. May do AROM if additional augmentation is required. Shoulder precautions.  #Pain: She is currently in no pain and desires a natural birth without epidural. IV Fentanyl PRN #FWB: 3468g, 83%ile #ID:  +GBS #MOF: Breastfeeding  #MOC: Bilateral tubal ligation #Circ:  N/A  #Positive GBS - PCN G prior to delivery  #History of Pyelonephritis in 2nd trimester - Asymptomatic - Will get PCN G   Earlie Server, Student-PA  02/15/2023, 4:20 PM  Evaluation and management procedures were performed by the PA student under my supervision. I was immediately available for direct supervision, assistance and direction throughout this encounter.  I also confirm that I have verified the information documented in the student's note, and that I have also personally reperformed the pertinent components of the physical exam and all of the medical decision making activities.  I have also made any necessary editorial changes.   Mittie Bodo, MD Family Medicine - Obstetrics Fellow

## 2023-02-16 ENCOUNTER — Inpatient Hospital Stay (HOSPITAL_COMMUNITY): Payer: Medicaid Other | Admitting: Anesthesiology

## 2023-02-16 ENCOUNTER — Encounter (HOSPITAL_COMMUNITY): Payer: Self-pay | Admitting: Obstetrics & Gynecology

## 2023-02-16 DIAGNOSIS — O99820 Streptococcus B carrier state complicating pregnancy: Secondary | ICD-10-CM

## 2023-02-16 DIAGNOSIS — Z3A39 39 weeks gestation of pregnancy: Secondary | ICD-10-CM

## 2023-02-16 DIAGNOSIS — O3663X0 Maternal care for excessive fetal growth, third trimester, not applicable or unspecified: Secondary | ICD-10-CM

## 2023-02-16 LAB — RPR: RPR Ser Ql: NONREACTIVE

## 2023-02-16 MED ORDER — SODIUM CHLORIDE 0.9% FLUSH: 10.0000 mL | Freq: Two times a day (BID) | INTRAVENOUS | Status: DC

## 2023-02-16 MED ORDER — EPHEDRINE 5 MG/ML INJ: 10.0000 mg | INTRAVENOUS | Status: DC | PRN

## 2023-02-16 MED ORDER — LIDOCAINE HCL (PF) 1 % IJ SOLN
INTRAMUSCULAR | Status: DC | PRN
  Administered 2023-02-16: 10 mL via EPIDURAL

## 2023-02-16 MED ORDER — DIBUCAINE (PERIANAL) 1 % EX OINT: 1.0000 | TOPICAL_OINTMENT | CUTANEOUS | Status: DC | PRN

## 2023-02-16 MED ORDER — ONDANSETRON HCL 4 MG/2ML IJ SOLN: 4.0000 mg | INTRAMUSCULAR | Status: DC | PRN

## 2023-02-16 MED ORDER — SIMETHICONE 80 MG PO CHEW: 80.0000 mg | CHEWABLE_TABLET | ORAL | Status: DC | PRN

## 2023-02-16 MED ORDER — ONDANSETRON HCL 4 MG PO TABS: 4.0000 mg | ORAL_TABLET | ORAL | Status: DC | PRN

## 2023-02-16 MED ORDER — FENTANYL-BUPIVACAINE-NACL 0.5-0.125-0.9 MG/250ML-% EP SOLN
12.0000 mL/h | EPIDURAL | Status: DC | PRN
  Administered 2023-02-16: 12 mL/h via EPIDURAL
  Filled 2023-02-16 (×2): qty 250

## 2023-02-16 MED ORDER — WITCH HAZEL-GLYCERIN EX PADS: 1.0000 | MEDICATED_PAD | CUTANEOUS | Status: DC | PRN

## 2023-02-16 MED ORDER — BENZOCAINE-MENTHOL 20-0.5 % EX AERO: 1.0000 | INHALATION_SPRAY | CUTANEOUS | Status: DC | PRN

## 2023-02-16 MED ORDER — LACTATED RINGERS IV SOLN: 500.0000 mL | Freq: Once | INTRAVENOUS | Status: DC

## 2023-02-16 MED ORDER — SODIUM CHLORIDE 0.9% FLUSH: 3.0000 mL | INTRAVENOUS | Status: DC | PRN

## 2023-02-16 MED ORDER — ACETAMINOPHEN 325 MG PO TABS
650.0000 mg | ORAL_TABLET | ORAL | Status: DC | PRN
  Administered 2023-02-16 (×2): 650 mg via ORAL
  Filled 2023-02-16 (×3): qty 2

## 2023-02-16 MED ORDER — ZOLPIDEM TARTRATE 5 MG PO TABS: 5.0000 mg | ORAL_TABLET | Freq: Every evening | ORAL | Status: DC | PRN

## 2023-02-16 MED ORDER — PHENYLEPHRINE 80 MCG/ML (10ML) SYRINGE FOR IV PUSH (FOR BLOOD PRESSURE SUPPORT): 80.0000 ug | PREFILLED_SYRINGE | INTRAVENOUS | Status: DC | PRN

## 2023-02-16 MED ORDER — DIPHENHYDRAMINE HCL 25 MG PO CAPS: 25.0000 mg | ORAL_CAPSULE | Freq: Four times a day (QID) | ORAL | Status: DC | PRN

## 2023-02-16 MED ORDER — SODIUM CHLORIDE 0.9% FLUSH
3.0000 mL | Freq: Two times a day (BID) | INTRAVENOUS | Status: DC
  Administered 2023-02-16: 3 mL via INTRAVENOUS

## 2023-02-16 MED ORDER — PRENATAL MULTIVITAMIN CH
1.0000 | ORAL_TABLET | Freq: Every day | ORAL | Status: DC
  Administered 2023-02-16 – 2023-02-17 (×2): 1 via ORAL
  Filled 2023-02-16 (×2): qty 1

## 2023-02-16 MED ORDER — COCONUT OIL OIL: 1.0000 | TOPICAL_OIL | Status: DC | PRN

## 2023-02-16 MED ORDER — SENNOSIDES-DOCUSATE SODIUM 8.6-50 MG PO TABS
2.0000 | ORAL_TABLET | ORAL | Status: DC
  Administered 2023-02-16: 2 via ORAL
  Filled 2023-02-16: qty 2

## 2023-02-16 MED ORDER — IBUPROFEN 600 MG PO TABS
600.0000 mg | ORAL_TABLET | Freq: Four times a day (QID) | ORAL | Status: DC
  Administered 2023-02-16 – 2023-02-17 (×5): 600 mg via ORAL
  Filled 2023-02-16 (×5): qty 1

## 2023-02-16 MED ORDER — DIPHENHYDRAMINE HCL 50 MG/ML IJ SOLN: 12.5000 mg | INTRAMUSCULAR | Status: DC | PRN

## 2023-02-16 NOTE — Discharge Summary (Signed)
Postpartum Discharge Summary  Date of Service updated-10/19     Patient Name: Jennifer [name] DOB: 1990-07-05 MRN: [id number]  Date of admission: 02/15/2023 Delivery date:02/16/2023 Delivering provider: Keiona Jenison C Date of discharge: 02/17/2023  Admitting diagnosis: Indication for care in labor or delivery [O75.9] Intrauterine pregnancy: [redacted]w[redacted]d     Secondary diagnosis:  Principal Problem:   Indication for care in labor or delivery  Additional problems: none    Discharge diagnosis: Term Pregnancy Delivered                                              Post partum procedures: none Augmentation: AROM, Pitocin, and Cytotec Complications: None  Hospital course: Induction of Labor With Vaginal Delivery   32 y.o. yo [id number] at [redacted]w[redacted]d was admitted to the hospital 02/15/2023 for induction of labor.  Indication for induction:  suspected LGA .  Patient had an labor course complicated by n/a Membrane Rupture Time/Date: 6:56 AM,02/16/2023  Delivery Method:Vaginal, Spontaneous Operative Delivery:N/A Episiotomy: None Lacerations:  None Details of delivery can be found in separate delivery note.  Patient had a postpartum course that was uncomplicated.  Patient is discharged home 02/17/23.  Newborn Data: Birth date:02/16/2023 Birth time:9:40 AM Gender:Female Living status:Living Apgars:8 ,9  Weight:3220 g  Magnesium Sulfate received: No BMZ received: No Rhophylac:N/A MMR:N/A T-DaP: declined prenatally  Flu: No RSV Vaccine received: No Transfusion:No  Immunizations received: Immunization History  Administered Date(s) Administered   Tdap 10/17/2015    Physical exam  Vitals:   02/16/23 1623 02/16/23 2106 02/17/23 0051 02/17/23 0600  BP: 114/71 117/69 (!) 98/49 111/66  Pulse: 92 77 77   Resp: 18 16 16 16   Temp: 98.3 F (36.8 C) 98.7 F (37.1 C) 98.6 F (37 C) 98.3 F (36.8 C)  TempSrc: Oral Oral Oral Oral  SpO2: 100% 100% 98% 100%  Weight:      Height:        General: alert, cooperative, and no distress Lochia: appropriate Uterine Fundus: firm Incision: N/A DVT Evaluation: No evidence of DVT seen on physical exam. Labs: Lab Results  Component Value Date   WBC 7.7 02/17/2023   HGB 7.8 (L) 02/17/2023   HCT 24.5 (L) 02/17/2023   MCV 87.2 02/17/2023   PLT 163 02/17/2023      Latest Ref Rng & Units 10/06/2022    9:30 PM  CMP  Glucose 70 - 99 mg/dL 387   BUN 6 - 20 mg/dL 11   Creatinine 5.64 - 1.00 mg/dL 3.32   Sodium 951 - 884 mmol/L 133   Potassium 3.5 - 5.1 mmol/L 3.2   Chloride 98 - 111 mmol/L 103   CO2 22 - 32 mmol/L 21   Calcium 8.9 - 10.3 mg/dL 8.8   Total Protein 6.5 - 8.1 g/dL 6.4   Total Bilirubin 0.3 - 1.2 mg/dL 0.4   Alkaline Phos 38 - 126 U/L 87   AST 15 - 41 U/L 19   ALT 0 - 44 U/L 17    Edinburgh Score:    02/16/2023   12:31 PM  Edinburgh Postnatal Depression Scale Screening Tool  I have been able to laugh and see the funny side of things. --   No data recorded  After visit meds:  Allergies as of 02/17/2023   No Known Allergies      Medication List  STOP taking these medications    Blood Pressure Kit Devi   cephALEXin 250 MG capsule Commonly known as: Keflex   multivitamin-prenatal 27-0.8 MG Tabs tablet   phenazopyridine 100 MG tablet Commonly known as: Pyridium   terconazole 0.4 % vaginal cream Commonly known as: TERAZOL 7       TAKE these medications    acetaminophen 325 MG tablet Commonly known as: Tylenol Take 2 tablets (650 mg total) by mouth every 6 (six) hours as needed for moderate pain (pain score 4-6) (for pain scale < 4).   ibuprofen 600 MG tablet Commonly known as: ADVIL Take 1 tablet (600 mg total) by mouth every 6 (six) hours.   norethindrone 0.35 MG tablet Commonly known as: Ortho Micronor Take 1 tablet (0.35 mg total) by mouth daily.         Discharge home in stable condition Infant Feeding: Breast Infant Disposition:home with mother Discharge  instruction: per After Visit Summary and Postpartum booklet. Activity: Advance as tolerated. Pelvic rest for 6 weeks.  Diet: routine diet Future Appointments: Future Appointments  Date Time Provider Department Center  03/19/2023  3:50 PM Adam Phenix, MD [facility] None   Follow up Visit:  Follow-up Information     [facility]. Go in 6 week(s).   Specialty: Obstetrics and Gynecology Why: Follow up for postpartum visit Contact information: [address] [place] [address] [phone]               Sent message 10/18 to [facility]  Please schedule this patient for a In person postpartum visit in 4 weeks with the following provider: Any provider. Additional Postpartum F/U: n/a   Low risk pregnancy complicated by:  n/a  Delivery mode:  Vaginal, Spontaneous Anticipated Birth Control:  POPs   02/17/2023 Sharon Seller, DO

## 2023-02-16 NOTE — Anesthesia Procedure Notes (Signed)
Epidural Patient location during procedure: OB Start time: 02/16/2023 7:43 AM End time: 02/16/2023 7:45 AM  Staffing Anesthesiologist: Leilani Able, MD Performed: anesthesiologist   Preanesthetic Checklist Completed: patient identified, IV checked, site marked, risks and benefits discussed, surgical consent, monitors and equipment checked, pre-op evaluation and timeout performed  Epidural Patient position: sitting Prep: DuraPrep and site prepped and draped Patient monitoring: continuous pulse ox and blood pressure Approach: midline Location: L3-L4 Injection technique: LOR air  Needle:  Needle type: Tuohy  Needle gauge: 17 G Needle length: 9 cm and 9 Needle insertion depth: 4 cm Catheter type: closed end flexible Catheter size: 19 Gauge Catheter at skin depth: 9 cm Test dose: negative and Other  Assessment Events: blood not aspirated, no cerebrospinal fluid, injection not painful, no injection resistance, no paresthesia and negative IV test  Additional Notes Reason for block:procedure for pain

## 2023-02-16 NOTE — Progress Notes (Signed)
Patient Vitals for the past 4 hrs:  BP Temp Temp src Pulse Resp  02/16/23 0729 126/68 97.8 F (36.6 C) Oral 93 20  02/16/23 0513 113/61 -- -- 77 18  02/16/23 0447 (!) 101/51 -- -- 80 18   Getting ready for epidural, mainly so she can rest as contractions still aren't very strong. AROM around 0700, pitocin at 12 mu/min. FHR Cat 1. Continue present mgt.

## 2023-02-16 NOTE — Progress Notes (Signed)
Pt requested epidural. After RN left room, pt decided that she wanted to proceed with artifical rupture of membranes without epidural. Drenda Freeze CNM notified.

## 2023-02-16 NOTE — Progress Notes (Addendum)
Labor Progress Note  Jennifer [name] is a 32 y.o. [phone] at [redacted]w[redacted]d presented for IOL for suspecting LGA (proven 3884g).   S: Patient is sitting comfortably with epidural.   O:  BP 103/65   Pulse 87   Temp 97.8 F (36.6 C) (Oral)   Resp 18   Ht 5' 5.5" (1.664 m)   Wt 76.2 kg   LMP 05/16/2022   SpO2 100%   BMI 27.53 kg/m  EFM:150bpm/Moderate variability/ 15x15 accels/ Variable decels CAT: 1 Toco: regular, every 2-3 minutes   CVE: Dilation: 6 Effacement (%): 90 Cervical Position: Posterior Station: -2 Presentation: Vertex Exam by:: Johnathan Hausen RN   A&P: 32 y.o. [id number] [redacted]w[redacted]d here for IOL as above.  #Labor: Progressing well. Some bloody show. AROM around 0700. Pitocin currently at 12. Continue to titrate as needed.  #Pain: Epidural #FWB: CAT 1 #GBS positive  Earlie Server, Student-PA [facility] 02/16/23  8:59 AM  Evaluation and management procedures were performed by the PA Student under my supervision. I was immediately available for direct supervision, assistance and direction throughout this encounter.  I also confirm that I have verified the information documented in the student's note, and that I have also personally reperformed the pertinent components of the physical exam and all of the medical decision making activities.  I have also made any necessary editorial changes.   Mittie Bodo, MD Family Medicine - Obstetrics Fellow

## 2023-02-16 NOTE — Lactation Note (Signed)
This note was copied from a baby's chart. Lactation Consultation Note  Patient Name: Jennifer [name] Date: 02/16/2023 Age:31 hours  Reason for consult: Initial assessment;Term  P4, [redacted]w[redacted]d  Initial LC visit to see P4 mother with breastfeeding experience. She had several visitors in the room. Baby is sleeping currently. Mother reports baby has latched and fed well since delivery.  Mother encouraged to latch baby with feeding cues, place baby skin to skin if not latching, and call for assistance with breastfeeding as needed.   Mom made aware of O/P services, breastfeeding support groups, community resources, and our phone # for post-discharge questions.    Maternal Data Has patient been taught Hand Expression?: No Does the patient have breastfeeding experience prior to this delivery?: Yes  Feeding Mother's Current Feeding Choice: Breast Milk   Interventions Interventions: Education  Discharge Pump: DEBP;Personal  Consult Status Consult Status: Follow-up Date: 02/17/23 Follow-up type: In-patient    Omar Person, RN, IBCLC 02/16/2023, 5:27 PM

## 2023-02-16 NOTE — Anesthesia Preprocedure Evaluation (Signed)
Anesthesia Evaluation  Patient identified by MRN, date of birth, ID band Patient awake    Reviewed: Allergy & Precautions, H&P , NPO status , Patient's Chart, lab work & pertinent test results  Airway Mallampati: I       Dental no notable dental hx.    Pulmonary former smoker   Pulmonary exam normal        Cardiovascular negative cardio ROS Normal cardiovascular exam     Neuro/Psych negative neurological ROS  negative psych ROS   GI/Hepatic negative GI ROS, Neg liver ROS,,,  Endo/Other  negative endocrine ROS    Renal/GU   negative genitourinary   Musculoskeletal negative musculoskeletal ROS (+)    Abdominal Normal abdominal exam  (+)   Peds  Hematology negative hematology ROS (+)   Anesthesia Other Findings   Reproductive/Obstetrics (+) Pregnancy                             Anesthesia Physical Anesthesia Plan  ASA: 2  Anesthesia Plan: Epidural   Post-op Pain Management:    Induction:   PONV Risk Score and Plan:   Airway Management Planned:   Additional Equipment:   Intra-op Plan:   Post-operative Plan:   Informed Consent: I have reviewed the patients History and Physical, chart, labs and discussed the procedure including the risks, benefits and alternatives for the proposed anesthesia with the patient or authorized representative who has indicated his/her understanding and acceptance.       Plan Discussed with:   Anesthesia Plan Comments:        Anesthesia Quick Evaluation

## 2023-02-17 LAB — CBC
HCT: 24.5 % — ABNORMAL LOW (ref 36.0–46.0)
Hemoglobin: 7.8 g/dL — ABNORMAL LOW (ref 12.0–15.0)
MCH: 27.8 pg (ref 26.0–34.0)
MCHC: 31.8 g/dL (ref 30.0–36.0)
MCV: 87.2 fL (ref 80.0–100.0)
Platelets: 163 10*3/uL (ref 150–400)
RBC: 2.81 MIL/uL — ABNORMAL LOW (ref 3.87–5.11)
RDW: 13 % (ref 11.5–15.5)
WBC: 7.7 10*3/uL (ref 4.0–10.5)
nRBC: 0 % (ref 0.0–0.2)

## 2023-02-17 MED ORDER — NORETHINDRONE 0.35 MG PO TABS: 1.0000 | ORAL_TABLET | Freq: Every day | ORAL | 4 refills | Status: DC

## 2023-02-17 MED ORDER — ACETAMINOPHEN 325 MG PO TABS: 650.0000 mg | ORAL_TABLET | Freq: Four times a day (QID) | ORAL | 0 refills | Status: AC | PRN

## 2023-02-17 MED ORDER — IBUPROFEN 600 MG PO TABS: 600.0000 mg | ORAL_TABLET | Freq: Four times a day (QID) | ORAL | 0 refills | Status: DC

## 2023-02-17 NOTE — Lactation Note (Signed)
This note was copied from a baby's chart. Lactation Consultation Note  Patient Name: Jennifer [name] [id number] Date: 02/17/2023 Age:31 hours Reason for consult: Follow-up assessment;Term  P4- MOB states that feedings are going well and denies having any questions or concerns at this time. MOB plans to primarily breastfeed, but pump some later on. LC offered to flange size her, but she declined. LC reviewed how to fit herself for the correct flange size. MOB verbalized understanding.  LC reviewed CDC milk storage guidelines, LC services handout, and engorgement/breast care. LC encouraged MOB to call for further assistance as needed.  Maternal Data Does the patient have breastfeeding experience prior to this delivery?: Yes How long did the patient breastfeed?: last child she breast fed for a year  Feeding Mother's Current Feeding Choice: Breast Milk  LATCH Score Latch: Grasps breast easily, tongue down, lips flanged, rhythmical sucking.  Audible Swallowing: A few with stimulation  Type of Nipple: Everted at rest and after stimulation  Comfort (Breast/Nipple): Soft / non-tender  Hold (Positioning): No assistance needed to correctly position infant at breast.  LATCH Score: 9   Lactation Tools Discussed/Used Pump Education: Milk Storage  Interventions Interventions: Breast feeding basics reviewed;Education;LC Services brochure  Discharge Discharge Education: Engorgement and breast care;Warning signs for feeding baby Pump: Hands Free;Personal  Consult Status Consult Status: Complete Date: 02/17/23    Dema Severin BS, IBCLC 02/17/2023, 12:02 PM

## 2023-02-17 NOTE — Anesthesia Postprocedure Evaluation (Signed)
Anesthesia Post Note  Patient: Jennifer [name]  Procedure(s) Performed: AN AD HOC LABOR EPIDURAL     Patient location during evaluation: Mother Baby Anesthesia Type: Epidural Level of consciousness: awake and alert and oriented Pain management: satisfactory to patient Vital Signs Assessment: post-procedure vital signs reviewed and stable Respiratory status: respiratory function stable Cardiovascular status: stable Postop Assessment: no headache, no backache, epidural receding, patient able to bend at knees, no signs of nausea or vomiting, adequate PO intake and able to ambulate Anesthetic complications: no   No notable events documented.  Last Vitals:  Vitals:   02/17/23 0051 02/17/23 0600  BP: (!) 98/49 111/66  Pulse: 77   Resp: 16 16  Temp: 37 C 36.8 C  SpO2: 98% 100%    Last Pain:  Vitals:   02/17/23 0738  TempSrc:   PainSc: 0-No pain   Pain Goal:                   Britiany Silbernagel

## 2023-02-19 ENCOUNTER — Encounter: Payer: Medicaid Other | Admitting: Obstetrics and Gynecology

## 2023-02-26 ENCOUNTER — Encounter: Payer: Medicaid Other | Admitting: Obstetrics and Gynecology

## 2023-03-10 ENCOUNTER — Telehealth (HOSPITAL_COMMUNITY): Payer: Self-pay

## 2023-03-10 NOTE — Telephone Encounter (Signed)
03/10/2023 1211  Name: Jennifer [name] MRN: [id number] DOB: 12-02-1990  Reason for Call:  Transition of Care Hospital Discharge Call  Contact Status: Patient Contact Status: Message  Language assistant needed: Interpreter Mode: Interpreter Not Needed        Follow-Up Questions:    Inocente Salles Postnatal Depression Scale:  In the Past 7 Days:    PHQ2-9 Depression Scale:     Discharge Follow-up:    Post-discharge interventions: NA  Signature  Signe Colt

## 2023-03-19 ENCOUNTER — Ambulatory Visit: Payer: Medicaid Other | Admitting: Obstetrics & Gynecology

## 2023-03-22 ENCOUNTER — Ambulatory Visit (INDEPENDENT_AMBULATORY_CARE_PROVIDER_SITE_OTHER): Payer: Medicaid Other | Admitting: Obstetrics

## 2023-03-22 ENCOUNTER — Encounter: Payer: Self-pay | Admitting: Obstetrics

## 2023-03-22 NOTE — Progress Notes (Signed)
..   Post Partum Visit Note  Jennifer [name] is a 32 y.o. [phone] female who presents for a postpartum visit. She is 5 weeks postpartum following a normal spontaneous vaginal delivery.  I have fully reviewed the prenatal and intrapartum course. The delivery was at 39.3 gestational weeks.  Anesthesia: epidural. Postpartum course has been good. Baby is doing well. Baby is feeding by breast. Bleeding no bleeding. Bowel function is normal. Bladder function is normal. Patient is sexually active. Contraception method is oral progesterone-only contraceptive. Postpartum depression screening: negative.   The pregnancy intention screening data noted above was reviewed. Potential methods of contraception were discussed. The patient elected to proceed with No data recorded.   Edinburgh Postnatal Depression Scale - 03/22/23 1513       Edinburgh Postnatal Depression Scale:  In the Past 7 Days   I have been able to laugh and see the funny side of things. 0    I have looked forward with enjoyment to things. 0    I have blamed myself unnecessarily when things went wrong. 0    I have been anxious or worried for no good reason. 0    I have felt scared or panicky for no good reason. 0    Things have been getting on top of me. 0    I have been so unhappy that I have had difficulty sleeping. 0    I have felt sad or miserable. 0    I have been so unhappy that I have been crying. 0    The thought of harming myself has occurred to me. 0    Edinburgh Postnatal Depression Scale Total 0             Health Maintenance Due  Topic Date Due   COVID-19 Vaccine (1 - 2023-24 season) Never done    The following portions of the patient's history were reviewed and updated as appropriate: allergies, current medications, past family history, past medical history, past social history, past surgical history, and problem list.  Review of Systems A comprehensive review of systems was negative.  Objective:  BP (!)  109/59   Pulse 69   Wt 153 lb 4.8 oz (69.5 kg)   LMP 05/16/2022   Breastfeeding Yes   BMI 25.12 kg/m    General:  alert and no distress   Breasts:  normal  Lungs: clear to auscultation bilaterally  Heart:  regular rate and rhythm, S1, S2 normal, no murmur, click, rub or gallop  Abdomen: soft, non-tender; bowel sounds normal; no masses,  no organomegaly   Wound none  GU exam:  normal       Assessment:    1. Postpartum care following vaginal delivery - doing well   Plan:   Essential components of care per ACOG recommendations:  1.  Mood and well being: Patient with negative depression screening today. Reviewed local resources for support.  - Patient tobacco use? No.   - hx of drug use? No.    2. Infant care and feeding:  -Patient currently breastmilk feeding? Yes. Discussed returning to work and pumping. Reviewed importance of draining breast regularly to support lactation.  -Social determinants of health (SDOH) reviewed in EPIC. No concerns.  3. Sexuality, contraception and birth spacing - Patient does not want a pregnancy in the next year.  Desired family size is 4 children.  - Reviewed reproductive life planning. Reviewed contraceptive methods based on pt preferences and effectiveness.  Patient desired Oral Contraceptive today.   -  Discussed birth spacing of 18 months  4. Sleep and fatigue -Encouraged family/partner/community support of 4 hrs of uninterrupted sleep to help with mood and fatigue  5. Physical Recovery  - Discussed patients delivery and complications. She describes her labor as good. - Patient had a Vaginal, no problems at delivery. Patient had no laceration.  - Patient has urinary incontinence? No. - Patient is safe to resume physical and sexual activity  6.  Health Maintenance - HM due items addressed Yes - Last pap smear  Diagnosis  Date Value Ref Range Status  10/19/2020   Final   - Negative for intraepithelial lesion or malignancy (NILM)   Pap  smear not done at today's visit.  -Breast Cancer screening indicated? No.   7. Chronic Disease/Pregnancy Condition follow up: None   Coral Ceo, MD Center for [facility], [facility], [place] 03/21/2033

## 2023-04-04 ENCOUNTER — Other Ambulatory Visit (HOSPITAL_COMMUNITY)
Admission: RE | Admit: 2023-04-04 | Discharge: 2023-04-04 | Disposition: A | Discharge status: Home / Self Care | Payer: Medicaid Other | Source: Ambulatory Visit | Attending: Obstetrics & Gynecology | Admitting: Obstetrics & Gynecology

## 2023-04-04 ENCOUNTER — Ambulatory Visit: Payer: Medicaid Other | Admitting: General Practice

## 2023-04-04 VITALS — BP 115/78 | HR 74 | Ht 66.0 in | Wt 150.0 lb

## 2023-04-04 DIAGNOSIS — N898 Other specified noninflammatory disorders of vagina: Secondary | ICD-10-CM | POA: Insufficient documentation

## 2023-04-04 NOTE — Progress Notes (Signed)
SUBJECTIVE:  32 y.o. female complains of copious yellow vaginal discharge for 3 day(s). Denies abnormal vaginal bleeding or significant pelvic pain or fever. No UTI symptoms. Denies history of known exposure to STD.  Patient's last menstrual period was 05/16/2022.  OBJECTIVE:  She appears well, afebrile. Urine dipstick: not done.  ASSESSMENT:  Vaginal Discharge  Vaginal Odor   PLAN:  GC, chlamydia, trichomonas, BVAG, CVAG probe sent to lab. Treatment: To be determined once lab results are received ROV prn if symptoms persist or worsen.

## 2023-04-05 LAB — CERVICOVAGINAL ANCILLARY ONLY
Candida Glabrata: NEGATIVE
Candida Vaginitis: NEGATIVE
Chlamydia: NEGATIVE
Comment: NEGATIVE
Comment: NEGATIVE
Comment: NEGATIVE
Comment: NEGATIVE
Comment: NORMAL
Neisseria Gonorrhea: NEGATIVE
Trichomonas: NEGATIVE

## 2023-05-25 ENCOUNTER — Ambulatory Visit: Payer: Medicaid Other

## 2023-05-29 ENCOUNTER — Other Ambulatory Visit (HOSPITAL_COMMUNITY)
Admission: RE | Admit: 2023-05-29 | Discharge: 2023-05-29 | Disposition: A | Discharge status: Home / Self Care | Payer: Medicaid Other | Source: Ambulatory Visit | Attending: Obstetrics and Gynecology | Admitting: Obstetrics and Gynecology

## 2023-05-29 ENCOUNTER — Ambulatory Visit (INDEPENDENT_AMBULATORY_CARE_PROVIDER_SITE_OTHER): Payer: Medicaid Other | Admitting: General Practice

## 2023-05-29 DIAGNOSIS — N898 Other specified noninflammatory disorders of vagina: Secondary | ICD-10-CM | POA: Diagnosis present

## 2023-05-29 DIAGNOSIS — Z3202 Encounter for pregnancy test, result negative: Secondary | ICD-10-CM | POA: Diagnosis not present

## 2023-05-29 LAB — POCT URINALYSIS DIPSTICK
Bilirubin, UA: NEGATIVE
Glucose, UA: NEGATIVE
Ketones, UA: NEGATIVE
Leukocytes, UA: NEGATIVE
Nitrite, UA: NEGATIVE
Protein, UA: NEGATIVE
Spec Grav, UA: 1.025 (ref 1.010–1.025)
Urobilinogen, UA: 0.2 U/dL
pH, UA: 6 (ref 5.0–8.0)

## 2023-05-29 LAB — POCT URINE PREGNANCY: Preg Test, Ur: NEGATIVE

## 2023-05-29 NOTE — Progress Notes (Signed)
SUBJECTIVE:  33 y.o. female complains of malodorous vaginal discharge for 4-5 days. Denies abnormal vaginal bleeding or significant pelvic pain or fever. No UTI symptoms. Denies history of known exposure to STD.  No LMP recorded.  OBJECTIVE:  She appears well, afebrile. Urine dipstick: positive for RBC's.  ASSESSMENT:  Vaginal Discharge  Vaginal Odor   PLAN:  GC, chlamydia, trichomonas, BVAG, CVAG probe sent to lab. Treatment: To be determined once lab results are received ROV prn if symptoms persist or worsen.   Negative UPT in office today

## 2023-05-30 LAB — CERVICOVAGINAL ANCILLARY ONLY
Bacterial Vaginitis (gardnerella): POSITIVE — AB
Candida Glabrata: NEGATIVE
Candida Vaginitis: NEGATIVE
Chlamydia: NEGATIVE
Comment: NEGATIVE
Comment: NEGATIVE
Comment: NEGATIVE
Comment: NEGATIVE
Comment: NEGATIVE
Comment: NORMAL
Neisseria Gonorrhea: NEGATIVE
Trichomonas: NEGATIVE

## 2023-05-31 ENCOUNTER — Other Ambulatory Visit (INDEPENDENT_AMBULATORY_CARE_PROVIDER_SITE_OTHER): Payer: Self-pay | Admitting: Obstetrics & Gynecology

## 2023-05-31 LAB — URINE CULTURE

## 2023-06-01 ENCOUNTER — Encounter: Payer: Self-pay | Admitting: Obstetrics & Gynecology

## 2023-06-01 ENCOUNTER — Other Ambulatory Visit: Payer: Self-pay | Admitting: Obstetrics and Gynecology

## 2023-06-01 ENCOUNTER — Encounter: Payer: Self-pay | Admitting: Obstetrics and Gynecology

## 2023-06-01 DIAGNOSIS — N760 Acute vaginitis: Secondary | ICD-10-CM

## 2023-06-01 MED ORDER — METRONIDAZOLE 500 MG PO TABS
500.0000 mg | ORAL_TABLET | Freq: Two times a day (BID) | ORAL | 0 refills | Status: AC
Start: 2023-06-01 — End: 2023-06-08

## 2023-06-20 ENCOUNTER — Encounter: Payer: Self-pay | Admitting: Obstetrics and Gynecology

## 2023-07-03 ENCOUNTER — Ambulatory Visit: Payer: Medicaid Other

## 2023-07-03 ENCOUNTER — Other Ambulatory Visit (HOSPITAL_COMMUNITY)
Admission: RE | Admit: 2023-07-03 | Discharge: 2023-07-03 | Disposition: A | Discharge status: Home / Self Care | Source: Ambulatory Visit | Attending: Obstetrics and Gynecology | Admitting: Obstetrics and Gynecology

## 2023-07-03 VITALS — BP 115/74 | HR 76

## 2023-07-03 DIAGNOSIS — N898 Other specified noninflammatory disorders of vagina: Secondary | ICD-10-CM

## 2023-07-03 DIAGNOSIS — R300 Dysuria: Secondary | ICD-10-CM | POA: Diagnosis not present

## 2023-07-03 LAB — POCT URINALYSIS DIPSTICK
Bilirubin, UA: NEGATIVE
Glucose, UA: NEGATIVE
Ketones, UA: NEGATIVE
Leukocytes, UA: NEGATIVE
Protein, UA: NEGATIVE
Spec Grav, UA: 1.01 (ref 1.010–1.025)
Urobilinogen, UA: 0.2 U/dL
pH, UA: 6 (ref 5.0–8.0)

## 2023-07-03 MED ORDER — NITROFURANTOIN MONOHYD MACRO 100 MG PO CAPS: 100.0000 mg | ORAL_CAPSULE | Freq: Two times a day (BID) | ORAL | 0 refills | Status: DC

## 2023-07-03 NOTE — Progress Notes (Signed)
 SUBJECTIVE:  33 y.o. female complains of white vaginal discharge for 3 day(s) and dysuria. Just finished treatment for BV but still has discharge along with odor so believes it has not gone away. Denies abnormal vaginal bleeding or significant pelvic pain or fever. Denies history of known exposure to STD.  LMP Unknown (breastfeeding).   OBJECTIVE:  She appears alert, well appearing, in no apparent distress Urine dipstick: positive for RBC's, nitrates.  ASSESSMENT:  Vaginal Discharge  Vaginal Odor Dysuria    PLAN:  GC, chlamydia, trichomonas, BVAG, CVAG probe and urine culture sent to lab. Treatment: To be determined once lab results are received ROV prn if symptoms persist or worsen.

## 2023-07-04 LAB — CERVICOVAGINAL ANCILLARY ONLY
Bacterial Vaginitis (gardnerella): NEGATIVE
Candida Glabrata: NEGATIVE
Candida Vaginitis: NEGATIVE
Chlamydia: NEGATIVE
Comment: NEGATIVE
Comment: NEGATIVE
Comment: NEGATIVE
Comment: NEGATIVE
Comment: NEGATIVE
Comment: NORMAL
Neisseria Gonorrhea: NEGATIVE
Trichomonas: NEGATIVE

## 2023-07-05 ENCOUNTER — Encounter: Payer: Self-pay | Admitting: Obstetrics & Gynecology

## 2023-07-06 LAB — URINE CULTURE

## 2023-07-09 ENCOUNTER — Encounter: Payer: Self-pay | Admitting: Obstetrics & Gynecology

## 2023-08-01 ENCOUNTER — Encounter: Payer: Self-pay | Admitting: Obstetrics & Gynecology

## 2023-08-01 ENCOUNTER — Ambulatory Visit

## 2023-08-01 ENCOUNTER — Other Ambulatory Visit (HOSPITAL_COMMUNITY)
Admission: RE | Admit: 2023-08-01 | Discharge: 2023-08-01 | Disposition: A | Discharge status: Home / Self Care | Source: Ambulatory Visit | Attending: Obstetrics and Gynecology | Admitting: Obstetrics and Gynecology

## 2023-08-01 VITALS — BP 107/81 | HR 78

## 2023-08-01 DIAGNOSIS — N898 Other specified noninflammatory disorders of vagina: Secondary | ICD-10-CM

## 2023-08-01 DIAGNOSIS — R300 Dysuria: Secondary | ICD-10-CM

## 2023-08-01 LAB — POCT URINALYSIS DIPSTICK
Bilirubin, UA: NEGATIVE
Blood, UA: NEGATIVE
Glucose, UA: NEGATIVE
Leukocytes, UA: NEGATIVE
Nitrite, UA: NEGATIVE
Protein, UA: NEGATIVE
Spec Grav, UA: 1.02 (ref 1.010–1.025)
Urobilinogen, UA: 0.2 U/dL
pH, UA: 6 (ref 5.0–8.0)

## 2023-08-01 NOTE — Progress Notes (Signed)
 SUBJECTIVE:  33 y.o. female complains of white vaginal discharge and dysuria. Has had previous UTI/BV, and unsure if it is one or both of these currently.  Denies abnormal vaginal bleeding or significant pelvic pain or fever.Denies history of known exposure to STD.  No LMP recorded.  OBJECTIVE:  She appears alert, well appearing, in no apparent distress Urine dipstick: positive for ketones.  ASSESSMENT:  Vaginal Discharge  Vaginal Odor Dysuria    PLAN:  GC, chlamydia, trichomonas, BVAG, CVAG probe and urine culture sent to lab. Treatment: To be determined once lab results are received ROV prn if symptoms persist or worsen.

## 2023-08-02 ENCOUNTER — Other Ambulatory Visit: Payer: Self-pay

## 2023-08-02 LAB — CERVICOVAGINAL ANCILLARY ONLY
Bacterial Vaginitis (gardnerella): POSITIVE — AB
Candida Glabrata: NEGATIVE
Candida Vaginitis: NEGATIVE
Chlamydia: NEGATIVE
Comment: NEGATIVE
Comment: NEGATIVE
Comment: NEGATIVE
Comment: NEGATIVE
Comment: NEGATIVE
Comment: NORMAL
Neisseria Gonorrhea: NEGATIVE
Trichomonas: NEGATIVE

## 2023-08-02 MED ORDER — METRONIDAZOLE 0.75 % VA GEL: 1.0000 | Freq: Every day | VAGINAL | 1 refills | Status: DC

## 2023-08-02 NOTE — Progress Notes (Signed)
Metrogel sent for BV per protocol.

## 2023-08-05 LAB — URINE CULTURE

## 2023-08-07 ENCOUNTER — Other Ambulatory Visit: Payer: Self-pay

## 2023-08-07 ENCOUNTER — Other Ambulatory Visit: Payer: Self-pay | Admitting: Obstetrics and Gynecology

## 2023-08-07 DIAGNOSIS — N3000 Acute cystitis without hematuria: Secondary | ICD-10-CM

## 2023-08-07 MED ORDER — CEPHALEXIN 500 MG PO CAPS: 500.0000 mg | ORAL_CAPSULE | Freq: Two times a day (BID) | ORAL | 0 refills | Status: AC

## 2023-08-07 NOTE — Progress Notes (Signed)
 Rx sent in for keflex. Patient previously treated with macrobid in March with inadequate response per pt. Currently breastfeeding.

## 2023-08-07 NOTE — Progress Notes (Signed)
 TC from pt wanting rx sent in for uti. Culture came back + e. Coli. Pt treated in early march with macrobid for same on urine culture, uti never resolved. Secure chat to dr. Shea Evans to advise on medication to send pt.

## 2023-08-28 ENCOUNTER — Other Ambulatory Visit (HOSPITAL_COMMUNITY)
Admission: RE | Admit: 2023-08-28 | Discharge: 2023-08-28 | Disposition: A | Discharge status: Home / Self Care | Source: Ambulatory Visit | Attending: Obstetrics & Gynecology | Admitting: Obstetrics & Gynecology

## 2023-08-28 ENCOUNTER — Ambulatory Visit (INDEPENDENT_AMBULATORY_CARE_PROVIDER_SITE_OTHER)

## 2023-08-28 VITALS — BP 133/82 | HR 78

## 2023-08-28 DIAGNOSIS — Z3202 Encounter for pregnancy test, result negative: Secondary | ICD-10-CM | POA: Diagnosis not present

## 2023-08-28 DIAGNOSIS — N390 Urinary tract infection, site not specified: Secondary | ICD-10-CM

## 2023-08-28 DIAGNOSIS — N898 Other specified noninflammatory disorders of vagina: Secondary | ICD-10-CM | POA: Insufficient documentation

## 2023-08-28 DIAGNOSIS — N926 Irregular menstruation, unspecified: Secondary | ICD-10-CM

## 2023-08-28 LAB — POCT URINALYSIS DIPSTICK
Bilirubin, UA: NEGATIVE
Glucose, UA: NEGATIVE
Protein, UA: NEGATIVE
Spec Grav, UA: 1.02 (ref 1.010–1.025)
Urobilinogen, UA: 0.2 U/dL
pH, UA: 6.5 (ref 5.0–8.0)

## 2023-08-28 LAB — POCT URINE PREGNANCY: Preg Test, Ur: NEGATIVE

## 2023-08-28 NOTE — Progress Notes (Signed)
 SUBJECTIVE:  33 y.o. female complains of white vaginal discharge for 1 month(s) and dysuria Denies abnormal vaginal bleeding or significant pelvic pain or fever.Denies history of known exposure to STD.  No LMP recorded. (Menstrual status: Lactating).  OBJECTIVE:  She appears alert, well appearing, in no apparent distress Urine dipstick: positive for WBC's, Ketones, RBCs. Pt has constantly been into office for same issue, medication not seeming to alleviate any symptoms. Holding off on macrobid  as pt has already tried this with no relief. Medication prescribed last time with no relief either. Awaiting results of UC and had pt schedule provider visit to address recurrent UTI.   ASSESSMENT:  Vaginal Discharge  Vaginal Irritation/Itching Dysuria    PLAN:  GC, chlamydia, trichomonas, BVAG, CVAG probe and urine culture sent to lab. Treatment: To be determined once lab results are received ROV prn if symptoms persist or worsen.  Gillis Ladd here for a UPT. Pt had a not done upt at home. LMP is unknown. Pt is currently breastfeeding.   UPT in office Negative.

## 2023-08-29 LAB — CERVICOVAGINAL ANCILLARY ONLY
Bacterial Vaginitis (gardnerella): POSITIVE — AB
Candida Glabrata: NEGATIVE
Candida Vaginitis: POSITIVE — AB
Chlamydia: NEGATIVE
Comment: NEGATIVE
Comment: NEGATIVE
Comment: NEGATIVE
Comment: NEGATIVE
Comment: NEGATIVE
Comment: NORMAL
Neisseria Gonorrhea: NEGATIVE
Trichomonas: NEGATIVE

## 2023-09-02 LAB — URINE CULTURE

## 2023-09-07 ENCOUNTER — Ambulatory Visit: Admitting: Obstetrics and Gynecology

## 2023-09-07 ENCOUNTER — Encounter: Payer: Self-pay | Admitting: Obstetrics and Gynecology

## 2023-09-07 ENCOUNTER — Encounter: Payer: Self-pay | Admitting: Obstetrics & Gynecology

## 2023-09-07 VITALS — BP 106/67 | HR 75 | Ht 65.5 in | Wt 140.2 lb

## 2023-09-07 DIAGNOSIS — B3731 Acute candidiasis of vulva and vagina: Secondary | ICD-10-CM

## 2023-09-07 DIAGNOSIS — N760 Acute vaginitis: Secondary | ICD-10-CM | POA: Diagnosis not present

## 2023-09-07 DIAGNOSIS — B9689 Other specified bacterial agents as the cause of diseases classified elsewhere: Secondary | ICD-10-CM | POA: Diagnosis not present

## 2023-09-07 MED ORDER — SULFAMETHOXAZOLE-TRIMETHOPRIM 800-160 MG PO TABS: 1.0000 | ORAL_TABLET | Freq: Two times a day (BID) | ORAL | 1 refills | Status: DC

## 2023-09-07 MED ORDER — METRONIDAZOLE 0.75 % VA GEL: 1.0000 | Freq: Every day | VAGINAL | 1 refills | Status: DC

## 2023-09-07 MED ORDER — FLUCONAZOLE 150 MG PO TABS: 150.0000 mg | ORAL_TABLET | Freq: Once | ORAL | 3 refills | Status: AC

## 2023-09-07 NOTE — Progress Notes (Signed)
 Pt reports recurrent UTI. Last tested 08-28-23, has not received treatment yet.

## 2023-09-07 NOTE — Addendum Note (Signed)
 Addended by: Tresia Fruit on: 09/07/2023 10:31 AM   Modules accepted: Orders

## 2023-09-07 NOTE — Progress Notes (Signed)
 GYNECOLOGY VISIT  Patient name: Jennifer [name] MRN [id number]  Date of birth: Dec 21, 1990 Chief Complaint:   No chief complaint on file.   History:  Jennifer [name] is a 33 y.o. [phone] being seen today for recurrent/chronic UTI.  Discussed the use of AI scribe software for clinical note transcription with the patient, who gave verbal consent to proceed.  History of Present Illness Jennifer [name] is a 33 year old female who presents with persistent urinary tract infection symptoms despite treatment.  She has experienced recurrent urinary tract infections (UTIs) for the last couple of years, including during her pregnancy. Despite being prescribed medication during pregnancy, her symptoms have persisted, with no improvement after the last antibiotic course. She experiences UTIs approximately every six weeks, which she believes are related to sexual intercourse. She has been prescribed Bactrim  in the past, which causes mild discomfort but no significant adverse reactions.  She experiences pain in the lower abdomen and burning with urination. Recently, she had fever and chills, which resolved, but she continues to have back pain. She did not seek medical attention for the fever. No changes in bowel habits, diarrhea, constipation, or irritable bowel syndrome. No pain when not experiencing a UTI.  She is currently breastfeeding and has no issues with bilirubin or G6PD deficiency in her child. She also mentions concerns about bacterial vaginosis (BV) and yeast infections, which she associates with antibiotic use. She has used Metro Gel for BV without issues.   4/29, 4/2, and 3/4: pan sensitive e. coli    Past Medical History:  Diagnosis Date   BV (bacterial vaginosis)    Chlamydia    Nausea and vomiting in pregnancy prior to [redacted] weeks gestation 12/04/2012   Vaginal Pap smear, abnormal     Past Surgical History:  Procedure Laterality Date   CERVICAL BIOPSY  W/ LOOP ELECTRODE  EXCISION     COLPOSCOPY  09/03/2019       LEEP  10/22/2019        The following portions of the patient's history were reviewed and updated as appropriate: allergies, current medications, past family history, past medical history, past social history, past surgical history and problem list.   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   ADEQPAP  06/10/2019 1614    Satisfactory for evaluation; transformation zone component PRESENT.    High Risk HPV: Positive  Adequacy:  Satisfactory for evaluation, transformation zone component PRESENT  Diagnosis:  Atypical squamous cells of undetermined significance (ASC-US )  Last mammogram: n/a   Review of Systems:  Pertinent items are noted in HPI. Comprehensive review of systems was otherwise negative.   Objective:  Physical Exam BP 106/67   Pulse 75   Ht 5' 5.5" (1.664 m)   Wt 140 lb 3.2 oz (63.6 kg)   Breastfeeding Yes   BMI 22.98 kg/m    Physical Exam Vitals and nursing note reviewed.  Constitutional:      Appearance: Normal appearance.  HENT:     Head: Normocephalic and atraumatic.  Pulmonary:     Effort: Pulmonary effort is normal.  Skin:    General: Skin is warm and dry.  Neurological:     General: No focal deficit present.     Mental Status: She is alert.  Psychiatric:        Mood and Affect: Mood normal.        Behavior: Behavior normal.        Thought Content: Thought content normal.        Judgment: Judgment normal.      Assessment & Plan:    Assessment & Plan Recurrent urinary tract infections Recurrent UTIs likely related to sexual  intercourse. Current culture results are pan-sensitive. Discussed Bactrim  safety during breastfeeding and potential for UTIs to be triggered by intercourse. - Prescribed Bactrim  for current UTI. - Advise to monitor for fever or unilateral pain and seek medical attention if these symptoms occur. - Discuss the possibility of prophylactic antibiotics post-intercourse if UTIs are confirmed to be sex-related.  Bacterial vaginosis Bacterial vaginosis present. No issues with Metro Gel use. - Prescribe Metro Gel for bacterial vaginosis.  Yeast infection Yeast infection likely secondary to antibiotic use for UTIs. Discussed potential for yeast infections with antibiotic use and treatment with Diflucan . - Prescribe Diflucan  for yeast infection. Instruct to take one dose and, if symptoms persist after three days, take another dose. - Advise to take another dose of Diflucan  after completing antibiotics if symptoms of yeast infection occur.   Routine preventative health maintenance measures emphasized.  Kiki Pelton, MD Minimally Invasive Gynecologic Surgery Center for [facility], [facility]

## 2023-09-10 ENCOUNTER — Other Ambulatory Visit: Payer: Self-pay

## 2023-09-10 DIAGNOSIS — B3731 Acute candidiasis of vulva and vagina: Secondary | ICD-10-CM

## 2023-09-10 MED ORDER — NYSTATIN 100000 UNIT/GM EX CREA: 1.0000 | TOPICAL_CREAM | Freq: Two times a day (BID) | CUTANEOUS | 0 refills | Status: AC

## 2023-09-10 MED ORDER — FLUCONAZOLE 150 MG PO TABS: 150.0000 mg | ORAL_TABLET | ORAL | 0 refills | Status: AC

## 2023-09-18 ENCOUNTER — Telehealth: Payer: Self-pay

## 2023-09-18 ENCOUNTER — Other Ambulatory Visit: Payer: Self-pay

## 2023-09-18 MED ORDER — NITROFURANTOIN MONOHYD MACRO 100 MG PO CAPS: 100.0000 mg | ORAL_CAPSULE | Freq: Two times a day (BID) | ORAL | 0 refills | Status: DC

## 2023-09-18 NOTE — Telephone Encounter (Signed)
 TC from pt reporting UTI symptoms again, wants to see if she can get rx without having to come into office. Culture always shows E. Coli, had previous visit with Dr. Elester Grim to address this. Secure chat to Dr. Donetta Furl who is in office today for recommendations.

## 2023-10-11 ENCOUNTER — Telehealth: Payer: Self-pay

## 2023-10-11 ENCOUNTER — Other Ambulatory Visit: Payer: Self-pay | Admitting: Obstetrics and Gynecology

## 2023-10-11 DIAGNOSIS — N390 Urinary tract infection, site not specified: Secondary | ICD-10-CM

## 2023-10-11 MED ORDER — NITROFURANTOIN MONOHYD MACRO 100 MG PO CAPS: 100.0000 mg | ORAL_CAPSULE | Freq: Once | ORAL | 2 refills | Status: AC | PRN

## 2023-10-11 NOTE — Telephone Encounter (Signed)
 TC from pt reporting another UTI and wanting to start prophylactic abx as discussed with Dr. Elester Grim. Staff message to Dr. Elester Grim for recs.

## 2023-10-25 ENCOUNTER — Ambulatory Visit: Admitting: Obstetrics and Gynecology

## 2023-10-25 ENCOUNTER — Ambulatory Visit

## 2023-10-25 ENCOUNTER — Other Ambulatory Visit (HOSPITAL_COMMUNITY)
Admission: RE | Admit: 2023-10-25 | Discharge: 2023-10-25 | Disposition: A | Discharge status: Home / Self Care | Source: Ambulatory Visit | Attending: Obstetrics and Gynecology | Admitting: Obstetrics and Gynecology

## 2023-10-25 ENCOUNTER — Encounter: Payer: Self-pay | Admitting: Obstetrics and Gynecology

## 2023-10-25 VITALS — BP 110/75 | HR 74 | Ht 65.5 in | Wt 140.0 lb

## 2023-10-25 DIAGNOSIS — N390 Urinary tract infection, site not specified: Secondary | ICD-10-CM

## 2023-10-25 DIAGNOSIS — Z3202 Encounter for pregnancy test, result negative: Secondary | ICD-10-CM

## 2023-10-25 DIAGNOSIS — N12 Tubulo-interstitial nephritis, not specified as acute or chronic: Secondary | ICD-10-CM | POA: Diagnosis not present

## 2023-10-25 LAB — CERVICOVAGINAL ANCILLARY ONLY
Bacterial Vaginitis (gardnerella): NEGATIVE
Candida Glabrata: NEGATIVE
Candida Vaginitis: NEGATIVE
Chlamydia: NEGATIVE
Comment: NEGATIVE
Comment: NEGATIVE
Comment: NEGATIVE
Comment: NEGATIVE
Comment: NEGATIVE
Comment: NORMAL
Neisseria Gonorrhea: NEGATIVE
Trichomonas: NEGATIVE

## 2023-10-25 LAB — POCT URINALYSIS DIPSTICK
Bilirubin, UA: POSITIVE
Glucose, UA: NEGATIVE
Nitrite, UA: POSITIVE
Protein, UA: POSITIVE — AB
Spec Grav, UA: 1.015 (ref 1.010–1.025)
Urobilinogen, UA: NEGATIVE U/dL — AB
pH, UA: 7.5 (ref 5.0–8.0)

## 2023-10-25 LAB — POCT URINE PREGNANCY: Preg Test, Ur: NEGATIVE

## 2023-10-25 MED ORDER — PREMARIN 0.625 MG/GM VA CREA: 1.0000 | TOPICAL_CREAM | VAGINAL | 1 refills | Status: AC

## 2023-10-25 MED ORDER — SULFAMETHOXAZOLE-TRIMETHOPRIM 800-160 MG PO TABS
1.0000 | ORAL_TABLET | Freq: Two times a day (BID) | ORAL | 0 refills | Status: DC
Start: 2023-10-25 — End: 2024-01-14

## 2023-10-25 MED ORDER — METHENAMINE HIPPURATE 1 G PO TABS: 1.0000 g | ORAL_TABLET | Freq: Two times a day (BID) | ORAL | 1 refills | Status: DC

## 2023-10-25 NOTE — Patient Instructions (Addendum)
 A few things from your visit today:  - Take Bactrim  twice per day for 14 days. The last urine culture showed this antibiotic to be appropriate. If the urine culture from today shows a different bacteria, I'll contact you and send in an appropriate antibiotic - Start taking methenamine twice daily for prevention - Start taking a cranberry supplement [phone] mg) daily. You can also drink a glass of cranberry juice daily if you prefer - You can use the vaginal estrogen cream three times per week. This comes with insertion instructions - I've placed a urology referral for you as well

## 2023-10-25 NOTE — Progress Notes (Signed)
 Pt c./o frequent UTI's. Requesting vaginal swab. No periods since delivery 8 months ago. Negative UPT today

## 2023-10-25 NOTE — Progress Notes (Signed)
 GYNECOLOGY VISIT   History:  Jennifer [name] is a 33 y.o. [phone] female being evaluated today for recurrent UTI. She was initially diagnosed with UTI 6/12 based on symptoms and urine culture. Treated with Macrobid . States symptoms have not improved. Now feels feverish and aching in her back. Does note having intercourse near daily. Urinates afterwards. Does note some vaginal dryness after her last baby. Is 8 months PP and continues to breastfeed. Is taking OCPs for contraception.  Also had UTI 4/29. Culture confirmed pansensitive E. Coli.   Past Medical History:  Diagnosis Date   BV (bacterial vaginosis)    Chlamydia    Nausea and vomiting in pregnancy prior to [redacted] weeks gestation 12/04/2012   Vaginal Pap smear, abnormal    Past Surgical History:  Procedure Laterality Date   CERVICAL BIOPSY  W/ LOOP ELECTRODE EXCISION     COLPOSCOPY  09/03/2019       LEEP  10/22/2019       The following portions of the patient's history were reviewed and updated as appropriate: allergies, current medications, past family history, past medical history, past social history, past surgical history and problem list.   Review of Systems:  Pertinent items noted in HPI and remainder of comprehensive ROS otherwise negative.  Physical Exam:   Vitals:   10/25/23 1030  BP: 110/75  Pulse: 74    Physical Exam Constitutional:      General: She is not in acute distress.    Appearance: She is not ill-appearing.   Cardiovascular:     Rate and Rhythm: Normal rate.  Pulmonary:     Effort: Pulmonary effort is normal.  Abdominal:     Tenderness: There is right CVA tenderness and left CVA tenderness.   Musculoskeletal:        General: No swelling.   Skin:    General: Skin is warm and dry.   Neurological:     General: No focal deficit present.   Psychiatric:        Mood and Affect: Mood normal.   Labs and Imaging Results for orders placed or performed in visit on 10/25/23 (from the past 2  weeks)  POCT urine pregnancy   Collection Time: 10/25/23 10:48 AM  Result Value Ref Range   Preg Test, Ur Negative Negative  POCT Urinalysis Dipstick   Collection Time: 10/25/23 10:49 AM  Result Value Ref Range   Color, UA     Clarity, UA     Glucose, UA Negative Negative   Bilirubin, UA positive    Ketones, UA small    Spec Grav, UA 1.015 1.010 - 1.025   Blood, UA moderate    pH, UA 7.5 5.0 - 8.0   Protein, UA Positive (A) Negative   Urobilinogen, UA negative (A) 0.2 or 1.0 E.U./dL   Nitrite, UA positive    Leukocytes, UA Moderate (2+) (A) Negative   Appearance     Odor     No results found.     Assessment and Plan:     1. Recurrent UTI (Primary) Chronic, uncontrolled. Has had 2 UTIs in 10 weeks. Will proceed with treatment for pyelonephritis and multiple prevention methods. - Methenamine BID - Cranberry juice/supplement daily - Given postpartum vaginal dryness, will additionally trial estrogen vaginal cream - Referral sent to urology    - Cervicovaginal ancillary only( [place]) - POCT Urinalysis Dipstick - Urine Culture - POCT urine pregnancy - Ambulatory referral to Urology  2. Pyelonephritis Diagnosed today with UTI  plus flank pain and subjective fevers. No signs of sepsis currently. Will treat with Bactrim . - sulfamethoxazole -trimethoprim  (BACTRIM  DS) 800-160 MG tablet; Take 1 tablet by mouth 2 (two) times daily.  Dispense: 28 tablet; Refill: 0   The patient was advised to call back/go to the ED if the symptoms worsen or if the condition fails to improve as anticipated.  Almarie CHRISTELLA Moats, MD Center for [employer], [facility]

## 2023-10-26 ENCOUNTER — Ambulatory Visit: Payer: Self-pay | Admitting: Obstetrics and Gynecology

## 2023-10-30 LAB — URINE CULTURE

## 2023-11-13 NOTE — Progress Notes (Signed)
    Chief Complaint: Recurrent UTIs  History of Present Illness:  Jennifer [name] is a 33 y.o. female   Past Medical History:  Past Medical History:  Diagnosis Date   BV (bacterial vaginosis)    Chlamydia    Nausea and vomiting in pregnancy prior to [redacted] weeks gestation 12/04/2012   Vaginal Pap smear, abnormal     Past Surgical History:  Past Surgical History:  Procedure Laterality Date   CERVICAL BIOPSY  W/ LOOP ELECTRODE EXCISION     COLPOSCOPY  09/03/2019       LEEP  10/22/2019        Allergies:  No Known Allergies  Family History:  Family History  Problem Relation Age of Onset   Hypertension Mother    Breast cancer Maternal Aunt     Social History:  Social History   Tobacco Use   Smoking status: Former    Current packs/day: 0.00    Types: Cigarettes    Quit date: 07/16/2010    Years since quitting: 13.3   Smokeless tobacco: Never  Vaping Use   Vaping status: Never Used  Substance Use Topics   Alcohol use: Not Currently    Comment: Occassional, prior to pregnancy   Drug use: No    Review of symptoms:  Constitutional:  Negative for unexplained weight loss, night sweats, fever, chills ENT:  Negative for nose bleeds, sinus pain, painful swallowing CV:  Negative for chest pain, shortness of breath, exercise intolerance, palpitations, loss of consciousness Resp:  Negative for cough, wheezing, shortness of breath GI:  Negative for nausea, vomiting, diarrhea, bloody stools GU:  Positives noted in HPI; otherwise negative for gross hematuria, dysuria, urinary incontinence Neuro:  Negative for seizures, poor balance, limb weakness, slurred speech Psych:  Negative for lack of energy, depression, anxiety Endocrine:  Negative for polydipsia, polyuria, symptoms of hypoglycemia (dizziness, hunger, sweating) Hematologic:  Negative for anemia, purpura, petechia, prolonged or excessive bleeding, use of anticoagulants  Allergic:  Negative for difficulty breathing or  choking as a result of exposure to anything; no shellfish allergy; no allergic response (rash/itch) to materials, foods  Physical exam: There were no vitals taken for this visit. GENERAL APPEARANCE:  Well appearing, well developed, well nourished, NAD HEENT: Atraumatic, Normocephalic. NECK: Normal appearance LUNGS: Normal inspiratory and expiratory excursion HEART: Regular Rate ABDOMEN: *** EXTREMITIES: Moves all extremities well.  Without clubbing, cyanosis, or edema. NEUROLOGIC:  Alert and oriented x 3, normal gait, CN II-XII grossly intact.  MENTAL STATUS:  Appropriate. SKIN:  Warm, dry and intact.    Results:   I have reviewed prior patient's records  I have reviewed referring/prior physicians records  I have reviewed urinalysis  I have reviewed prior urine cultures  I reviewed prior imaging studies  Assessment: No diagnosis found.   Plan: ***

## 2023-11-14 ENCOUNTER — Ambulatory Visit (INDEPENDENT_AMBULATORY_CARE_PROVIDER_SITE_OTHER): Admitting: Urology

## 2023-11-14 ENCOUNTER — Encounter: Payer: Self-pay | Admitting: Urology

## 2023-11-14 VITALS — BP 121/84 | HR 91 | Ht 65.0 in | Wt 142.0 lb

## 2023-11-14 DIAGNOSIS — Z87440 Personal history of urinary (tract) infections: Secondary | ICD-10-CM | POA: Diagnosis not present

## 2023-11-14 DIAGNOSIS — R3129 Other microscopic hematuria: Secondary | ICD-10-CM | POA: Diagnosis not present

## 2023-11-14 DIAGNOSIS — N390 Urinary tract infection, site not specified: Secondary | ICD-10-CM

## 2023-11-14 LAB — URINALYSIS, ROUTINE W REFLEX MICROSCOPIC
Bilirubin, UA: NEGATIVE
Glucose, UA: NEGATIVE
Ketones, UA: NEGATIVE
Leukocytes,UA: NEGATIVE
Nitrite, UA: NEGATIVE
Protein,UA: NEGATIVE
Specific Gravity, UA: 1.02 (ref 1.005–1.030)
Urobilinogen, Ur: 0.2 mg/dL (ref 0.2–1.0)
pH, UA: 6 (ref 5.0–7.5)

## 2023-11-14 LAB — MICROSCOPIC EXAMINATION

## 2023-11-14 LAB — BLADDER SCAN AMB NON-IMAGING: Scan Result: 40

## 2023-11-14 MED ORDER — FLUCONAZOLE 150 MG PO TABS: 150.0000 mg | ORAL_TABLET | Freq: Once | ORAL | 0 refills | Status: AC

## 2024-01-10 ENCOUNTER — Ambulatory Visit (INDEPENDENT_AMBULATORY_CARE_PROVIDER_SITE_OTHER)

## 2024-01-10 ENCOUNTER — Other Ambulatory Visit (HOSPITAL_COMMUNITY)
Admission: RE | Admit: 2024-01-10 | Discharge: 2024-01-10 | Disposition: A | Discharge status: Home / Self Care | Source: Ambulatory Visit | Attending: Obstetrics and Gynecology | Admitting: Obstetrics and Gynecology

## 2024-01-10 VITALS — BP 116/70 | HR 80

## 2024-01-10 DIAGNOSIS — N898 Other specified noninflammatory disorders of vagina: Secondary | ICD-10-CM | POA: Diagnosis present

## 2024-01-10 DIAGNOSIS — R300 Dysuria: Secondary | ICD-10-CM

## 2024-01-10 LAB — POCT URINALYSIS DIPSTICK
Bilirubin, UA: NEGATIVE
Blood, UA: NEGATIVE
Glucose, UA: NEGATIVE
Ketones, UA: NEGATIVE
Leukocytes, UA: NEGATIVE
Nitrite, UA: NEGATIVE
Odor: NEGATIVE
Protein, UA: NEGATIVE
Spec Grav, UA: 1.02 (ref 1.010–1.025)
Urobilinogen, UA: 0.2 U/dL
pH, UA: 6 (ref 5.0–8.0)

## 2024-01-10 NOTE — Progress Notes (Signed)
..  SUBJECTIVE:  33 y.o. female complains of vaginal discharge and odor for 2 week(s). Denies abnormal vaginal bleeding or significant pelvic pain or fever. Also, reports urinary frequency and burning with urination for 2 weeks. Denies history of known exposure to STD. Pt states that she was previously taking Bactrim  for 2 weeks but completed course and continues to take Hiprex  daily but urinary symptoms have returned. Pt states that she did have appt with urology but has had no improvement with the recurrence of UTI's.  No LMP recorded. (Menstrual status: Lactating).  OBJECTIVE:  She appears well, afebrile. Urine dipstick: negative for all components.  ASSESSMENT:  Vaginal Discharge  Vaginal Odor Burning with urination/frequency   PLAN:  Urine culture, & GC, chlamydia, trichomonas, BVAG, CVAG probe sent to lab. Treatment: To be determined once lab results are received ROV prn if symptoms persist or worsen.

## 2024-01-11 LAB — CERVICOVAGINAL ANCILLARY ONLY
Bacterial Vaginitis (gardnerella): POSITIVE — AB
Candida Glabrata: NEGATIVE
Candida Vaginitis: NEGATIVE
Chlamydia: NEGATIVE
Comment: NEGATIVE
Comment: NEGATIVE
Comment: NEGATIVE
Comment: NEGATIVE
Comment: NEGATIVE
Comment: NORMAL
Neisseria Gonorrhea: NEGATIVE
Trichomonas: POSITIVE — AB

## 2024-01-13 LAB — URINE CULTURE

## 2024-01-14 ENCOUNTER — Ambulatory Visit: Payer: Self-pay | Admitting: Obstetrics and Gynecology

## 2024-01-14 ENCOUNTER — Other Ambulatory Visit: Payer: Self-pay | Admitting: *Deleted

## 2024-01-14 MED ORDER — METRONIDAZOLE 500 MG PO TABS: 500.0000 mg | ORAL_TABLET | Freq: Two times a day (BID) | ORAL | 0 refills | Status: DC

## 2024-01-14 NOTE — Progress Notes (Signed)
 Flagyl  sent in for recent +BV/Trich on self swab.  Pt aware.

## 2024-01-21 ENCOUNTER — Encounter: Payer: Self-pay | Admitting: Obstetrics and Gynecology

## 2024-02-05 ENCOUNTER — Ambulatory Visit (INDEPENDENT_AMBULATORY_CARE_PROVIDER_SITE_OTHER)

## 2024-02-05 ENCOUNTER — Other Ambulatory Visit (HOSPITAL_COMMUNITY)
Admission: RE | Admit: 2024-02-05 | Discharge: 2024-02-05 | Disposition: A | Discharge status: Home / Self Care | Source: Ambulatory Visit | Attending: Obstetrics and Gynecology | Admitting: Obstetrics and Gynecology

## 2024-02-05 VITALS — BP 111/67 | HR 71

## 2024-02-05 DIAGNOSIS — R300 Dysuria: Secondary | ICD-10-CM | POA: Insufficient documentation

## 2024-02-05 LAB — POCT URINALYSIS DIPSTICK
Bilirubin, UA: NEGATIVE
Blood, UA: NEGATIVE
Glucose, UA: NEGATIVE
Protein, UA: NEGATIVE
Spec Grav, UA: 1.02 (ref 1.010–1.025)
Urobilinogen, UA: 0.2 U/dL
pH, UA: 6 (ref 5.0–8.0)

## 2024-02-05 MED ORDER — NITROFURANTOIN MONOHYD MACRO 100 MG PO CAPS: 100.0000 mg | ORAL_CAPSULE | Freq: Two times a day (BID) | ORAL | 0 refills | Status: DC

## 2024-02-05 NOTE — Progress Notes (Signed)
 SUBJECTIVE:  33 y.o. female who presents for TOC.  Denies abnormal vaginal discharge, bleeding or significant pelvic pain. UTI symptoms present. Denies history of known exposure to STD.  No LMP recorded. (Menstrual status: Lactating).  OBJECTIVE:  She appears well.   ASSESSMENT:  STI Screen   PLAN:  Pt offered STI blood screening-not indicated GC, chlamydia, and trichomonas probe sent to lab.  Treatment: To be determined once lab results are received.  Pt follow up as needed.  SUBJECTIVE: Jennifer [name] is a 33 y.o. female who complains of urinary frequency, urgency and dysuria x 7 days, without flank pain, fever, chills, or abnormal vaginal discharge or bleeding.   OBJECTIVE: Appears well, in no apparent distress.  Vital signs are normal. Urine dipstick shows positive for nitrates, positive for leukocytes, and positive for ketones.    ASSESSMENT: Dysuria  PLAN: Treatment per orders.  Call or return to clinic prn if these symptoms worsen or fail to improve as anticipated.

## 2024-02-06 ENCOUNTER — Ambulatory Visit: Payer: Self-pay | Admitting: Obstetrics and Gynecology

## 2024-02-06 DIAGNOSIS — N3000 Acute cystitis without hematuria: Secondary | ICD-10-CM

## 2024-02-06 DIAGNOSIS — R300 Dysuria: Secondary | ICD-10-CM

## 2024-02-06 LAB — CERVICOVAGINAL ANCILLARY ONLY
Bacterial Vaginitis (gardnerella): POSITIVE — AB
Candida Glabrata: NEGATIVE
Candida Vaginitis: NEGATIVE
Chlamydia: NEGATIVE
Comment: NEGATIVE
Comment: NEGATIVE
Comment: NEGATIVE
Comment: NEGATIVE
Comment: NEGATIVE
Comment: NORMAL
Neisseria Gonorrhea: NEGATIVE
Trichomonas: NEGATIVE

## 2024-02-08 LAB — URINE CULTURE

## 2024-02-08 MED ORDER — CEFADROXIL 500 MG PO CAPS: 500.0000 mg | ORAL_CAPSULE | Freq: Two times a day (BID) | ORAL | 0 refills | Status: AC

## 2024-03-18 ENCOUNTER — Other Ambulatory Visit (HOSPITAL_COMMUNITY)
Admission: RE | Admit: 2024-03-18 | Discharge: 2024-03-18 | Disposition: A | Discharge status: Home / Self Care | Source: Ambulatory Visit | Attending: Obstetrics and Gynecology | Admitting: Obstetrics and Gynecology

## 2024-03-18 ENCOUNTER — Ambulatory Visit (INDEPENDENT_AMBULATORY_CARE_PROVIDER_SITE_OTHER)

## 2024-03-18 VITALS — BP 105/69 | HR 81 | Wt 146.5 lb

## 2024-03-18 DIAGNOSIS — N898 Other specified noninflammatory disorders of vagina: Secondary | ICD-10-CM | POA: Insufficient documentation

## 2024-03-18 DIAGNOSIS — R300 Dysuria: Secondary | ICD-10-CM

## 2024-03-18 LAB — POCT URINALYSIS DIPSTICK
Bilirubin, UA: NEGATIVE
Blood, UA: NEGATIVE
Glucose, UA: NEGATIVE
Ketones, UA: NEGATIVE
Nitrite, UA: NEGATIVE
Odor: NEGATIVE
Protein, UA: NEGATIVE
Spec Grav, UA: <=1.005 — AB (ref 1.010–1.025)
Urobilinogen, UA: 0.2 U/dL
pH, UA: 5.5 (ref 5.0–8.0)

## 2024-03-18 LAB — CERVICOVAGINAL ANCILLARY ONLY
Bacterial Vaginitis (gardnerella): POSITIVE — AB
Candida Glabrata: NEGATIVE
Candida Vaginitis: NEGATIVE
Chlamydia: NEGATIVE
Comment: NEGATIVE
Comment: NEGATIVE
Comment: NEGATIVE
Comment: NEGATIVE
Comment: NEGATIVE
Comment: NORMAL
Neisseria Gonorrhea: NEGATIVE
Trichomonas: NEGATIVE

## 2024-03-18 NOTE — Progress Notes (Signed)
..  SUBJECTIVE:  33 y.o. female complains of  vaginal discharge and odor for 2 week(s). Denies abnormal vaginal bleeding or significant pelvic pain or fever. Reports burning with urination. Denies history of known exposure to STD.  No LMP recorded. (Menstrual status: Lactating).  OBJECTIVE:  She appears well, afebrile. Urine dipstick: positive for leukocytes.  ASSESSMENT:  Vaginal Discharge  Vaginal Odor Dysuria    PLAN:  GC, chlamydia, trichomonas, BVAG, CVAG probe and urine culture sent to lab. Treatment: To be determined once lab results are received ROV prn if symptoms persist or worsen. Advised pt to follow back up with urology for ongoing UTI symptoms

## 2024-03-19 ENCOUNTER — Ambulatory Visit: Payer: Self-pay | Admitting: Obstetrics and Gynecology

## 2024-03-19 MED ORDER — METRONIDAZOLE 0.75 % VA GEL: 1.0000 | Freq: Every day | VAGINAL | 0 refills | Status: AC

## 2024-03-20 MED ORDER — METRONIDAZOLE 0.75 % VA GEL: 1.0000 | VAGINAL | 3 refills | Status: AC

## 2024-03-21 LAB — URINE CULTURE

## 2024-03-24 MED ORDER — AMOXICILLIN-POT CLAVULANATE 875-125 MG PO TABS: 1.0000 | ORAL_TABLET | Freq: Two times a day (BID) | ORAL | 0 refills | Status: AC

## 2024-04-27 ENCOUNTER — Encounter: Payer: Self-pay | Admitting: Obstetrics & Gynecology

## 2024-05-02 ENCOUNTER — Other Ambulatory Visit: Payer: Self-pay

## 2024-05-02 MED ORDER — NORETHINDRONE 0.35 MG PO TABS: 1.0000 | ORAL_TABLET | Freq: Every day | ORAL | 0 refills | Status: AC

## 2024-05-07 ENCOUNTER — Ambulatory Visit: Payer: Self-pay

## 2024-05-12 ENCOUNTER — Other Ambulatory Visit (HOSPITAL_COMMUNITY)
Admission: RE | Admit: 2024-05-12 | Discharge: 2024-05-12 | Disposition: A | Discharge status: Home / Self Care | Source: Ambulatory Visit | Attending: Obstetrics and Gynecology | Admitting: Obstetrics and Gynecology

## 2024-05-12 ENCOUNTER — Ambulatory Visit

## 2024-05-12 VITALS — BP 122/78 | HR 71 | Ht 65.0 in | Wt 149.7 lb

## 2024-05-12 DIAGNOSIS — N898 Other specified noninflammatory disorders of vagina: Secondary | ICD-10-CM | POA: Diagnosis not present

## 2024-05-12 DIAGNOSIS — R300 Dysuria: Secondary | ICD-10-CM | POA: Diagnosis not present

## 2024-05-12 LAB — POCT URINALYSIS DIPSTICK
Bilirubin, UA: NEGATIVE
Blood, UA: NEGATIVE
Glucose, UA: NEGATIVE
Ketones, UA: NEGATIVE
Leukocytes, UA: NEGATIVE
Nitrite, UA: NEGATIVE
Protein, UA: NEGATIVE
Spec Grav, UA: 1.02
Urobilinogen, UA: 1 U/dL
pH, UA: 6

## 2024-05-12 NOTE — Progress Notes (Signed)
 SUBJECTIVE:  34 y.o. female complains of clear, foul, and thin vaginal discharge for 3 week(s) and dysuria Denies abnormal vaginal bleeding or significant pelvic pain or fever.Denies history of known exposure to STD. Pt had ordered BV treatment for her partner but it was never delivered. Pt is requesting if Dr. Erik can send treatment for partner as was discussed before. Pt did follow up w Urology and was advised no issues from their standpoint.  Patient's last menstrual period was 05/06/2024 (exact date).  OBJECTIVE:  She appears alert, well appearing, in no apparent distress Urine dipstick: negative for all components. Culture not ordered.   ASSESSMENT:  Vaginal Discharge  Vaginal Odor Dysuria    PLAN:  GC, chlamydia, trichomonas, BVAG, CVAG probe sent to lab. Treatment: To be determined once lab results are received Pt to return to office 1/27 for scheduled visit w provider due to ongoing issues

## 2024-05-13 ENCOUNTER — Ambulatory Visit: Payer: Self-pay | Admitting: Obstetrics and Gynecology

## 2024-05-13 LAB — CERVICOVAGINAL ANCILLARY ONLY
Bacterial Vaginitis (gardnerella): POSITIVE — AB
Candida Glabrata: NEGATIVE
Candida Vaginitis: NEGATIVE
Chlamydia: NEGATIVE
Comment: NEGATIVE
Comment: NEGATIVE
Comment: NEGATIVE
Comment: NEGATIVE
Comment: NEGATIVE
Comment: NORMAL
Neisseria Gonorrhea: NEGATIVE
Trichomonas: NEGATIVE

## 2024-05-13 MED ORDER — METRONIDAZOLE 500 MG PO TABS: 500.0000 mg | ORAL_TABLET | Freq: Two times a day (BID) | ORAL | 0 refills | Status: DC

## 2024-05-15 ENCOUNTER — Other Ambulatory Visit: Payer: Self-pay | Admitting: Obstetrics and Gynecology

## 2024-05-15 MED ORDER — METRONIDAZOLE 500 MG PO TABS: 500.0000 mg | ORAL_TABLET | Freq: Two times a day (BID) | ORAL | 0 refills | Status: AC

## 2024-05-27 ENCOUNTER — Ambulatory Visit: Admitting: Obstetrics and Gynecology

## 2024-06-11 ENCOUNTER — Ambulatory Visit: Payer: Self-pay

## 2024-06-18 ENCOUNTER — Ambulatory Visit: Admitting: Obstetrics & Gynecology
